# Patient Record
Sex: Male | Born: 1940 | Race: Black or African American | Hispanic: No | Marital: Married | Smoking: Former smoker
Health system: Southern US, Community
[De-identification: ages and names within clinical notes are randomized; demographics above are authoritative.]

## PROBLEM LIST (undated history)

## (undated) DIAGNOSIS — J8410 Pulmonary fibrosis, unspecified: Secondary | ICD-10-CM

## (undated) DIAGNOSIS — I5032 Chronic diastolic (congestive) heart failure: Secondary | ICD-10-CM

## (undated) DIAGNOSIS — Z9981 Dependence on supplemental oxygen: Secondary | ICD-10-CM

## (undated) DIAGNOSIS — E119 Type 2 diabetes mellitus without complications: Secondary | ICD-10-CM

## (undated) DIAGNOSIS — J189 Pneumonia, unspecified organism: Secondary | ICD-10-CM

## (undated) DIAGNOSIS — J449 Chronic obstructive pulmonary disease, unspecified: Secondary | ICD-10-CM

## (undated) DIAGNOSIS — I10 Essential (primary) hypertension: Secondary | ICD-10-CM

## (undated) DIAGNOSIS — E782 Mixed hyperlipidemia: Secondary | ICD-10-CM

## (undated) DIAGNOSIS — I509 Heart failure, unspecified: Secondary | ICD-10-CM

## (undated) DIAGNOSIS — I4891 Unspecified atrial fibrillation: Secondary | ICD-10-CM

## (undated) HISTORY — PX: HERNIA REPAIR: SHX51

## (undated) HISTORY — PX: EYE SURGERY: SHX253

---

## 2013-11-04 ENCOUNTER — Emergency Department (HOSPITAL_COMMUNITY): Payer: Medicare Other

## 2013-11-04 ENCOUNTER — Inpatient Hospital Stay (HOSPITAL_COMMUNITY)
Admission: EM | Admit: 2013-11-04 | Discharge: 2013-11-07 | DRG: 293 | Disposition: A | Discharge status: Home / Self Care | Payer: Medicare Other | Attending: Internal Medicine | Admitting: Internal Medicine

## 2013-11-04 ENCOUNTER — Encounter (HOSPITAL_COMMUNITY): Payer: Self-pay | Admitting: Emergency Medicine

## 2013-11-04 DIAGNOSIS — IMO0001 Reserved for inherently not codable concepts without codable children: Secondary | ICD-10-CM | POA: Diagnosis present

## 2013-11-04 DIAGNOSIS — R0902 Hypoxemia: Secondary | ICD-10-CM | POA: Diagnosis present

## 2013-11-04 DIAGNOSIS — I10 Essential (primary) hypertension: Secondary | ICD-10-CM | POA: Diagnosis present

## 2013-11-04 DIAGNOSIS — I4891 Unspecified atrial fibrillation: Secondary | ICD-10-CM | POA: Diagnosis present

## 2013-11-04 DIAGNOSIS — J811 Chronic pulmonary edema: Secondary | ICD-10-CM | POA: Diagnosis present

## 2013-11-04 DIAGNOSIS — E7800 Pure hypercholesterolemia, unspecified: Secondary | ICD-10-CM | POA: Diagnosis present

## 2013-11-04 DIAGNOSIS — E1165 Type 2 diabetes mellitus with hyperglycemia: Secondary | ICD-10-CM

## 2013-11-04 DIAGNOSIS — E782 Mixed hyperlipidemia: Secondary | ICD-10-CM | POA: Diagnosis present

## 2013-11-04 DIAGNOSIS — I509 Heart failure, unspecified: Secondary | ICD-10-CM | POA: Diagnosis present

## 2013-11-04 DIAGNOSIS — Z833 Family history of diabetes mellitus: Secondary | ICD-10-CM

## 2013-11-04 DIAGNOSIS — I5033 Acute on chronic diastolic (congestive) heart failure: Principal | ICD-10-CM | POA: Diagnosis present

## 2013-11-04 HISTORY — DX: Type 2 diabetes mellitus without complications: E11.9

## 2013-11-04 HISTORY — DX: Unspecified atrial fibrillation: I48.91

## 2013-11-04 HISTORY — DX: Essential (primary) hypertension: I10

## 2013-11-04 HISTORY — DX: Mixed hyperlipidemia: E78.2

## 2013-11-04 LAB — CBC WITH DIFFERENTIAL/PLATELET
BASOS PCT: 0 % (ref 0–1)
Basophils Absolute: 0 10*3/uL (ref 0.0–0.1)
EOS ABS: 0.1 10*3/uL (ref 0.0–0.7)
Eosinophils Relative: 1 % (ref 0–5)
HCT: 44 % (ref 39.0–52.0)
Hemoglobin: 14.9 g/dL (ref 13.0–17.0)
LYMPHS ABS: 1.5 10*3/uL (ref 0.7–4.0)
Lymphocytes Relative: 20 % (ref 12–46)
MCH: 32.5 pg (ref 26.0–34.0)
MCHC: 33.9 g/dL (ref 30.0–36.0)
MCV: 96.1 fL (ref 78.0–100.0)
Monocytes Absolute: 0.5 10*3/uL (ref 0.1–1.0)
Monocytes Relative: 7 % (ref 3–12)
NEUTROS ABS: 5.5 10*3/uL (ref 1.7–7.7)
NEUTROS PCT: 72 % (ref 43–77)
PLATELETS: 162 10*3/uL (ref 150–400)
RBC: 4.58 MIL/uL (ref 4.22–5.81)
RDW: 14.7 % (ref 11.5–15.5)
WBC: 7.6 10*3/uL (ref 4.0–10.5)

## 2013-11-04 LAB — PROTIME-INR
INR: 1.76 — ABNORMAL HIGH (ref 0.00–1.49)
Prothrombin Time: 20 seconds — ABNORMAL HIGH (ref 11.6–15.2)

## 2013-11-04 LAB — COMPREHENSIVE METABOLIC PANEL
ALBUMIN: 3.6 g/dL (ref 3.5–5.2)
ALK PHOS: 65 U/L (ref 39–117)
ALT: 64 U/L — AB (ref 0–53)
AST: 41 U/L — ABNORMAL HIGH (ref 0–37)
BUN: 18 mg/dL (ref 6–23)
CHLORIDE: 101 meq/L (ref 96–112)
CO2: 24 mEq/L (ref 19–32)
Calcium: 9.3 mg/dL (ref 8.4–10.5)
Creatinine, Ser: 1.1 mg/dL (ref 0.50–1.35)
GFR calc Af Amer: 74 mL/min — ABNORMAL LOW (ref >90)
GFR calc non Af Amer: 64 mL/min — ABNORMAL LOW (ref >90)
Glucose, Bld: 113 mg/dL — ABNORMAL HIGH (ref 70–99)
POTASSIUM: 4.2 meq/L (ref 3.7–5.3)
SODIUM: 138 meq/L (ref 137–147)
Total Bilirubin: 1.3 mg/dL — ABNORMAL HIGH (ref 0.3–1.2)
Total Protein: 7.5 g/dL (ref 6.0–8.3)

## 2013-11-04 LAB — URINALYSIS, ROUTINE W REFLEX MICROSCOPIC
BILIRUBIN URINE: NEGATIVE
Glucose, UA: NEGATIVE mg/dL
Ketones, ur: NEGATIVE mg/dL
NITRITE: NEGATIVE
PH: 5.5 (ref 5.0–8.0)
Protein, ur: 30 mg/dL — AB
Specific Gravity, Urine: 1.02 (ref 1.005–1.030)
UROBILINOGEN UA: 0.2 mg/dL (ref 0.0–1.0)

## 2013-11-04 LAB — GLUCOSE, CAPILLARY: GLUCOSE-CAPILLARY: 169 mg/dL — AB (ref 70–99)

## 2013-11-04 LAB — URINE MICROSCOPIC-ADD ON

## 2013-11-04 LAB — PRO B NATRIURETIC PEPTIDE: PRO B NATRI PEPTIDE: 4838 pg/mL — AB (ref 0–125)

## 2013-11-04 LAB — TROPONIN I: Troponin I: <0.3 ng/mL (ref <0.30)

## 2013-11-04 MED ORDER — SODIUM CHLORIDE 0.9 % IJ SOLN
3.0000 mL | Freq: Two times a day (BID) | INTRAMUSCULAR | Status: DC
  Administered 2013-11-05 – 2013-11-07 (×3): 3 mL via INTRAVENOUS

## 2013-11-04 MED ORDER — LATANOPROST 0.005 % OP SOLN
1.0000 [drp] | Freq: Every day | OPHTHALMIC | Status: DC
  Administered 2013-11-04 – 2013-11-06 (×3): 1 [drp] via OPHTHALMIC
  Filled 2013-11-04: qty 2.5

## 2013-11-04 MED ORDER — LATANOPROST 0.005 % OP SOLN
OPHTHALMIC | Status: AC
  Filled 2013-11-04: qty 2.5

## 2013-11-04 MED ORDER — APIXABAN 5 MG PO TABS
5.0000 mg | ORAL_TABLET | Freq: Two times a day (BID) | ORAL | Status: DC
  Administered 2013-11-05 – 2013-11-07 (×5): 5 mg via ORAL
  Filled 2013-11-04 (×5): qty 1

## 2013-11-04 MED ORDER — SODIUM CHLORIDE 0.9 % IJ SOLN: 3.0000 mL | INTRAMUSCULAR | Status: DC | PRN

## 2013-11-04 MED ORDER — FUROSEMIDE 10 MG/ML IJ SOLN
40.0000 mg | Freq: Every day | INTRAMUSCULAR | Status: DC
  Administered 2013-11-05 – 2013-11-06 (×2): 40 mg via INTRAVENOUS
  Filled 2013-11-04 (×2): qty 4

## 2013-11-04 MED ORDER — FUROSEMIDE 10 MG/ML IJ SOLN
80.0000 mg | Freq: Once | INTRAMUSCULAR | Status: AC
  Administered 2013-11-04: 80 mg via INTRAVENOUS
  Filled 2013-11-04: qty 8

## 2013-11-04 MED ORDER — ACETAMINOPHEN 325 MG PO TABS
650.0000 mg | ORAL_TABLET | ORAL | Status: DC | PRN
  Administered 2013-11-06: 650 mg via ORAL
  Filled 2013-11-04: qty 2

## 2013-11-04 MED ORDER — METOPROLOL TARTRATE 25 MG PO TABS
25.0000 mg | ORAL_TABLET | Freq: Two times a day (BID) | ORAL | Status: DC
  Administered 2013-11-05 – 2013-11-07 (×5): 25 mg via ORAL
  Filled 2013-11-04 (×5): qty 1

## 2013-11-04 MED ORDER — ATORVASTATIN CALCIUM 10 MG PO TABS
10.0000 mg | ORAL_TABLET | Freq: Every day | ORAL | Status: DC
  Administered 2013-11-05 – 2013-11-06 (×2): 10 mg via ORAL
  Filled 2013-11-04 (×2): qty 1

## 2013-11-04 MED ORDER — INSULIN ASPART 100 UNIT/ML ~~LOC~~ SOLN
0.0000 [IU] | Freq: Three times a day (TID) | SUBCUTANEOUS | Status: DC
  Administered 2013-11-05: 2 [IU] via SUBCUTANEOUS
  Administered 2013-11-06: 1 [IU] via SUBCUTANEOUS
  Administered 2013-11-06: 2 [IU] via SUBCUTANEOUS
  Administered 2013-11-07 (×2): 1 [IU] via SUBCUTANEOUS

## 2013-11-04 MED ORDER — ASPIRIN EC 81 MG PO TBEC
81.0000 mg | DELAYED_RELEASE_TABLET | Freq: Every day | ORAL | Status: DC
  Administered 2013-11-05 – 2013-11-07 (×3): 81 mg via ORAL
  Filled 2013-11-04 (×3): qty 1

## 2013-11-04 MED ORDER — ENALAPRIL MALEATE 5 MG PO TABS
20.0000 mg | ORAL_TABLET | Freq: Two times a day (BID) | ORAL | Status: DC
  Administered 2013-11-05 – 2013-11-07 (×5): 20 mg via ORAL
  Filled 2013-11-04 (×5): qty 4

## 2013-11-04 MED ORDER — GABAPENTIN 300 MG PO CAPS
900.0000 mg | ORAL_CAPSULE | Freq: Every day | ORAL | Status: DC
  Filled 2013-11-04 (×2): qty 3

## 2013-11-04 MED ORDER — ONDANSETRON HCL 4 MG/2ML IJ SOLN: 4.0000 mg | Freq: Four times a day (QID) | INTRAMUSCULAR | Status: DC | PRN

## 2013-11-04 MED ORDER — SODIUM CHLORIDE 0.9 % IV SOLN: 250.0000 mL | INTRAVENOUS | Status: DC | PRN

## 2013-11-04 MED ORDER — SODIUM CHLORIDE 0.9 % IV SOLN
INTRAVENOUS | Status: DC
  Administered 2013-11-04: 15:00:00 via INTRAVENOUS

## 2013-11-04 NOTE — ED Provider Notes (Addendum)
CSN: [id number]     Arrival date & time 11/04/13  1334 History  This chart was scribed for Shelda Jakes, MD by Danella Maiers, ED Scribe. This patient was seen in room APA19/APA19 and the patient's care was started at 1:49 PM.    Chief Complaint  Patient presents with  . Shortness of Breath   Patient is a 73 y.o. male presenting with shortness of breath. The history is provided by the patient. No language interpreter was used.  Shortness of Breath Severity:  Moderate Onset quality:  Gradual Chronicity:  New Context: activity   Associated symptoms: cough   Associated symptoms: no abdominal pain, no chest pain, no fever, no headaches, no neck pain, no rash, no sore throat and no vomiting    HPI Comments: Draylon Mercadel is a 73 y.o. male who presents to the Emergency Department sent by PCP complaining of increasing SOB and weakness. Pt was treated for pneumonia with Augmentin for 7 days then azithromycin for 5 days. He went to PCP today because he feels tired and has been getting SOB easily, they did a CXR, and sent him here because his ox sats were not above 86% and his CXR looked worse today. He reports productive cough with streaks of blood. He was given a incentive spirometer days ago which has increased production from the cough. He reports feeling like he is wheezing at night. He is on Elaquis. He is not on oxygen at home.     PCP - Dr Ferdie Ping in [place]   Past Medical History  Diagnosis Date  . Hypertension   . Diabetes mellitus without complication   . Atrial fibrillation   . High cholesterol    Past Surgical History  Procedure Laterality Date  . Hernia repair     No family history on file. History  Substance Use Topics  . Smoking status: Never Smoker   . Smokeless tobacco: Not on file  . Alcohol Use: Yes     Comment: occasional    Review of Systems  Constitutional: Negative for fever and chills.  HENT: Positive for rhinorrhea. Negative for sore  throat.   Eyes: Negative for visual disturbance.  Respiratory: Positive for cough and shortness of breath.   Cardiovascular: Negative for chest pain and leg swelling.  Gastrointestinal: Negative for nausea, vomiting, abdominal pain and diarrhea.  Genitourinary: Negative for dysuria and hematuria.  Musculoskeletal: Negative for back pain and neck pain.  Skin: Negative for rash.  Neurological: Negative for headaches.  Hematological: Does not bruise/bleed easily.  Psychiatric/Behavioral: Negative for confusion.  All other systems reviewed and are negative.    Allergies  Review of patient's allergies indicates no known allergies.  Home Medications   Current Outpatient Rx  Name  Route  Sig  Dispense  Refill  . apixaban (ELIQUIS) 5 MG TABS tablet   Oral   Take 5 mg by mouth 2 (two) times daily.         Marland Kitchen aspirin EC 81 MG tablet   Oral   Take 81 mg by mouth daily.         Marland Kitchen atorvastatin (LIPITOR) 20 MG tablet   Oral   Take 10 mg by mouth at bedtime.         . gabapentin (NEURONTIN) 300 MG capsule   Oral   Take 900 mg by mouth at bedtime.         Marland Kitchen latanoprost (XALATAN) 0.005 % ophthalmic solution   Left Eye   Place  1 drop into the left eye at bedtime.         Marland Kitchen lisinopril (PRINIVIL,ZESTRIL) 20 MG tablet   Oral   Take 20 mg by mouth 2 (two) times daily.         . metoprolol (LOPRESSOR) 50 MG tablet   Oral   Take 25 mg by mouth 2 (two) times daily. Takes with Lisinopril         . tadalafil (CIALIS) 20 MG tablet   Oral   Take 20 mg by mouth daily as needed for erectile dysfunction.          BP 149/86  Pulse 78  Temp(Src) 97.5 F (36.4 C)  Resp 22  Ht 5\' 11"  (1.803 m)  Wt 182 lb (82.555 kg)  BMI 25.40 kg/m2  SpO2 92% Physical Exam  Nursing note and vitals reviewed. Constitutional: He is oriented to person, place, and time. He appears well-developed and well-nourished. No distress.  HENT:  Head: Normocephalic and atraumatic.  Eyes: EOM are  normal. Pupils are equal, round, and reactive to light.  Neck: Neck supple. No tracheal deviation present.  Cardiovascular: Normal rate and regular rhythm.   Pulmonary/Chest: Effort normal and breath sounds normal. No respiratory distress.  Abdominal: Soft. Bowel sounds are normal. There is no tenderness.  Musculoskeletal: Normal range of motion.  Neurological: He is alert and oriented to person, place, and time. No cranial nerve deficit.  Skin: Skin is warm and dry.  Psychiatric: He has a normal mood and affect. His behavior is normal.    ED Course  Procedures (including critical care time) Medications  0.9 %  sodium chloride infusion ( Intravenous New Bag/Given 11/04/13 1526)  furosemide (LASIX) injection 80 mg (80 mg Intravenous Given 11/04/13 1555)    DIAGNOSTIC STUDIES: Oxygen Saturation is 92% on RA, adequate by my interpretation.    COORDINATION OF CARE: 2:06 PM- On 2L he is around 96%. Discussed treatment plan with pt. Pt agrees to plan.    Labs Review Labs Reviewed  PROTIME-INR - Abnormal; Notable for the following:    Prothrombin Time 20.0 (*)    INR 1.76 (*)    All other components within normal limits  PRO B NATRIURETIC PEPTIDE - Abnormal; Notable for the following:    Pro B Natriuretic peptide (BNP) 4838.0 (*)    All other components within normal limits  URINALYSIS, ROUTINE W REFLEX MICROSCOPIC - Abnormal; Notable for the following:    Hgb urine dipstick MODERATE (*)    Protein, ur 30 (*)    Leukocytes, UA TRACE (*)    All other components within normal limits  COMPREHENSIVE METABOLIC PANEL - Abnormal; Notable for the following:    Glucose, Bld 113 (*)    AST 41 (*)    ALT 64 (*)    Total Bilirubin 1.3 (*)    GFR calc non Af Amer 64 (*)    GFR calc Af Amer 74 (*)    All other components within normal limits  TROPONIN I  CBC WITH DIFFERENTIAL  URINE MICROSCOPIC-ADD ON   Results for orders placed during the hospital encounter of 11/04/13  PROTIME-INR       Result Value Range   Prothrombin Time 20.0 (*) 11.6 - 15.2 seconds   INR 1.76 (*) 0.00 - 1.49  PRO B NATRIURETIC PEPTIDE      Result Value Range   Pro B Natriuretic peptide (BNP) 4838.0 (*) 0 - 125 pg/mL  TROPONIN I      Result  Value Range   Troponin I <0.30  <0.30 ng/mL  URINALYSIS, ROUTINE W REFLEX MICROSCOPIC      Result Value Range   Color, Urine YELLOW  YELLOW   APPearance CLEAR  CLEAR   Specific Gravity, Urine 1.020  1.005 - 1.030   pH 5.5  5.0 - 8.0   Glucose, UA NEGATIVE  NEGATIVE mg/dL   Hgb urine dipstick MODERATE (*) NEGATIVE   Bilirubin Urine NEGATIVE  NEGATIVE   Ketones, ur NEGATIVE  NEGATIVE mg/dL   Protein, ur 30 (*) NEGATIVE mg/dL   Urobilinogen, UA 0.2  0.0 - 1.0 mg/dL   Nitrite NEGATIVE  NEGATIVE   Leukocytes, UA TRACE (*) NEGATIVE  CBC WITH DIFFERENTIAL      Result Value Range   WBC 7.6  4.0 - 10.5 K/uL   RBC 4.58  4.22 - 5.81 MIL/uL   Hemoglobin 14.9  13.0 - 17.0 g/dL   HCT 16.144.0  09.639.0 - 04.552.0 %   MCV 96.1  78.0 - 100.0 fL   MCH 32.5  26.0 - 34.0 pg   MCHC 33.9  30.0 - 36.0 g/dL   RDW 40.914.7  81.111.5 - 91.415.5 %   Platelets 162  150 - 400 K/uL   Neutrophils Relative % 72  43 - 77 %   Neutro Abs 5.5  1.7 - 7.7 K/uL   Lymphocytes Relative 20  12 - 46 %   Lymphs Abs 1.5  0.7 - 4.0 K/uL   Monocytes Relative 7  3 - 12 %   Monocytes Absolute 0.5  0.1 - 1.0 K/uL   Eosinophils Relative 1  0 - 5 %   Eosinophils Absolute 0.1  0.0 - 0.7 K/uL   Basophils Relative 0  0 - 1 %   Basophils Absolute 0.0  0.0 - 0.1 K/uL  COMPREHENSIVE METABOLIC PANEL      Result Value Range   Sodium 138  137 - 147 mEq/L   Potassium 4.2  3.7 - 5.3 mEq/L   Chloride 101  96 - 112 mEq/L   CO2 24  19 - 32 mEq/L   Glucose, Bld 113 (*) 70 - 99 mg/dL   BUN 18  6 - 23 mg/dL   Creatinine, Ser 7.821.10  0.50 - 1.35 mg/dL   Calcium 9.3  8.4 - 95.610.5 mg/dL   Total Protein 7.5  6.0 - 8.3 g/dL   Albumin 3.6  3.5 - 5.2 g/dL   AST 41 (*) 0 - 37 U/L   ALT 64 (*) 0 - 53 U/L   Alkaline Phosphatase 65  39 -  117 U/L   Total Bilirubin 1.3 (*) 0.3 - 1.2 mg/dL   GFR calc non Af Amer 64 (*) >90 mL/min   GFR calc Af Amer 74 (*) >90 mL/min  URINE MICROSCOPIC-ADD ON      Result Value Range   Squamous Epithelial / LPF RARE  RARE   WBC, UA 3-6  <3 WBC/hpf   RBC / HPF 3-6  <3 RBC/hpf   Bacteria, UA RARE  RARE    Imaging Review Dg Chest Port 1 View  11/04/2013   CLINICAL DATA:  Fatigue.  Chronic, productive cough.  EXAM: PORTABLE CHEST - 1 VIEW  COMPARISON:  PA and lateral chest 11/04/2013 and 10/21/2013.  FINDINGS: Bilateral interstitial opacities have worsened since the most recent study. There is cardiomegaly. There are small bilateral pleural effusions.  IMPRESSION: Worsened aeration most compatible with increased pulmonary edema possibly superimposed on chronic interstitial lung disease.  Electronically Signed   By: Drusilla Kanner M.D.   On: 11/04/2013 15:13    EKG Interpretation   None      Date: 11/04/2013  Rate: 77  Rhythm: atrial fibrillation  QRS Axis: normal  Intervals: normal  ST/T Wave abnormalities: nonspecific ST/T changes  Conduction Disutrbances:none  Narrative Interpretation:   Old EKG Reviewed: none available    MDM   1. Pulmonary edema    The patient sent from a doctor's office and sent in by EMS for persistent exertional shortness of breath and fatigue. Workup consistent with pulmonary edema. A cardiac source patient with long-standing history of hypertension history of atrial fib but controlled. Patient is on the [facility] was for a blood thinner. Patient blood pressure a little marginal but not terrible 149/86. On room air patient was satting in the low 80s. Arrived on 4 L. Able to titrate him down to 2 L satting in the 92% or greater level on that. Patient feels comfortable on the oxygen patient given 80 mg of Lasix with diuresis. Troponin negative EKG without acute cardiac findings. Patient also has a history of diabetes blood sugar well controlled here.   Patient  primary care Dr. had assumed it may be related to pneumonia patient had been on Augmentin and Zithromax. Patient also states he had CT of the chest done in [place]. Results for that not known. Patient with no prior history of CHF or pulmonary edema.   Discussed with hospitalist for admission.  I personally performed the services described in this documentation, which was scribed in my presence. The recorded information has been reviewed and is accurate.     Shelda Jakes, MD 11/04/13 1642  Shelda Jakes, MD 11/04/13 [phone]

## 2013-11-04 NOTE — ED Notes (Signed)
Pt c/o SOB and fatigue "for awhile". Pt states "when I get up to do something I just get really tired". Pt denies chest pain.

## 2013-11-04 NOTE — ED Notes (Signed)
Pt sent by pcp increasing sob and weakness. Pt treated for pne with po augmentin and azithromycin with no relief and worsening chest xray.

## 2013-11-04 NOTE — H&P (Signed)
[facility] History and Physical  [name] Sherman NWG:[id number] DOB: 1941-01-16 DOA: 11/04/2013  Referring physician: EDP PCP: [name], PA-C   Chief Complaint: easily fatigued on exertion.  HPI: [name] Sherman is a 73 y.o. male with prior h/o hypertension, DM, atrial fib on eliquis, came in for some sob, and easy fatigability on mild exertion. On arrival to ED, he was found to have pulm edema and hypoxic. Marland Kitchen He waas given 80 mg of IV lasix and referred to hospitalist service for admission.  He denies any other complaints.    Review of Systems:  Constitutional:  No weight loss, night sweats, Fevers, chills, fatigue.  HEENT:  No headaches, Difficulty swallowing,Tooth/dental problems,Sore throat,  No sneezing, itching, ear ache, nasal congestion, post nasal drip,  Cardio-vascular:  No chest pain, Orthopnea, PND, swelling in lower extremities, anasarca, dizziness, palpitations  GI:  No heartburn, indigestion, abdominal pain, nausea, vomiting, diarrhea, change in bowel habits, loss of appetite  Resp:  Positive for SOB on exertion.  No excess mucus, no productive cough, No non-productive cough, No coughing up of blood.No change in color of mucus.No wheezing.No chest wall deformity  Skin:  no rash or lesions.  GU:  no dysuria, change in color of urine, no urgency or frequency. No flank pain.  Musculoskeletal:  No joint pain or swelling. No decreased range of motion. No back pain.  Psych:  No change in mood or affect. No depression or anxiety. No memory loss.   Past Medical History  Diagnosis Date  . Hypertension   . Diabetes mellitus without complication   . Atrial fibrillation   . High cholesterol    Past Surgical History  Procedure Laterality Date  . Hernia repair     Social History:  reports that he has never smoked. He does not have any smokeless tobacco history on file. He reports that he drinks alcohol. He reports that he does not use illicit drugs.  No Known  Allergies  No family history on file.   Prior to Admission medications   Medication Sig Start Date End Date Taking? Authorizing Provider  apixaban (ELIQUIS) 5 MG TABS tablet Take 5 mg by mouth 2 (two) times daily.   Yes Historical Provider, MD  aspirin EC 81 MG tablet Take 81 mg by mouth daily.   Yes Historical Provider, MD  atorvastatin (LIPITOR) 20 MG tablet Take 10 mg by mouth at bedtime.   Yes Historical Provider, MD  gabapentin (NEURONTIN) 300 MG capsule Take 900 mg by mouth at bedtime.   Yes Historical Provider, MD  latanoprost (XALATAN) 0.005 % ophthalmic solution Place 1 drop into the left eye at bedtime.   Yes Historical Provider, MD  lisinopril (PRINIVIL,ZESTRIL) 20 MG tablet Take 20 mg by mouth 2 (two) times daily.   Yes Historical Provider, MD  metoprolol (LOPRESSOR) 50 MG tablet Take 25 mg by mouth 2 (two) times daily. Takes with Lisinopril   Yes Historical Provider, MD  tadalafil (CIALIS) 20 MG tablet Take 20 mg by mouth daily as needed for erectile dysfunction.   Yes Historical Provider, MD   Physical Exam: Filed Vitals:   11/04/13 1555  BP: 149/97  Pulse: 69  Temp:   Resp: 19    BP 149/97  Pulse 69  Temp(Src) 97.5 F (36.4 C)  Resp 19  Ht 5\' 11"  (1.803 m)  Wt 82.555 kg (182 lb)  BMI 25.40 kg/m2  SpO2 95%  General:  Appears calm and comfortable Eyes: PERRL, normal lids, irises & conjunctiva  ENT: grossly normal hearing, lips & tongue Neck: no LAD, masses or thyromegaly Cardiovascular: RRR, no m/r/g. No LE edema. Respiratory: CTA bilaterally, no w/r/r. Normal respiratory effort. Abdomen: soft, ntnd Skin: no rash or induration seen on limited exam Musculoskeletal: grossly normal tone BUE/BLE Psychiatric: grossly normal mood and affect, speech fluent and appropriate Neurologic: grossly non-focal.          Labs on Admission:  Basic Metabolic Panel:  Recent Labs Lab 11/04/13 1457  NA 138  K 4.2  CL 101  CO2 24  GLUCOSE 113*  BUN 18  CREATININE 1.10   CALCIUM 9.3   Liver Function Tests:  Recent Labs Lab 11/04/13 1457  AST 41*  ALT 64*  ALKPHOS 65  BILITOT 1.3*  PROT 7.5  ALBUMIN 3.6   No results found for this basename: LIPASE, AMYLASE,  in the last 168 hours No results found for this basename: AMMONIA,  in the last 168 hours CBC:  Recent Labs Lab 11/04/13 1457  WBC 7.6  NEUTROABS 5.5  HGB 14.9  HCT 44.0  MCV 96.1  PLT 162   Cardiac Enzymes:  Recent Labs Lab 11/04/13 1457  TROPONINI <0.30    BNP (last 3 results)  Recent Labs  11/04/13 1457  PROBNP 4838.0*   CBG: No results found for this basename: GLUCAP,  in the last 168 hours  Radiological Exams on Admission: Dg Chest Port 1 View  11/04/2013   CLINICAL DATA:  Fatigue.  Chronic, productive cough.  EXAM: PORTABLE CHEST - 1 VIEW  COMPARISON:  PA and lateral chest 11/04/2013 and 10/21/2013.  FINDINGS: Bilateral interstitial opacities have worsened since the most recent study. There is cardiomegaly. There are small bilateral pleural effusions.  IMPRESSION: Worsened aeration most compatible with increased pulmonary edema possibly superimposed on chronic interstitial lung disease.   Electronically Signed   By: Drusilla Kannerhomas  Dalessio M.D.   On: 11/04/2013 15:13    EKG: afib at 77 with t wave inversions.  Assessment/Plan Active Problems:   Pulmonary edema   CHF (congestive heart failure)  1. Pulmonary edema; of unclear etiology / easy fatigability: . Elevated pro bnp of 4838.  - ADMITTED to telemetry - evaluate for CHF, echo, serial troponins, EKG in am.  - EKG shows chronic atrial fib , rate controlled.  - if echo abn will call cardiology in am.  - oxygen to keep sats>90%  2. Chronic atrial fibrillation: - rate controlled - on eliquis.   3. Diabetes Mellitus: - diet controlled.  - hgba1c  SSI  4.  Hypertension: - controlled.  - resume metoprolol, lisinopril.    DVT prophylaxis On eliquis.   Code Status: full code Family Communication: none  atbedside, wife in SNF, discussed the plan of care with th epatient.  Disposition Plan: pending.   Time spent: 65 min  [facility] County[facility]UL[facility],Adel Neyer [facility] Pager [phone]

## 2013-11-05 ENCOUNTER — Inpatient Hospital Stay (HOSPITAL_COMMUNITY): Payer: Medicare Other

## 2013-11-05 DIAGNOSIS — I4891 Unspecified atrial fibrillation: Secondary | ICD-10-CM

## 2013-11-05 DIAGNOSIS — I10 Essential (primary) hypertension: Secondary | ICD-10-CM

## 2013-11-05 DIAGNOSIS — I359 Nonrheumatic aortic valve disorder, unspecified: Secondary | ICD-10-CM

## 2013-11-05 LAB — GLUCOSE, CAPILLARY
GLUCOSE-CAPILLARY: 84 mg/dL (ref 70–99)
GLUCOSE-CAPILLARY: 133 mg/dL — AB (ref 70–99)
Glucose-Capillary: 120 mg/dL — ABNORMAL HIGH (ref 70–99)
Glucose-Capillary: 174 mg/dL — ABNORMAL HIGH (ref 70–99)

## 2013-11-05 LAB — BASIC METABOLIC PANEL
BUN: 21 mg/dL (ref 6–23)
CO2: 26 meq/L (ref 19–32)
Calcium: 9.3 mg/dL (ref 8.4–10.5)
Chloride: 100 mEq/L (ref 96–112)
Creatinine, Ser: 1.16 mg/dL (ref 0.50–1.35)
GFR calc Af Amer: 70 mL/min — ABNORMAL LOW (ref >90)
GFR calc non Af Amer: 60 mL/min — ABNORMAL LOW (ref >90)
Glucose, Bld: 113 mg/dL — ABNORMAL HIGH (ref 70–99)
POTASSIUM: 4.2 meq/L (ref 3.7–5.3)
SODIUM: 139 meq/L (ref 137–147)

## 2013-11-05 LAB — CBC
HEMATOCRIT: 44.2 % (ref 39.0–52.0)
Hemoglobin: 15.2 g/dL (ref 13.0–17.0)
MCH: 33.2 pg (ref 26.0–34.0)
MCHC: 34.4 g/dL (ref 30.0–36.0)
MCV: 96.5 fL (ref 78.0–100.0)
Platelets: 166 10*3/uL (ref 150–400)
RBC: 4.58 MIL/uL (ref 4.22–5.81)
RDW: 14.7 % (ref 11.5–15.5)
WBC: 6.2 10*3/uL (ref 4.0–10.5)

## 2013-11-05 LAB — TROPONIN I
Troponin I: <0.3 ng/mL (ref <0.30)
Troponin I: <0.3 ng/mL (ref <0.30)

## 2013-11-05 LAB — HEMOGLOBIN A1C
HEMOGLOBIN A1C: 6.2 % — AB (ref <5.7)
MEAN PLASMA GLUCOSE: 131 mg/dL — AB (ref <117)

## 2013-11-05 LAB — PRO B NATRIURETIC PEPTIDE: Pro B Natriuretic peptide (BNP): 2570 pg/mL — ABNORMAL HIGH (ref 0–125)

## 2013-11-05 NOTE — Progress Notes (Signed)
Notified MD of changes in EKG.  Will continue to monitor patient.

## 2013-11-05 NOTE — Progress Notes (Signed)
UR chart review completed.  

## 2013-11-05 NOTE — Progress Notes (Signed)
*  PRELIMINARY RESULTS* Echocardiogram 2D Echocardiogram has been performed.  Gurkirat Basher 11/05/2013, 4:59 PM

## 2013-11-05 NOTE — Progress Notes (Signed)
[facility] PROGRESS NOTE  [name] Sherman ZOX:[id number] DOB: Aug 06, 1941 DOA: 11/04/2013 PCP: [name], PA-C  Assessment/Plan: 1. Pulmonary edema; of unclear etiology / easy fatigability: . Elevated pro bnp of 4838 has decreased to 2570 - ADMITTED to telemetry  - evaluate for CHF, echo, serial troponins negative, EKG in am shows t wave inversions in the anterior leads.  - EKG shows chronic atrial fib , rate controlled.  - if echo abn will call cardiology in am.  - oxygen to keep sats>90%  2. Chronic atrial fibrillation:  - rate controlled  - on eliquis.  3. Diabetes Mellitus:  - diet controlled.  - hgba1c pending.  SSI  4. Hypertension:  - controlled.  - resume metoprolol, lisinopril.  DVT prophylaxis  On eliquis.  Code Status: full code  Family Communication: none atbedside, wife in SNF, discussed the plan of care with th epatient.  Disposition Plan: pending.       Consultants:  None  Procedures:  Echocardiogram pending.   Antibiotics:  none  HPI/Subjective: Still feel tired, no chest pain or sob,.   Objective: Filed Vitals:   11/05/13 0545  BP: 140/71  Pulse: 58  Temp: 97.7 F (36.5 C)  Resp: 18    Intake/Output Summary (Last 24 hours) at 11/05/13 1351 Last data filed at 11/05/13 1200  Gross per 24 hour  Intake    480 ml  Output    825 ml  Net   -345 ml   Filed Weights   11/04/13 1345 11/04/13 1823 11/05/13 0545  Weight: 82.555 kg (182 lb) 75.751 kg (167 lb) 75.2 kg (165 lb 12.6 oz)    Exam:   General:  Alert afebrile comfortable  Cardiovascular: s1s2  Respiratory: ctab  Abdomen: soft NT ND BS+  Musculoskeletal: no pedal edema.   Data Reviewed: Basic Metabolic Panel:  Recent Labs Lab 11/04/13 1457 11/05/13 0458  NA 138 139  K 4.2 4.2  CL 101 100  CO2 24 26  GLUCOSE 113* 113*  BUN 18 21  CREATININE 1.10 1.16  CALCIUM 9.3 9.3   Liver Function Tests:  Recent Labs Lab 11/04/13 1457  AST 41*  ALT 64*   ALKPHOS 65  BILITOT 1.3*  PROT 7.5  ALBUMIN 3.6   No results found for this basename: LIPASE, AMYLASE,  in the last 168 hours No results found for this basename: AMMONIA,  in the last 168 hours CBC:  Recent Labs Lab 11/04/13 1457 11/05/13 0458  WBC 7.6 6.2  NEUTROABS 5.5  --   HGB 14.9 15.2  HCT 44.0 44.2  MCV 96.1 96.5  PLT 162 166   Cardiac Enzymes:  Recent Labs Lab 11/04/13 1457 11/04/13 1759 11/05/13 0027 11/05/13 0458  TROPONINI <0.30 <0.30 <0.30 <0.30   BNP (last 3 results)  Recent Labs  11/04/13 1457 11/05/13 0456  PROBNP 4838.0* 2570.0*   CBG:  Recent Labs Lab 11/04/13 2122 11/05/13 0714 11/05/13 1143  GLUCAP 169* 120* 174*    No results found for this or any previous visit (from the past 240 hour(s)).   Studies: Dg Chest Port 1 View  11/04/2013   CLINICAL DATA:  Fatigue.  Chronic, productive cough.  EXAM: PORTABLE CHEST - 1 VIEW  COMPARISON:  PA and lateral chest 11/04/2013 and 10/21/2013.  FINDINGS: Bilateral interstitial opacities have worsened since the most recent study. There is cardiomegaly. There are small bilateral pleural effusions.  IMPRESSION: Worsened aeration most compatible with increased pulmonary edema possibly superimposed on chronic interstitial lung disease.  Electronically Signed   By: Drusilla Kannerhomas  Dalessio M.D.   On: 11/04/2013 15:13    Scheduled Meds: . apixaban  5 mg Oral BID  . aspirin EC  81 mg Oral Daily  . atorvastatin  10 mg Oral QHS  . enalapril  20 mg Oral BID  . furosemide  40 mg Intravenous Daily  . gabapentin  900 mg Oral QHS  . insulin aspart  0-9 Units Subcutaneous TID WC  . latanoprost  1 drop Left Eye QHS  . metoprolol  25 mg Oral BID  . sodium chloride  3 mL Intravenous Q12H   Continuous Infusions:   Active Problems:   Pulmonary edema   CHF (congestive heart failure)   Hypertension   Atrial fibrillation   Type II or unspecified type diabetes mellitus without mention of complication,  uncontrolled    Time spent: 30 min    Janelle Culton  [facility] Pager [phone]. If 7PM-7AM, please contact night-coverage at [email or web address], password [email or web address] 11/05/2013, 1:51 PM  LOS: 1 day

## 2013-11-05 NOTE — Care Management Note (Addendum)
    Page 1 of 1   06/17/2014     1:31:15 PM CARE MANAGEMENT NOTE 06/17/2014  Patient:  Hartford PoliGWYNN,Yacine   Account Number:  [id number]  Date Initiated:  11/05/2013  Documentation initiated by:  Sharrie RothmanBLACKWELL,Neenah Canter C  Subjective/Objective Assessment:   Pt admitted from home with CHF. Pt lives alone (wife is in nursing home in [place]) and will return home at discharge. Pt is independent with ADL's and still drives to [place] to see his wife.     Action/Plan:   No CM needs noted.   Anticipated DC Date:  11/07/2013   Anticipated DC Plan:  HOME/SELF CARE      DC Planning Services  CM consult      Choice offered to / List presented to:             Status of service:  Completed, signed off Medicare Important Message given?   (If response is "NO", the following Medicare IM given date fields will be blank) Date Medicare IM given:   Medicare IM given by:   Date Additional Medicare IM given:   Additional Medicare IM given by:    Discharge Disposition:  HOME/SELF CARE  Per UR Regulation:    If discussed at Long Length of Stay Meetings, dates discussed:    Comments:  11/05/13 1405 Arlyss Queenammy Katya Rolston, RN BSN CM

## 2013-11-06 ENCOUNTER — Encounter (HOSPITAL_COMMUNITY): Payer: Self-pay | Admitting: Cardiology

## 2013-11-06 ENCOUNTER — Inpatient Hospital Stay (HOSPITAL_COMMUNITY): Payer: Medicare Other

## 2013-11-06 DIAGNOSIS — I5033 Acute on chronic diastolic (congestive) heart failure: Principal | ICD-10-CM

## 2013-11-06 DIAGNOSIS — R0602 Shortness of breath: Secondary | ICD-10-CM

## 2013-11-06 LAB — GLUCOSE, CAPILLARY
GLUCOSE-CAPILLARY: 176 mg/dL — AB (ref 70–99)
Glucose-Capillary: 152 mg/dL — ABNORMAL HIGH (ref 70–99)
Glucose-Capillary: 128 mg/dL — ABNORMAL HIGH (ref 70–99)
Glucose-Capillary: 106 mg/dL — ABNORMAL HIGH (ref 70–99)

## 2013-11-06 LAB — BASIC METABOLIC PANEL
BUN: 26 mg/dL — AB (ref 6–23)
CHLORIDE: 98 meq/L (ref 96–112)
CO2: 26 meq/L (ref 19–32)
Calcium: 9.4 mg/dL (ref 8.4–10.5)
Creatinine, Ser: 1.1 mg/dL (ref 0.50–1.35)
GFR calc non Af Amer: 64 mL/min — ABNORMAL LOW (ref >90)
GFR, EST AFRICAN AMERICAN: 74 mL/min — AB (ref >90)
Glucose, Bld: 124 mg/dL — ABNORMAL HIGH (ref 70–99)
POTASSIUM: 4.4 meq/L (ref 3.7–5.3)
Sodium: 137 mEq/L (ref 137–147)

## 2013-11-06 MED ORDER — FUROSEMIDE 40 MG PO TABS
40.0000 mg | ORAL_TABLET | Freq: Every day | ORAL | Status: DC
  Administered 2013-11-07: 40 mg via ORAL
  Filled 2013-11-06: qty 1

## 2013-11-06 NOTE — Evaluation (Signed)
Physical Therapy Evaluation Patient Details Name: [name] Sherman MRN: [id number] DOB: 01/12/1941 Today's Date: 11/06/2013 Time: [id number] PT Time Calculation (min): 28 min  PT Assessment / Plan / Recommendation History of Present Illness  Pt is admitted with CHF/pulmonary edema.  He has a hx of Afib, HTN, DM and c/o dyspnea at admission.  Pt is currently living alone and is totally independent at home.  Clinical Impression   Pt was seen for evaluation.  He is feeling well today with no dyspnea on RA.  His strength and balance are totally WNL. No functional deficits are seen.  Pt states that he feels much more energetic at this time.    PT Assessment  Patent does not need any further PT services    Follow Up Recommendations  No PT follow up    Does the patient have the potential to tolerate intense rehabilitation      Barriers to Discharge        Equipment Recommendations  None recommended by PT    Recommendations for Other Services     Frequency      Precautions / Restrictions Precautions Precautions: None Restrictions Weight Bearing Restrictions: No   Pertinent Vitals/Pain       Mobility  Bed Mobility Overal bed mobility: Independent Ambulation/Gait Ambulation/Gait assistance: Independent Ambulation Distance (Feet): 200 Feet Assistive device: None Gait Pattern/deviations: WFL(Within Functional Limits) Gait velocity: WNL    Exercises     PT Diagnosis:    PT Problem List:   PT Treatment Interventions:       PT Goals(Current goals can be found in the care plan section) Acute Rehab PT Goals PT Goal Formulation: No goals set, d/c therapy  Visit Information  Last PT Received On: 11/06/13 History of Present Illness: Pt is admitted with CHF/pulmonary edema.  He has a hx of Afib, HTN, DM and c/o dyspnea at admission.  Pt is currently living alone and is totally independent at home.       Prior Functioning  Home Living Family/patient expects to be discharged  to:: Private residence Living Arrangements: Alone Type of Home: House Home Access: Stairs to enter Secretary/administratorntrance Stairs-Number of Steps: 2 Entrance Stairs-Rails: Right Home Layout: Laundry or work area in [employer] Mutual Insurancebasement Home Equipment: None Prior Function Level of Independence: Independent Communication Communication: No difficulties    Cognition  Cognition Arousal/Alertness: Awake/alert Behavior During Therapy: WFL for tasks assessed/performed Overall Cognitive Status: Within Functional Limits for tasks assessed    Extremity/Trunk Assessment Lower Extremity Assessment Lower Extremity Assessment: Overall WFL for tasks assessed   Balance Balance Overall balance assessment: Independent  End of Session PT - End of Session Equipment Utilized During Treatment: Gait belt Activity Tolerance: Patient tolerated treatment well Patient left: in chair;with call bell/phone within reach;with family/visitor present Nurse Communication: Mobility status  GP     [name] Sherman, [name] Sherman 11/06/2013, 12:12 PM

## 2013-11-06 NOTE — Consult Note (Signed)
Primary cardiologist: Dr. Clent Sherman, [place] Consulting cardiologist: Dr. Jonelle Sherman  Clinical Summary [name] Sherman is a 73 y.o.male admitted to the hospital with shortness of breath noted over the last several weeks to months, reportedly recent episode of "pneumonia" managed with outpatient antibiotics about 3 weeks ago. He does not endorse any palpitations or chest pain. Has had intermittent productive cough, no fevers or chills. He was admitted with concerns about heart failure symptoms, chest x-ray demonstrating possible pulmonary edema versus chronic interstitial lung disease pattern.  He has a history of atrial fibrillation managed by his cardiologist Dr. Earna Sherman, on Eliquis and metoprolol. Heart rate has been well-controlled during hospital observation. Cardiac markers argue against ACS. Echocardiogram, noted below, shows LVEF 55-60% with grade 2 diastolic dysfunction, moderate biatrial enlargement, mild mitral and aortic regurgitation. Also RV dilatation with mildly reduced function and PASP 42 mm mercury.  He states he feels better today. He has been treated with IV Lasix, only mild diuresis noted. Pro BNP was increased to 2570.  He does not endorse any known history of chronic lung disease. Also states that he has undergone previous stress testing and has no known history of obstructive CAD or myocardial infarction.   No Known Allergies  Medications Scheduled Medications: . apixaban  5 mg Oral BID  . aspirin EC  81 mg Oral Daily  . atorvastatin  10 mg Oral QHS  . enalapril  20 mg Oral BID  . furosemide  40 mg Intravenous Daily  . gabapentin  900 mg Oral QHS  . insulin aspart  0-9 Units Subcutaneous TID WC  . latanoprost  1 drop Left Eye QHS  . metoprolol  25 mg Oral BID  . sodium chloride  3 mL Intravenous Q12H     PRN Medications: sodium chloride, acetaminophen, ondansetron (ZOFRAN) IV, sodium chloride   Past Medical History  Diagnosis Date  . Essential  hypertension, benign   . Type 2 diabetes mellitus   . Atrial fibrillation     Dr. Earna Sherman [facility]  . Mixed hyperlipidemia     Past Surgical History  Procedure Laterality Date  . Hernia repair      Family History  Problem Relation Age of Onset  . Diabetes Mellitus II Mother   . Diabetes Mellitus II Sister     Social History [name] Sherman reports that he has never smoked. He does not have any smokeless tobacco history on file. [name] Sherman reports that he drinks alcohol.  Review of Systems No regular sense of palpitations, no dizziness or syncope. No reported bleeding episodes on anticoagulant. No orthopnea or PND. Probably chronic dyspnea or exertion. No chest pain. Otherwise negative except as outlined.  Physical Examination Blood pressure 130/84, pulse 58, temperature 97.4 F (36.3 C), temperature source Oral, resp. rate 18, height 5\' 11"  (1.803 m), weight 166 lb 11.2 oz (75.615 kg), SpO2 98.00%.  Intake/Output Summary (Last 24 hours) at 11/06/13 0939 Last data filed at 11/06/13 0800  Gross per 24 hour  Intake    360 ml  Output   1000 ml  Net   -640 ml    The patient appears comfortable at rest. HEENT: Conjunctiva and lids normal, oropharynx clear. Neck: Supple, no elevated JVP or carotid bruits, no thyromegaly. Lungs: Coarse breath sounds with scattered rhonchi, no wheezing,, nonlabored breathing at rest. Cardiac: Irregularly irregular, no S3, soft systolic murmur, no pericardial rub. Abdomen: Soft, nontender, bowel sounds present, no guarding or rebound. Extremities: No pitting edema, distal  pulses 2+. Skin: Warm and dry. Musculoskeletal: No kyphosis. Neuropsychiatric: Alert and oriented x3, affect grossly appropriate.   Lab Results  Basic Metabolic Panel:  Recent Labs Lab 11/04/13 1457 11/05/13 0458 11/06/13 0505  NA 138 139 137  K 4.2 4.2 4.4  CL 101 100 98  CO2 24 26 26   GLUCOSE 113* 113* 124*  BUN 18 21 26*  CREATININE 1.10 1.16 1.10  CALCIUM 9.3  9.3 9.4    Liver Function Tests:  Recent Labs Lab 11/04/13 1457  AST 41*  ALT 64*  ALKPHOS 65  BILITOT 1.3*  PROT 7.5  ALBUMIN 3.6    CBC:  Recent Labs Lab 11/04/13 1457 11/05/13 0458  WBC 7.6 6.2  NEUTROABS 5.5  --   HGB 14.9 15.2  HCT 44.0 44.2  MCV 96.1 96.5  PLT 162 166    Cardiac Enzymes:  Recent Labs Lab 11/04/13 1457 11/04/13 1759 11/05/13 0027 11/05/13 0458  TROPONINI <0.30 <0.30 <0.30 <0.30    Pro-BNP: 2570  ECG Atrial fibrillation with nonspecific ST-T changes.  Echocardiogram (1/22): Study Conclusions  - Left ventricle: The cavity size was normal. Wall thickness was increased in a pattern of mild LVH. Systolic function was normal. The estimated ejection fraction was in the range of 55% to 60%. Wall motion was normal; there were no regional wall motion abnormalities. Features are consistent with a pseudonormal left ventricular filling pattern, with concomitant abnormal relaxation and increased filling pressure (grade 2 diastolic dysfunction). - Aortic valve: Mildly calcified annulus. Trileaflet. Mild regurgitation. Mean gradient: 2mm Hg (S). - Aortic arch: The aortic arch was moderately calcified; it had moderate diffuse disease. - Mitral valve: Calcified annulus. Mildly thickened leaflets . Mild regurgitation. - Left atrium: The atrium was moderately dilated. - Right ventricle: The cavity size was mildly dilated. Systolic function was mildly reduced. - Right atrium: The atrium was moderately dilated. Central venous pressure: 3mm Hg (est). - Tricuspid valve: Mild regurgitation. - Pulmonary arteries: PA peak pressure: 42mm Hg (S). - Pericardium, extracardiac: There was no pericardial Effusion.  Impressions:  - Mild LVH with LVEF 55-60%, grade 2 diastolic dysfunction. Moderate biatrial enlargement. MAC with mild mitral regurgitation. Mild aortic regurgitation. Moderately calcified aortic arch with atherosclerosis. Mild  RV dilatation with mildy reduced contraction. Mild tricuspid regurgitation with PASP 42 mmHg.   Imaging PORTABLE CHEST - 1 VIEW  COMPARISON: PA and lateral chest 11/04/2013 and 10/21/2013.  FINDINGS: Bilateral interstitial opacities have worsened since the most recent study. There is cardiomegaly. There are small bilateral pleural effusions.  IMPRESSION: Worsened aeration most compatible with increased pulmonary edema possibly superimposed on chronic interstitial lung disease.   Impression  1. Presentation with shortness of breath and intermittent cough as outlined above. Suspect mixed etiology. Could have an element of acute on chronic diastolic heart failure, but would also at least be suspicious for a component of chronic lung disease, recent reported "pneumonia." Echocardiogram demonstrates preserved LVEF with grade 2 diastolic dysfunction. Also has some RV dysfunction with moderately increased pulmonary pressures. No valvular disease to explain this increase in pulmonary pressure.  2. Presumably chronic atrial fibrillation, managed with strategy of heart rate control and anticoagulation by his cardiologist Dr. Earna Sherman in [place], [place]. Good heart rate control noted now.  3. History of hypertension.  4. History of type 2 diabetes mellitus.   Recommendations  No further cardiac testing anticipated at this time. Would recommend continuing Eliquis, Vasotec, and Lopressor. Consider stopping aspirin since he is on concurrent anticoagulant to reduce risk of  bleeding. Heart rate is adequately controlled in atrial fibrillation. I suspect that he may benefit from a low-dose diuretic as an outpatient given possibility of symptomatic diastolic dysfunction. Could consider HCTZ with potassium supplement as a first step. He needs to have follow up with his primary care provider within a few weeks of discharge. Consider followup pulmonary function tests. Cardiology followup can continue with Dr.  Earna CoderZachary.   [name] SidleSamuel G. Joelle Roswell, M.D., F.A.C.C.

## 2013-11-06 NOTE — Progress Notes (Signed)
[facility] PROGRESS NOTE  [name] Sherman WUJ:[id number] DOB: 19-Dec-1940 DOA: 11/04/2013 PCP: [name], PA-C  Assessment/Plan: 1. Pulmonary edema; of unclear etiology / easy fatigability: . Elevated pro bnp of 4838 has decreased to 2570 - ADMITTED to telemetry  - evaluate for CHF, echo, serial troponins negative, EKG in am shows t wave inversions in the anterior leads.  - EKG shows chronic atrial fib , rate controlled.  - echo shows diastolic dysfunction, cardiology consulted, recommended outpatient pulmonary function tests.  - oxygen to keep sats>90%  2. Chronic atrial fibrillation:  - rate controlled  - on eliquis.  3. Diabetes Mellitus:  - diet controlled.  - hgba1c pending.  SSI  4. Hypertension:  - controlled.  - resume metoprolol, lisinopril.  DVT prophylaxis  On eliquis.  Code Status: full code  Family Communication: none atbedside, wife in SNF, discussed the plan of care with th epatient.  Disposition Plan: pending.       Consultants:  None  Procedures:  Echocardiogram   Antibiotics:  none  HPI/Subjective: Still feel tired, no chest pain or sob,.   Objective: Filed Vitals:   11/06/13 0914  BP: 130/84  Pulse:   Temp:   Resp:     Intake/Output Summary (Last 24 hours) at 11/06/13 1439 Last data filed at 11/06/13 1235  Gross per 24 hour  Intake    840 ml  Output   1200 ml  Net   -360 ml   Filed Weights   11/04/13 1823 11/05/13 0545 11/06/13 0453  Weight: 75.751 kg (167 lb) 75.2 kg (165 lb 12.6 oz) 75.615 kg (166 lb 11.2 oz)    Exam:   General:  Alert afebrile comfortable  Cardiovascular: s1s2  Respiratory: ctab  Abdomen: soft NT ND BS+  Musculoskeletal: no pedal edema.   Data Reviewed: Basic Metabolic Panel:  Recent Labs Lab 11/04/13 1457 11/05/13 0458 11/06/13 0505  NA 138 139 137  K 4.2 4.2 4.4  CL 101 100 98  CO2 24 26 26   GLUCOSE 113* 113* 124*  BUN 18 21 26*  CREATININE 1.10 1.16 1.10  CALCIUM 9.3  9.3 9.4   Liver Function Tests:  Recent Labs Lab 11/04/13 1457  AST 41*  ALT 64*  ALKPHOS 65  BILITOT 1.3*  PROT 7.5  ALBUMIN 3.6   No results found for this basename: LIPASE, AMYLASE,  in the last 168 hours No results found for this basename: AMMONIA,  in the last 168 hours CBC:  Recent Labs Lab 11/04/13 1457 11/05/13 0458  WBC 7.6 6.2  NEUTROABS 5.5  --   HGB 14.9 15.2  HCT 44.0 44.2  MCV 96.1 96.5  PLT 162 166   Cardiac Enzymes:  Recent Labs Lab 11/04/13 1457 11/04/13 1759 11/05/13 0027 11/05/13 0458  TROPONINI <0.30 <0.30 <0.30 <0.30   BNP (last 3 results)  Recent Labs  11/04/13 1457 11/05/13 0456  PROBNP 4838.0* 2570.0*   CBG:  Recent Labs Lab 11/05/13 1143 11/05/13 1705 11/05/13 2203 11/06/13 0724 11/06/13 1141  GLUCAP 174* 84 133* 152* 128*    No results found for this or any previous visit (from the past 240 hour(s)).   Studies: Dg Chest 2 View  11/06/2013   CLINICAL DATA:  Followup pulmonary edema  EXAM: CHEST  2 VIEW  COMPARISON:  11/04/2013  FINDINGS: Cardiomediastinal silhouette is stable. There is improvement in aeration without convincing pulmonary edema. Persistent residual peripheral interstitial prominence highly suspicious for fibrotic changes or chronic interstitial lung disease. No segmental infiltrate.  IMPRESSION: Mild spurring of radial head. No posterior fat pad sign. There is subtle lucent line at the base of coronoid process of the ulna suspicious for nondisplaced fracture. Clinical correlation is necessary.   Electronically Signed   By: Natasha MeadLiviu  Pop M.D.   On: 11/06/2013 13:59   [place] Abdomen Complete  11/05/2013   CLINICAL DATA:  Elevated liver function tests  EXAM: ULTRASOUND ABDOMEN COMPLETE  COMPARISON:  None.  FINDINGS: Gallbladder:  No gallstones or wall thickening visualized. No sonographic Murphy sign noted.  Common bile duct:  Diameter: 5.5 mm in caliber.  Liver:  Diffusely increased in echogenicity.  No focal mass.   IVC:  Limited visualization.  Grossly patent.  Pancreas:  Obscured by overlying bowel gas.  Spleen:  Size and appearance within normal limits.  Right Kidney:  Length: 11.1 cm in length. Echogenicity within normal limits. No mass or hydronephrosis visualized.  Left Kidney:  Length: 10.5 cm in length. Echogenicity within normal limits. No mass or hydronephrosis visualized.  Abdominal aorta:  Portions were obscured by overlying bowel gas.  No obvious aneurysm.  Other findings:  None.  IMPRESSION: Diffusely increased echogenicity throughout the liver likely due to diffuse hepatic steatosis. Diffuse hepatic parenchymal disease can have a similar appearance.  Limited visualization of the pancreas, IVC, and aorta.   Electronically Signed   By: Maryclare BeanArt  Hoss M.D.   On: 11/05/2013 15:35   Dg Chest Port 1 View  11/04/2013   CLINICAL DATA:  Fatigue.  Chronic, productive cough.  EXAM: PORTABLE CHEST - 1 VIEW  COMPARISON:  PA and lateral chest 11/04/2013 and 10/21/2013.  FINDINGS: Bilateral interstitial opacities have worsened since the most recent study. There is cardiomegaly. There are small bilateral pleural effusions.  IMPRESSION: Worsened aeration most compatible with increased pulmonary edema possibly superimposed on chronic interstitial lung disease.   Electronically Signed   By: Drusilla Kannerhomas  Dalessio M.D.   On: 11/04/2013 15:13    Scheduled Meds: . apixaban  5 mg Oral BID  . aspirin EC  81 mg Oral Daily  . atorvastatin  10 mg Oral QHS  . enalapril  20 mg Oral BID  . [START ON 11/07/2013] furosemide  40 mg Oral Daily  . gabapentin  900 mg Oral QHS  . insulin aspart  0-9 Units Subcutaneous TID WC  . latanoprost  1 drop Left Eye QHS  . metoprolol  25 mg Oral BID  . sodium chloride  3 mL Intravenous Q12H   Continuous Infusions:   Active Problems:   Pulmonary edema   Hypertension   Atrial fibrillation   Type II or unspecified type diabetes mellitus without mention of complication, uncontrolled   Acute on  chronic diastolic heart failure    Time spent: 30 min    Elizah Lydon  [facility] Pager [phone]. If 7PM-7AM, please contact night-coverage at [email or web address], password [facility]RH1 11/06/2013, 2:39 PM  LOS: 2 days

## 2013-11-07 DIAGNOSIS — I4891 Unspecified atrial fibrillation: Secondary | ICD-10-CM

## 2013-11-07 DIAGNOSIS — I5033 Acute on chronic diastolic (congestive) heart failure: Secondary | ICD-10-CM

## 2013-11-07 LAB — BASIC METABOLIC PANEL
BUN: 25 mg/dL — ABNORMAL HIGH (ref 6–23)
CHLORIDE: 97 meq/L (ref 96–112)
CO2: 26 mEq/L (ref 19–32)
Calcium: 9.1 mg/dL (ref 8.4–10.5)
Creatinine, Ser: 1.12 mg/dL (ref 0.50–1.35)
GFR calc Af Amer: 73 mL/min — ABNORMAL LOW (ref >90)
GFR calc non Af Amer: 63 mL/min — ABNORMAL LOW (ref >90)
Glucose, Bld: 131 mg/dL — ABNORMAL HIGH (ref 70–99)
Potassium: 4.4 mEq/L (ref 3.7–5.3)
SODIUM: 136 meq/L — AB (ref 137–147)

## 2013-11-07 LAB — GLUCOSE, CAPILLARY
Glucose-Capillary: 139 mg/dL — ABNORMAL HIGH (ref 70–99)
Glucose-Capillary: 143 mg/dL — ABNORMAL HIGH (ref 70–99)

## 2013-11-07 MED ORDER — HYDROCHLOROTHIAZIDE 12.5 MG PO CAPS: 12.5000 mg | ORAL_CAPSULE | Freq: Two times a day (BID) | ORAL | Status: DC

## 2013-11-07 NOTE — Discharge Summary (Signed)
Physician Discharge Summary  [name] Sherman WUJ:[id number] DOB: [birth year] DOA: 11/04/2013  PCP: [name], PA-C  Admit date: 11/04/2013 Discharge date: 11/07/2013  Time spent: 30 minutes  Recommendations for Outpatient Follow-up:  1. Follow up with pcp IN ONE WEEK 2. Follow up with pulm in 1 to 2 weeks.  3. Follow up with cardiology in 2 weeks.   Discharge Diagnoses:  Active Problems:   Pulmonary edema   Hypertension   Atrial fibrillation   Type II or unspecified type diabetes mellitus without mention of complication, uncontrolled   Acute on chronic diastolic heart failure   Discharge Condition: improved.   Diet recommendation: CARB MODIFIED DIET.   Filed Weights   11/05/13 0545 11/06/13 0453 11/07/13 0438  Weight: 75.2 kg (165 lb 12.6 oz) 75.615 kg (166 lb 11.2 oz) 75.161 kg (165 lb 11.2 oz)    History of present illness:  [name] Sherman is a 73 y.o. male with prior h/o hypertension, DM, atrial fib on eliquis, came in for some sob, and easy fatigability on mild exertion. On arrival to ED, he was found to have pulm edema and hypoxic. Marland Kitchen He waas given 80 mg of IV lasix and referred to hospitalist service for admission. He denies any other complaints.   Hospital Course:  1. Pulmonary edema; of unclear etiology / easy fatigability: . Elevated pro bnp of 4838 has decreased to 2570  - ADMITTED to telemetry  - evaluate for CHF, echo, serial troponins negative, EKG in am shows t wave inversions in the anterior leads.  - EKG shows chronic atrial fib , rate controlled.  - echo shows diastolic dysfunction, cardiology consulted, recommended outpatient pulmonary function tests.  - PTS symptoms improved and pulm edema resolved on repeat CXR.  2. Chronic atrial fibrillation:  - rate controlled  - on eliquis.  3. Diabetes Mellitus:  - diet controlled.  SSI  4. Hypertension:  - controlled.  - resume metoprolol, lisinopril.      Procedures: Echocardiogram.  Consultations:  cardiology  Discharge Exam: Filed Vitals:   11/07/13 0906  BP: 132/82  Pulse:   Temp:   Resp:    General: Alert afebrile comfortable  Cardiovascular: s1s2  Respiratory: ctab  Abdomen: soft NT ND BS+  Musculoskeletal: no pedal edema.     Discharge Instructions  Discharge Orders   Future Orders Complete By Expires   Diet - low sodium heart healthy  As directed    Discharge instructions  As directed    Comments:     Follow up withPCP in one week Follow up with cardiology in one week.  Follow up with pulmonology as recommended.       Medication List         apixaban 5 MG Tabs tablet  Commonly known as:  ELIQUIS  Take 5 mg by mouth 2 (two) times daily.     aspirin EC 81 MG tablet  Take 81 mg by mouth daily.     atorvastatin 20 MG tablet  Commonly known as:  LIPITOR  Take 10 mg by mouth at bedtime.     gabapentin 300 MG capsule  Commonly known as:  NEURONTIN  Take 900 mg by mouth at bedtime.     hydrochlorothiazide 12.5 MG capsule  Commonly known as:  MICROZIDE  Take 1 capsule (12.5 mg total) by mouth 2 (two) times daily.     latanoprost 0.005 % ophthalmic solution  Commonly known as:  XALATAN  Place 1 drop into the left eye at bedtime.  lisinopril 20 MG tablet  Commonly known as:  PRINIVIL,ZESTRIL  Take 20 mg by mouth 2 (two) times daily.     metoprolol 50 MG tablet  Commonly known as:  LOPRESSOR  Take 25 mg by mouth 2 (two) times daily. Takes with Lisinopril     tadalafil 20 MG tablet  Commonly known as:  CIALIS  Take 20 mg by mouth daily as needed for erectile dysfunction.       No Known Allergies     Follow-up Information   Follow up with ROBERTSON, ANTHONY T, PA-C. Schedule an appointment as soon as possible for a visit in 1 week.   Specialty:  Physician Assistant   Contact information:   [address] [place] [address] [phone]       Follow up with HAWKINS,EDWARD L,  MD In 1 week. (interstitial lung disease, need pulmonary function tests. )    Specialty:  Pulmonary Disease   Contact information:   [address]  [place] [address] [phone]        The results of significant diagnostics from this hospitalization (including imaging, microbiology, ancillary and laboratory) are listed below for reference.    Significant Diagnostic Studies: Dg Chest 2 View  11/06/2013   CLINICAL DATA:  Followup pulmonary edema  EXAM: CHEST  2 VIEW  COMPARISON:  11/04/2013  FINDINGS: Cardiomediastinal silhouette is stable. There is improvement in aeration without convincing pulmonary edema. Persistent residual peripheral interstitial prominence highly suspicious for fibrotic changes or chronic interstitial lung disease. No segmental infiltrate.  IMPRESSION: Mild spurring of radial head. No posterior fat pad sign. There is subtle lucent line at the base of coronoid process of the ulna suspicious for nondisplaced fracture. Clinical correlation is necessary.   Electronically Signed   By: Natasha Mead M.D.   On: 11/06/2013 13:59   US Abdomen Complete  11/05/2013   CLINICAL DATA:  Elevated liver function tests  EXAM: ULTRASOUND ABDOMEN COMPLETE  COMPARISON:  None.  FINDINGS: Gallbladder:  No gallstones or wall thickening visualized. No sonographic Murphy sign noted.  Common bile duct:  Diameter: 5.5 mm in caliber.  Liver:  Diffusely increased in echogenicity.  No focal mass.  IVC:  Limited visualization.  Grossly patent.  Pancreas:  Obscured by overlying bowel gas.  Spleen:  Size and appearance within normal limits.  Right Kidney:  Length: 11.1 cm in length. Echogenicity within normal limits. No mass or hydronephrosis visualized.  Left Kidney:  Length: 10.5 cm in length. Echogenicity within normal limits. No mass or hydronephrosis visualized.  Abdominal aorta:  Portions were obscured by overlying bowel gas.  No obvious aneurysm.  Other findings:  None.  IMPRESSION:  Diffusely increased echogenicity throughout the liver likely due to diffuse hepatic steatosis. Diffuse hepatic parenchymal disease can have a similar appearance.  Limited visualization of the pancreas, IVC, and aorta.   Electronically Signed   By: Maryclare Bean M.D.   On: 11/05/2013 15:35   Dg Chest Port 1 View  11/04/2013   CLINICAL DATA:  Fatigue.  Chronic, productive cough.  EXAM: PORTABLE CHEST - 1 VIEW  COMPARISON:  PA and lateral chest 11/04/2013 and 10/21/2013.  FINDINGS: Bilateral interstitial opacities have worsened since the most recent study. There is cardiomegaly. There are small bilateral pleural effusions.  IMPRESSION: Worsened aeration most compatible with increased pulmonary edema possibly superimposed on chronic interstitial lung disease.   Electronically Signed   By: Drusilla Kanner M.D.   On: 11/04/2013 15:13  Microbiology: No results found for this or any previous visit (from the past 240 hour(s)).   Labs: Basic Metabolic Panel:  Recent Labs Lab 11/04/13 1457 11/05/13 0458 11/06/13 0505 11/07/13 0603  NA 138 139 137 136*  K 4.2 4.2 4.4 4.4  CL 101 100 98 97  CO2 24 26 26 26   GLUCOSE 113* 113* 124* 131*  BUN 18 21 26* 25*  CREATININE 1.10 1.16 1.10 1.12  CALCIUM 9.3 9.3 9.4 9.1   Liver Function Tests:  Recent Labs Lab 11/04/13 1457  AST 41*  ALT 64*  ALKPHOS 65  BILITOT 1.3*  PROT 7.5  ALBUMIN 3.6   No results found for this basename: LIPASE, AMYLASE,  in the last 168 hours No results found for this basename: AMMONIA,  in the last 168 hours CBC:  Recent Labs Lab 11/04/13 1457 11/05/13 0458  WBC 7.6 6.2  NEUTROABS 5.5  --   HGB 14.9 15.2  HCT 44.0 44.2  MCV 96.1 96.5  PLT 162 166   Cardiac Enzymes:  Recent Labs Lab 11/04/13 1457 11/04/13 1759 11/05/13 0027 11/05/13 0458  TROPONINI <0.30 <0.30 <0.30 <0.30   BNP: BNP (last 3 results)  Recent Labs  11/04/13 1457 11/05/13 0456  PROBNP 4838.0* 2570.0*   CBG:  Recent Labs Lab  11/06/13 1141 11/06/13 1716 11/06/13 2024 11/07/13 0728 11/07/13 1135  GLUCAP 128* 106* 176* 139* 143*       Signed:  Doretha Goding  [facility] 11/07/2013, 12:09 PM

## 2013-11-07 NOTE — Discharge Planning (Signed)
Pt educated on importance of daily weights and most effective way to do it.  Pt indicated that he did have a scale at home already and would start weighing daily and recording the weights in aq notebook.  Pt asked to call Cardiologist if he gains 3+ lbs in 2 days.

## 2013-11-07 NOTE — Discharge Planning (Signed)
Pt stated he was ready to go home and he had no pain.  Pt has had no narcotics, PRNs or surgery since admit.  He's been on tele for whole visit with NSR.  [facility] gave authorization for pt to drive himself home.  Pt's IV was removed, he was given script and DC papers, informed of needed FU appointments, plus educated on CHF s/sx. Pt will be walked to car by RN when ready.

## 2013-11-23 ENCOUNTER — Other Ambulatory Visit (HOSPITAL_COMMUNITY): Payer: Self-pay

## 2013-11-23 ENCOUNTER — Ambulatory Visit (HOSPITAL_COMMUNITY)
Admission: RE | Admit: 2013-11-23 | Discharge: 2013-11-23 | Disposition: A | Discharge status: Home / Self Care | Payer: Medicare Other | Source: Ambulatory Visit | Attending: Pulmonary Disease | Admitting: Pulmonary Disease

## 2013-11-23 ENCOUNTER — Other Ambulatory Visit (HOSPITAL_COMMUNITY): Payer: Self-pay | Admitting: Pulmonary Disease

## 2013-11-23 DIAGNOSIS — I4891 Unspecified atrial fibrillation: Secondary | ICD-10-CM | POA: Insufficient documentation

## 2013-11-23 DIAGNOSIS — R059 Cough, unspecified: Secondary | ICD-10-CM | POA: Insufficient documentation

## 2013-11-23 DIAGNOSIS — R05 Cough: Secondary | ICD-10-CM | POA: Insufficient documentation

## 2013-11-23 DIAGNOSIS — E119 Type 2 diabetes mellitus without complications: Secondary | ICD-10-CM | POA: Insufficient documentation

## 2013-11-23 DIAGNOSIS — I10 Essential (primary) hypertension: Secondary | ICD-10-CM | POA: Insufficient documentation

## 2013-11-23 DIAGNOSIS — J441 Chronic obstructive pulmonary disease with (acute) exacerbation: Secondary | ICD-10-CM

## 2013-11-23 DIAGNOSIS — J189 Pneumonia, unspecified organism: Secondary | ICD-10-CM

## 2013-11-23 DIAGNOSIS — J8410 Pulmonary fibrosis, unspecified: Secondary | ICD-10-CM | POA: Insufficient documentation

## 2013-11-23 DIAGNOSIS — Z87891 Personal history of nicotine dependence: Secondary | ICD-10-CM | POA: Insufficient documentation

## 2013-12-04 ENCOUNTER — Ambulatory Visit (HOSPITAL_COMMUNITY)
Admission: RE | Admit: 2013-12-04 | Discharge: 2013-12-04 | Disposition: A | Discharge status: Home / Self Care | Payer: Medicare Other | Source: Ambulatory Visit | Attending: Pulmonary Disease | Admitting: Pulmonary Disease

## 2013-12-04 DIAGNOSIS — R0609 Other forms of dyspnea: Secondary | ICD-10-CM | POA: Insufficient documentation

## 2013-12-04 DIAGNOSIS — J8410 Pulmonary fibrosis, unspecified: Secondary | ICD-10-CM | POA: Insufficient documentation

## 2013-12-04 DIAGNOSIS — R0989 Other specified symptoms and signs involving the circulatory and respiratory systems: Principal | ICD-10-CM | POA: Insufficient documentation

## 2013-12-04 LAB — PULMONARY FUNCTION TEST
DL/VA % pred: 48 %
DL/VA: 2.26 ml/min/mmHg/L
DLCO UNC % PRED: 23 %
DLCO cor % pred: 23 %
DLCO cor: 7.86 ml/min/mmHg
DLCO unc: 7.86 ml/min/mmHg
FEF 25-75 POST: 1.92 L/s
FEF 25-75 Pre: 1.68 L/sec
FEF2575-%Change-Post: 14 %
FEF2575-%PRED-POST: 80 %
FEF2575-%Pred-Pre: 70 %
FEV1-%Change-Post: 2 %
FEV1-%PRED-POST: 75 %
FEV1-%PRED-PRE: 73 %
FEV1-PRE: 2.15 L
FEV1-Post: 2.21 L
FEV1FVC-%Change-Post: 4 %
FEV1FVC-%PRED-PRE: 99 %
FEV6-%Change-Post: -2 %
FEV6-%Pred-Post: 74 %
FEV6-%Pred-Pre: 76 %
FEV6-POST: 2.77 L
FEV6-Pre: 2.83 L
FEV6FVC-%CHANGE-POST: 0 %
FEV6FVC-%Pred-Post: 105 %
FEV6FVC-%Pred-Pre: 105 %
FVC-%CHANGE-POST: -1 %
FVC-%PRED-PRE: 72 %
FVC-%Pred-Post: 71 %
FVC-Post: 2.78 L
FVC-Pre: 2.83 L
PRE FEV1/FVC RATIO: 76 %
Post FEV1/FVC ratio: 80 %
Post FEV6/FVC ratio: 100 %
Pre FEV6/FVC Ratio: 100 %
RV % pred: 55 %
RV: 1.43 L
TLC % pred: 56 %
TLC: 4.09 L

## 2013-12-04 MED ORDER — ALBUTEROL SULFATE (2.5 MG/3ML) 0.083% IN NEBU
2.5000 mg | INHALATION_SOLUTION | Freq: Once | RESPIRATORY_TRACT | Status: AC
  Administered 2013-12-04: 2.5 mg via RESPIRATORY_TRACT

## 2013-12-08 ENCOUNTER — Other Ambulatory Visit (HOSPITAL_COMMUNITY): Payer: Self-pay | Admitting: Pulmonary Disease

## 2013-12-08 DIAGNOSIS — J8410 Pulmonary fibrosis, unspecified: Secondary | ICD-10-CM

## 2013-12-10 ENCOUNTER — Other Ambulatory Visit (HOSPITAL_COMMUNITY): Payer: Medicare Other

## 2013-12-10 NOTE — Procedures (Signed)
NAME:  [name] Sherman, [name] Sherman                ACCOUNT NO.:  [id number]  MEDICAL RECORD NO.:  [id number]  LOCATION:                                 FACILITY:  PHYSICIAN:  Orren Pietsch L. Juanetta GoslingHawkins, M.D.DATE OF BIRTH:  17-May-1941  DATE OF PROCEDURE:  12/09/2013 DATE OF DISCHARGE:                           PULMONARY FUNCTION TEST   Reason for pulmonary function testing is pulmonary fibrosis/COPD exacerbation. 1. Spirometry shows a mild ventilatory defect without definite airflow     obstruction except in the smaller airways. 2. Lung volumes are reduced consistent with a clinical diagnosis of     pulmonary fibrosis. 3. Airway resistance is high consistent with airflow obstruction. 4. DLCO is severely reduced. 5. This study is consistent with the clinical diagnosis of pulmonary     fibrosis.     Dawaun Brancato L. Juanetta GoslingHawkins, M.D.     ELH/MEDQ  D:  12/09/2013  T:  12/10/2013  Job:  [id number]

## 2013-12-11 ENCOUNTER — Ambulatory Visit (HOSPITAL_COMMUNITY)
Admission: RE | Admit: 2013-12-11 | Discharge: 2013-12-11 | Disposition: A | Discharge status: Home / Self Care | Payer: Medicare Other | Source: Ambulatory Visit | Attending: Pulmonary Disease | Admitting: Pulmonary Disease

## 2013-12-11 DIAGNOSIS — I2510 Atherosclerotic heart disease of native coronary artery without angina pectoris: Secondary | ICD-10-CM | POA: Insufficient documentation

## 2013-12-11 DIAGNOSIS — J8410 Pulmonary fibrosis, unspecified: Secondary | ICD-10-CM

## 2014-04-01 ENCOUNTER — Emergency Department (HOSPITAL_COMMUNITY): Payer: Medicare Other

## 2014-04-01 ENCOUNTER — Inpatient Hospital Stay (HOSPITAL_COMMUNITY)
Admission: EM | Admit: 2014-04-01 | Discharge: 2014-04-03 | DRG: 196 | Disposition: A | Discharge status: Home / Self Care | Payer: Medicare Other | Attending: Internal Medicine | Admitting: Internal Medicine

## 2014-04-01 ENCOUNTER — Encounter (HOSPITAL_COMMUNITY): Payer: Self-pay | Admitting: Emergency Medicine

## 2014-04-01 DIAGNOSIS — I10 Essential (primary) hypertension: Secondary | ICD-10-CM | POA: Diagnosis present

## 2014-04-01 DIAGNOSIS — IMO0001 Reserved for inherently not codable concepts without codable children: Secondary | ICD-10-CM | POA: Diagnosis present

## 2014-04-01 DIAGNOSIS — E782 Mixed hyperlipidemia: Secondary | ICD-10-CM | POA: Diagnosis present

## 2014-04-01 DIAGNOSIS — Z7901 Long term (current) use of anticoagulants: Secondary | ICD-10-CM

## 2014-04-01 DIAGNOSIS — T380X5A Adverse effect of glucocorticoids and synthetic analogues, initial encounter: Secondary | ICD-10-CM | POA: Diagnosis present

## 2014-04-01 DIAGNOSIS — J9600 Acute respiratory failure, unspecified whether with hypoxia or hypercapnia: Secondary | ICD-10-CM | POA: Diagnosis present

## 2014-04-01 DIAGNOSIS — J40 Bronchitis, not specified as acute or chronic: Secondary | ICD-10-CM

## 2014-04-01 DIAGNOSIS — E1165 Type 2 diabetes mellitus with hyperglycemia: Secondary | ICD-10-CM

## 2014-04-01 DIAGNOSIS — I482 Chronic atrial fibrillation, unspecified: Secondary | ICD-10-CM

## 2014-04-01 DIAGNOSIS — I4891 Unspecified atrial fibrillation: Secondary | ICD-10-CM | POA: Diagnosis present

## 2014-04-01 DIAGNOSIS — Z8701 Personal history of pneumonia (recurrent): Secondary | ICD-10-CM

## 2014-04-01 DIAGNOSIS — Z833 Family history of diabetes mellitus: Secondary | ICD-10-CM

## 2014-04-01 DIAGNOSIS — J8410 Pulmonary fibrosis, unspecified: Principal | ICD-10-CM | POA: Diagnosis present

## 2014-04-01 DIAGNOSIS — J9601 Acute respiratory failure with hypoxia: Secondary | ICD-10-CM

## 2014-04-01 DIAGNOSIS — R0902 Hypoxemia: Secondary | ICD-10-CM

## 2014-04-01 LAB — CBC WITH DIFFERENTIAL/PLATELET
Basophils Absolute: 0 10*3/uL (ref 0.0–0.1)
Basophils Relative: 0 % (ref 0–1)
EOS PCT: 1 % (ref 0–5)
Eosinophils Absolute: 0.1 10*3/uL (ref 0.0–0.7)
HEMATOCRIT: 46.6 % (ref 39.0–52.0)
HEMOGLOBIN: 16.1 g/dL (ref 13.0–17.0)
LYMPHS ABS: 1.1 10*3/uL (ref 0.7–4.0)
LYMPHS PCT: 12 % (ref 12–46)
MCH: 33.6 pg (ref 26.0–34.0)
MCHC: 34.5 g/dL (ref 30.0–36.0)
MCV: 97.3 fL (ref 78.0–100.0)
MONO ABS: 0.7 10*3/uL (ref 0.1–1.0)
Monocytes Relative: 8 % (ref 3–12)
NEUTROS ABS: 7.2 10*3/uL (ref 1.7–7.7)
Neutrophils Relative %: 79 % — ABNORMAL HIGH (ref 43–77)
Platelets: 159 10*3/uL (ref 150–400)
RBC: 4.79 MIL/uL (ref 4.22–5.81)
RDW: 13.1 % (ref 11.5–15.5)
WBC: 9 10*3/uL (ref 4.0–10.5)

## 2014-04-01 LAB — URINALYSIS, ROUTINE W REFLEX MICROSCOPIC
BILIRUBIN URINE: NEGATIVE
GLUCOSE, UA: 500 mg/dL — AB
Hgb urine dipstick: NEGATIVE
Ketones, ur: NEGATIVE mg/dL
LEUKOCYTES UA: NEGATIVE
NITRITE: NEGATIVE
PH: 5.5 (ref 5.0–8.0)
Protein, ur: NEGATIVE mg/dL
SPECIFIC GRAVITY, URINE: 1.025 (ref 1.005–1.030)
Urobilinogen, UA: 0.2 mg/dL (ref 0.0–1.0)

## 2014-04-01 LAB — COMPREHENSIVE METABOLIC PANEL
ALBUMIN: 3.8 g/dL (ref 3.5–5.2)
ALT: 22 U/L (ref 0–53)
AST: 26 U/L (ref 0–37)
Alkaline Phosphatase: 56 U/L (ref 39–117)
BUN: 35 mg/dL — AB (ref 6–23)
CALCIUM: 9.7 mg/dL (ref 8.4–10.5)
CO2: 24 mEq/L (ref 19–32)
CREATININE: 1.53 mg/dL — AB (ref 0.50–1.35)
Chloride: 101 mEq/L (ref 96–112)
GFR calc non Af Amer: 43 mL/min — ABNORMAL LOW (ref >90)
GFR, EST AFRICAN AMERICAN: 50 mL/min — AB (ref >90)
GLUCOSE: 198 mg/dL — AB (ref 70–99)
Potassium: 4.7 mEq/L (ref 3.7–5.3)
Sodium: 139 mEq/L (ref 137–147)
Total Bilirubin: 0.7 mg/dL (ref 0.3–1.2)
Total Protein: 8 g/dL (ref 6.0–8.3)

## 2014-04-01 LAB — TROPONIN I

## 2014-04-01 LAB — D-DIMER, QUANTITATIVE: D-Dimer, Quant: 0.37 ug/mL-FEU (ref 0.00–0.48)

## 2014-04-01 LAB — PRO B NATRIURETIC PEPTIDE: Pro B Natriuretic peptide (BNP): 1414 pg/mL — ABNORMAL HIGH (ref 0–125)

## 2014-04-01 LAB — SEDIMENTATION RATE: Sed Rate: 2 mm/hr (ref 0–16)

## 2014-04-01 LAB — GLUCOSE, CAPILLARY: Glucose-Capillary: 219 mg/dL — ABNORMAL HIGH (ref 70–99)

## 2014-04-01 MED ORDER — METOPROLOL TARTRATE 25 MG PO TABS
25.0000 mg | ORAL_TABLET | Freq: Two times a day (BID) | ORAL | Status: DC
  Administered 2014-04-01 – 2014-04-03 (×4): 25 mg via ORAL
  Filled 2014-04-01 (×4): qty 1

## 2014-04-01 MED ORDER — ATORVASTATIN CALCIUM 10 MG PO TABS
10.0000 mg | ORAL_TABLET | Freq: Every day | ORAL | Status: DC
  Administered 2014-04-01 – 2014-04-02 (×2): 10 mg via ORAL
  Filled 2014-04-01 (×2): qty 1

## 2014-04-01 MED ORDER — ONDANSETRON HCL 4 MG/2ML IJ SOLN: 4.0000 mg | Freq: Four times a day (QID) | INTRAMUSCULAR | Status: DC | PRN

## 2014-04-01 MED ORDER — INSULIN ASPART 100 UNIT/ML ~~LOC~~ SOLN
0.0000 [IU] | Freq: Every day | SUBCUTANEOUS | Status: DC
  Administered 2014-04-01: 2 [IU] via SUBCUTANEOUS

## 2014-04-01 MED ORDER — ONDANSETRON HCL 4 MG PO TABS: 4.0000 mg | ORAL_TABLET | Freq: Four times a day (QID) | ORAL | Status: DC | PRN

## 2014-04-01 MED ORDER — ASPIRIN EC 81 MG PO TBEC
81.0000 mg | DELAYED_RELEASE_TABLET | Freq: Every day | ORAL | Status: DC
  Administered 2014-04-02 – 2014-04-03 (×2): 81 mg via ORAL
  Filled 2014-04-01 (×2): qty 1

## 2014-04-01 MED ORDER — ADULT MULTIVITAMIN W/MINERALS CH
1.0000 | ORAL_TABLET | Freq: Every day | ORAL | Status: DC
  Administered 2014-04-01 – 2014-04-03 (×3): 1 via ORAL
  Filled 2014-04-01 (×3): qty 1

## 2014-04-01 MED ORDER — METHYLPREDNISOLONE SODIUM SUCC 125 MG IJ SOLR
125.0000 mg | Freq: Once | INTRAMUSCULAR | Status: AC
  Administered 2014-04-01: 125 mg via INTRAVENOUS
  Filled 2014-04-01: qty 2

## 2014-04-01 MED ORDER — IPRATROPIUM-ALBUTEROL 0.5-2.5 (3) MG/3ML IN SOLN
3.0000 mL | Freq: Once | RESPIRATORY_TRACT | Status: AC
  Administered 2014-04-01: 3 mL via RESPIRATORY_TRACT
  Filled 2014-04-01: qty 3

## 2014-04-01 MED ORDER — HYDROCHLOROTHIAZIDE 12.5 MG PO CAPS
12.5000 mg | ORAL_CAPSULE | Freq: Every day | ORAL | Status: DC
  Administered 2014-04-02 – 2014-04-03 (×2): 12.5 mg via ORAL
  Filled 2014-04-01 (×2): qty 1

## 2014-04-01 MED ORDER — LISINOPRIL 10 MG PO TABS
20.0000 mg | ORAL_TABLET | Freq: Two times a day (BID) | ORAL | Status: DC
  Administered 2014-04-01 – 2014-04-03 (×4): 20 mg via ORAL
  Filled 2014-04-01 (×4): qty 2

## 2014-04-01 MED ORDER — LATANOPROST 0.005 % OP SOLN
1.0000 [drp] | Freq: Every day | OPHTHALMIC | Status: DC
  Administered 2014-04-01 – 2014-04-02 (×2): 1 [drp] via OPHTHALMIC
  Filled 2014-04-01: qty 2.5

## 2014-04-01 MED ORDER — SODIUM CHLORIDE 0.9 % IV SOLN
INTRAVENOUS | Status: DC
  Administered 2014-04-01: 14:00:00 via INTRAVENOUS

## 2014-04-01 MED ORDER — APIXABAN 5 MG PO TABS
5.0000 mg | ORAL_TABLET | Freq: Two times a day (BID) | ORAL | Status: DC
  Administered 2014-04-01 – 2014-04-03 (×4): 5 mg via ORAL
  Filled 2014-04-01 (×4): qty 1

## 2014-04-01 MED ORDER — METHYLPREDNISOLONE SODIUM SUCC 125 MG IJ SOLR
125.0000 mg | Freq: Four times a day (QID) | INTRAMUSCULAR | Status: DC
  Administered 2014-04-02 (×4): 125 mg via INTRAVENOUS
  Filled 2014-04-01 (×4): qty 2

## 2014-04-01 MED ORDER — INSULIN ASPART 100 UNIT/ML ~~LOC~~ SOLN
0.0000 [IU] | Freq: Three times a day (TID) | SUBCUTANEOUS | Status: DC
  Administered 2014-04-02: 5 [IU] via SUBCUTANEOUS
  Administered 2014-04-02: 8 [IU] via SUBCUTANEOUS
  Administered 2014-04-02 – 2014-04-03 (×2): 5 [IU] via SUBCUTANEOUS
  Administered 2014-04-03: 8 [IU] via SUBCUTANEOUS

## 2014-04-01 NOTE — ED Notes (Signed)
MD at bedside. 

## 2014-04-01 NOTE — ED Notes (Signed)
[facility]  Reports went to PCP today bcause of worsening fatigue for the past several weeks and cough.  Has history of pulmonary fibrosis and was hospitalized for pneumonia, pulmonary edema, and hypoxemia in Jan.  Pt on eliquis for Afib.  Reports pt's 02 sat on room air at rest was 88%, was put on 2 liters at the office and increased to 97%.  See chart for more info and lab work.

## 2014-04-01 NOTE — ED Provider Notes (Signed)
CSN: [id number]     Arrival date & time 04/01/14  1308 History  This chart was scribed for [name] Mulders, MD by [name], ED Scribe. This patient was seen in room APA06/APA06 and the patient's care was started 1:27 PM.    Chief Complaint  Patient presents with  . Cough    The history is provided by the patient. No language interpreter was used.    HPI Comments: [name] Sherman is a 73 y.o. male who presents to the Emergency Department complaining of an intermittent, unchanged cough productive of sputum for the past several weeks. He reports associated fatigue that is worse with exertion. He reports visiting his PCP this morning who advised him to come to the ED due to low O2 saturation. It is reported that patient was 88% on RA upon arrival and is 94% on 2L of oxygen. Patient reports a history of pneumonia in February 2015 for which he was hospitalized for a period of several days. Patient states he does not use oxygen at home. He denies fever, extremity pain, nausea, vomiting.    PCP [name] at [facility]  Past Medical History  Diagnosis Date  . Essential hypertension, benign   . Type 2 diabetes mellitus   . Atrial fibrillation     Dr. [name] [facility]  . Mixed hyperlipidemia    Past Surgical History  Procedure Laterality Date  . Hernia repair    . Eye surgery     Family History  Problem Relation Age of Onset  . Diabetes Mellitus II Mother   . Diabetes Mellitus II Sister    History  Substance Use Topics  . Smoking status: Never Smoker   . Smokeless tobacco: Not on file  . Alcohol Use: Yes     Comment: Occasional    Review of Systems  Constitutional: Positive for fatigue. Negative for fever and chills.  HENT: Positive for rhinorrhea. Negative for sore throat.   Eyes: Negative for visual disturbance.  Respiratory: Positive for cough. Negative for shortness of breath.   Cardiovascular: Negative for chest pain and leg swelling.  Gastrointestinal:  Negative for nausea, vomiting, abdominal pain and diarrhea.  Genitourinary: Negative for dysuria and hematuria.  Musculoskeletal: Negative for back pain and neck pain.  Skin: Negative for rash.  Neurological: Negative for headaches.  Hematological: Bruises/bleeds easily.  Psychiatric/Behavioral: Negative for confusion.      Allergies  Review of patient's allergies indicates no known allergies.  Home Medications   Prior to Admission medications   Medication Sig Start Date End Date Taking? Authorizing Provider  apixaban (ELIQUIS) 5 MG TABS tablet Take 5 mg by mouth 2 (two) times daily.   Yes Historical Provider, MD  aspirin EC 81 MG tablet Take 81 mg by mouth daily.   Yes Historical Provider, MD  atorvastatin (LIPITOR) 20 MG tablet Take 10 mg by mouth at bedtime.   Yes Historical Provider, MD  hydrochlorothiazide (MICROZIDE) 12.5 MG capsule Take 12.5 mg by mouth daily.   Yes Historical Provider, MD  latanoprost (XALATAN) 0.005 % ophthalmic solution Place 1 drop into the left eye at bedtime.   Yes Historical Provider, MD  lisinopril (PRINIVIL,ZESTRIL) 20 MG tablet Take 20 mg by mouth 2 (two) times daily.   Yes Historical Provider, MD  metoprolol (LOPRESSOR) 50 MG tablet Take 25 mg by mouth 2 (two) times daily. Takes with Lisinopril   Yes Historical Provider, MD  Multiple Vitamin (MULTIVITAMIN WITH MINERALS) TABS tablet Take 1 tablet by mouth daily.  Yes Historical Provider, MD  tadalafil (CIALIS) 20 MG tablet Take 20 mg by mouth daily as needed for erectile dysfunction.   Yes Historical Provider, MD   BP 112/65  Pulse 48  Temp(Src) 97.6 F (36.4 C) (Oral)  Resp 20  Ht 5\' 11"  (1.803 m)  Wt 170 lb (77.111 kg)  BMI 23.72 kg/m2  SpO2 96% Physical Exam  Nursing note and vitals reviewed. Constitutional: He is oriented to person, place, and time. He appears well-developed and well-nourished.  HENT:  Head: Normocephalic and atraumatic.  Eyes: Conjunctivae and EOM are normal.  Neck:  Normal range of motion. Neck supple. No tracheal deviation present.  Cardiovascular: Normal rate and normal heart sounds.  An irregular rhythm present.  Pulmonary/Chest: Effort normal and breath sounds normal. No respiratory distress. He has no wheezes. He has no rales. He exhibits no tenderness.  94% on 2L of O2.   Abdominal: Soft. Bowel sounds are normal. He exhibits no distension.  Musculoskeletal: Normal range of motion. He exhibits no edema and no tenderness.  Neurological: He is alert and oriented to person, place, and time.  Skin: Skin is warm and dry.  Psychiatric: He has a normal mood and affect.    ED Course  Procedures (including critical care time)  DIAGNOSTIC STUDIES: Oxygen Saturation is 94% on 2 L O2, adequate by my interpretation.    COORDINATION OF CARE: 1:37 PM Discussed treatment plan with pt at bedside and pt agreed to plan.   Labs Review Labs Reviewed  PRO B NATRIURETIC PEPTIDE - Abnormal; Notable for the following:    Pro B Natriuretic peptide (BNP) 1414.0 (*)    All other components within normal limits  COMPREHENSIVE METABOLIC PANEL - Abnormal; Notable for the following:    Glucose, Bld 198 (*)    BUN 35 (*)    Creatinine, Ser 1.53 (*)    GFR calc non Af Amer 43 (*)    GFR calc Af Amer 50 (*)    All other components within normal limits  CBC WITH DIFFERENTIAL - Abnormal; Notable for the following:    Neutrophils Relative % 79 (*)    All other components within normal limits  URINALYSIS, ROUTINE W REFLEX MICROSCOPIC - Abnormal; Notable for the following:    Glucose, UA 500 (*)    All other components within normal limits  TROPONIN I  D-DIMER, QUANTITATIVE   Results for orders placed during the hospital encounter of 04/01/14  TROPONIN I      Result Value Ref Range   Troponin I <0.30  <0.30 ng/mL  PRO B NATRIURETIC PEPTIDE      Result Value Ref Range   Pro B Natriuretic peptide (BNP) 1414.0 (*) 0 - 125 pg/mL  COMPREHENSIVE METABOLIC PANEL       Result Value Ref Range   Sodium 139  137 - 147 mEq/L   Potassium 4.7  3.7 - 5.3 mEq/L   Chloride 101  96 - 112 mEq/L   CO2 24  19 - 32 mEq/L   Glucose, Bld 198 (*) 70 - 99 mg/dL   BUN 35 (*) 6 - 23 mg/dL   Creatinine, Ser 1.611.53 (*) 0.50 - 1.35 mg/dL   Calcium 9.7  8.4 - 09.610.5 mg/dL   Total Protein 8.0  6.0 - 8.3 g/dL   Albumin 3.8  3.5 - 5.2 g/dL   AST 26  0 - 37 U/L   ALT 22  0 - 53 U/L   Alkaline Phosphatase 56  39 -  117 U/L   Total Bilirubin 0.7  0.3 - 1.2 mg/dL   GFR calc non Af Amer 43 (*) >90 mL/min   GFR calc Af Amer 50 (*) >90 mL/min  CBC WITH DIFFERENTIAL      Result Value Ref Range   WBC 9.0  4.0 - 10.5 K/uL   RBC 4.79  4.22 - 5.81 MIL/uL   Hemoglobin 16.1  13.0 - 17.0 g/dL   HCT 16.1  09.6 - 04.5 %   MCV 97.3  78.0 - 100.0 fL   MCH 33.6  26.0 - 34.0 pg   MCHC 34.5  30.0 - 36.0 g/dL   RDW 40.9  81.1 - 91.4 %   Platelets 159  150 - 400 K/uL   Neutrophils Relative % 79 (*) 43 - 77 %   Neutro Abs 7.2  1.7 - 7.7 K/uL   Lymphocytes Relative 12  12 - 46 %   Lymphs Abs 1.1  0.7 - 4.0 K/uL   Monocytes Relative 8  3 - 12 %   Monocytes Absolute 0.7  0.1 - 1.0 K/uL   Eosinophils Relative 1  0 - 5 %   Eosinophils Absolute 0.1  0.0 - 0.7 K/uL   Basophils Relative 0  0 - 1 %   Basophils Absolute 0.0  0.0 - 0.1 K/uL  URINALYSIS, ROUTINE W REFLEX MICROSCOPIC      Result Value Ref Range   Color, Urine YELLOW  YELLOW   APPearance CLEAR  CLEAR   Specific Gravity, Urine 1.025  1.005 - 1.030   pH 5.5  5.0 - 8.0   Glucose, UA 500 (*) NEGATIVE mg/dL   Hgb urine dipstick NEGATIVE  NEGATIVE   Bilirubin Urine NEGATIVE  NEGATIVE   Ketones, ur NEGATIVE  NEGATIVE mg/dL   Protein, ur NEGATIVE  NEGATIVE mg/dL   Urobilinogen, UA 0.2  0.0 - 1.0 mg/dL   Nitrite NEGATIVE  NEGATIVE   Leukocytes, UA NEGATIVE  NEGATIVE  D-DIMER, QUANTITATIVE      Result Value Ref Range   D-Dimer, Quant 0.37  0.00 - 0.48 ug/mL-FEU     Imaging Review Dg Chest 2 View  04/01/2014   CLINICAL DATA:  Cough and  congestion.  EXAM: CHEST  2 VIEW  COMPARISON:  04/01/2014  FINDINGS: There are changes of interstitial fibrosis, which predominate in the lung bases. No acute lung consolidation. No evidence of edema. No pneumothorax or pleural effusion.  Cardiac silhouette is borderline enlarged. No mediastinal or hilar masses or evidence of adenopathy.  Bony thorax is demineralized but grossly intact.  IMPRESSION: 1. No convincing acute cardiopulmonary disease. 2. Pulmonary fibrosis. No change from the study performed earlier this same date.   Electronically Signed   By: Amie Portland M.D.   On: 04/01/2014 14:32     EKG Interpretation   Date/Time:  Thursday April 01 2014 13:30:06 EDT Ventricular Rate:  70 PR Interval:    QRS Duration: 79 QT Interval:  456 QTC Calculation: 492 R Axis:   95 Text Interpretation:  Atrial fibrillation Right axis deviation Borderline  T wave abnormalities Borderline prolonged QT interval Baseline wander in  lead(s) V6 No significant change since last tracing Confirmed by ZACKOWSKI   MD, SCOTT [phone]) on 04/01/2014 1:37:40 PM      MDM   Final diagnoses:  Hypoxia  Bronchitis  Pulmonary fibrosis    Patient with persistent hypoxia on room air sats dropped down pretty consistently 88%, while at rest. Specific etiology is not clear. D-dimer is  negative chest x-ray negative for pneumonia pulmonary edema pneumothorax. Also as mentioned d-dimer is negative so not likely pulmonary embolus. No significant leukocytosis troponins negative EKG without acute changes other than his chronic atrial fibrillation. But is rate controlled. Patient will require admission he does not have oxygen at home as her past. May be related to the pulmonary fibrosis you know he hasn't had that diagnosis in the past. Did give a trial of a albuterol Atrovent nebulizer without any real change. Perhaps steroids would be of benefit. Patient has had a cough occasionally productive so there could be some component to  that. There may be some underlying pulmonary edema that is not visualized on the regular x-ray but his BNP is not as high as it has been in the past. We'll discuss with the hospitalist for admission. Patient on 2 L of oxygen sats mid 90s.  I personally performed the services described in this documentation, which was scribed in my presence. The recorded information has been reviewed and is accurate.    [name] MuldersScott Zackowski, MD 04/01/14 1623

## 2014-04-01 NOTE — H&P (Signed)
[facility] History and Physical  [name] Sherman RUE:[id number] DOB: 1941-02-24 DOA: 04/01/2014  Referring physician: ER. PCP: [name], PA-C   Chief Complaint: Weakness/fatigue, cough  HPI: [name] Sherman is a 73 y.o. male  This is a 73 year old man who presents to the emergency room after he was seen by his physician, who advised him to come to the ER. He was found to have low oxygen saturation in the office. He describes unchanged cough productive of sputum for the past few weeks but the sputum is white and not green/yellow. He also describes fatigue with exertion. In the emergency room he was desaturating to 88% on room air. He apparently had been admitted in February of this year with pneumonia. However pulmonary function tests done in February showed the presence of changes consistent with pulmonary fibrosis, restrictive defect. He is now being admitted for further management.   Review of Systems:  Constitutional:  No weight loss, night sweats, Fevers, chills,  HEENT:  No headaches, Difficulty swallowing,Tooth/dental problems,Sore throat,  No sneezing, itching, ear ache, nasal congestion, post nasal drip,  Cardio-vascular:  No chest pain, Orthopnea, PND, swelling in lower extremities, anasarca, dizziness, palpitations  GI:  No heartburn, indigestion, abdominal pain, nausea, vomiting, diarrhea, change in bowel habits, loss of appetite  Resp:  No shortness of breath with exertion or at rest.  No coughing up of blood.No change in color of mucus.No wheezing.No chest wall deformity  Skin:  no rash or lesions.  GU:  no dysuria, change in color of urine, no urgency or frequency. No flank pain.  Musculoskeletal:  No joint pain or swelling. No decreased range of motion. No back pain.  Psych:  No change in mood or affect. No depression or anxiety. No memory loss.   Past Medical History  Diagnosis Date  . Essential hypertension, benign   . Type 2 diabetes mellitus   .  Atrial fibrillation     Dr. [name] [facility]  . Mixed hyperlipidemia    Past Surgical History  Procedure Laterality Date  . Hernia repair    . Eye surgery     Social History:  reports that he has never smoked. He does not have any smokeless tobacco history on file. He reports that he drinks alcohol. He reports that he does not use illicit drugs.  No Known Allergies  Family History  Problem Relation Age of Onset  . Diabetes Mellitus II Mother   . Diabetes Mellitus II Sister      Prior to Admission medications   Medication Sig Start Date End Date Taking? Authorizing Provider  apixaban (ELIQUIS) 5 MG TABS tablet Take 5 mg by mouth 2 (two) times daily.   Yes Historical Provider, MD  aspirin EC 81 MG tablet Take 81 mg by mouth daily.   Yes Historical Provider, MD  atorvastatin (LIPITOR) 20 MG tablet Take 10 mg by mouth at bedtime.   Yes Historical Provider, MD  hydrochlorothiazide (MICROZIDE) 12.5 MG capsule Take 12.5 mg by mouth daily.   Yes Historical Provider, MD  latanoprost (XALATAN) 0.005 % ophthalmic solution Place 1 drop into the left eye at bedtime.   Yes Historical Provider, MD  lisinopril (PRINIVIL,ZESTRIL) 20 MG tablet Take 20 mg by mouth 2 (two) times daily.   Yes Historical Provider, MD  metoprolol (LOPRESSOR) 50 MG tablet Take 25 mg by mouth 2 (two) times daily. Takes with Lisinopril   Yes Historical Provider, MD  Multiple Vitamin (MULTIVITAMIN WITH MINERALS) TABS tablet Take 1 tablet by  mouth daily.   Yes Historical Provider, MD  tadalafil (CIALIS) 20 MG tablet Take 20 mg by mouth daily as needed for erectile dysfunction.   Yes Historical Provider, MD   Physical Exam: Filed Vitals:   04/01/14 1730  BP: 106/70  Pulse: 47  Temp:   Resp: 19    BP 106/70  Pulse 47  Temp(Src) 97.6 F (36.4 C) (Oral)  Resp 19  Ht 5\' 11"  (1.803 m)  Wt 77.111 kg (170 lb)  BMI 23.72 kg/m2  SpO2 94%  General:  Appears calm and comfortable. No peripheral or central cyanosis. No  increased work of breathing at rest. Eyes: PERRL, normal lids, irises & conjunctiva ENT: grossly normal hearing, lips & tongue Neck: no LAD, masses or thyromegaly Cardiovascular: Irregularly irregular, consistent with atrial fibrillation. No clinical signs of heart failure. Telemetry: Atrial fibrillation. Respiratory: Fine inspiratory crackles from the mid to lower zones bilaterally. No bronchial breathing or wheezing. Abdomen: soft, ntnd Skin: no rash or induration seen on limited exam Musculoskeletal: grossly normal tone BUE/BLE Psychiatric: grossly normal mood and affect, speech fluent and appropriate Neurologic: grossly non-focal.          Labs on Admission:  Basic Metabolic Panel:  Recent Labs Lab 04/01/14 1350  NA 139  K 4.7  CL 101  CO2 24  GLUCOSE 198*  BUN 35*  CREATININE 1.53*  CALCIUM 9.7   Liver Function Tests:  Recent Labs Lab 04/01/14 1350  AST 26  ALT 22  ALKPHOS 56  BILITOT 0.7  PROT 8.0  ALBUMIN 3.8   No results found for this basename: LIPASE, AMYLASE,  in the last 168 hours No results found for this basename: AMMONIA,  in the last 168 hours CBC:  Recent Labs Lab 04/01/14 1350  WBC 9.0  NEUTROABS 7.2  HGB 16.1  HCT 46.6  MCV 97.3  PLT 159   Cardiac Enzymes:  Recent Labs Lab 04/01/14 1350  TROPONINI <0.30    BNP (last 3 results)  Recent Labs  11/04/13 1457 11/05/13 0456 04/01/14 1350  PROBNP 4838.0* 2570.0* 1414.0*   CBG: No results found for this basename: GLUCAP,  in the last 168 hours  Radiological Exams on Admission: Dg Chest 2 View  04/01/2014   CLINICAL DATA:  Cough and congestion.  EXAM: CHEST  2 VIEW  COMPARISON:  04/01/2014  FINDINGS: There are changes of interstitial fibrosis, which predominate in the lung bases. No acute lung consolidation. No evidence of edema. No pneumothorax or pleural effusion.  Cardiac silhouette is borderline enlarged. No mediastinal or hilar masses or evidence of adenopathy.  Bony thorax  is demineralized but grossly intact.  IMPRESSION: 1. No convincing acute cardiopulmonary disease. 2. Pulmonary fibrosis. No change from the study performed earlier this same date.   Electronically Signed   By: Amie Portlandavid  Ormond M.D.   On: 04/01/2014 14:32    EKG: Independently reviewed. Atrial fibrillation, rate control. No acute ST-T wave changes.  Assessment/Plan Active Problems:   Pulmonary fibrosis   Hypertension   Atrial fibrillation   Type II or unspecified type diabetes mellitus without mention of complication, uncontrolled   1. Pulmonary fibrosis. Possibly he is responsive to steroids. 2. Chronic atrial fibrillation on chronic anticoagulation therapy. 3. Hypertension.     Plan: 1. Admit to telemetry floor. 2. Trial of intravenous steroids. 3. Pulmonary consultation.  Further recommendations will depend on patient's hospital progress.      Code Status: Full code.   Family Communication: I discussed the plan with the  patient at the bedside.   Disposition Plan: Home when medically stable.  Time spent: 60 minutes.  Wilson SingerGOSRANI,Manie Bealer C [facility] Pager [phone].  **Disclaimer: This note may have been dictated with voice recognition software. Similar sounding words can inadvertently be transcribed and this note may contain transcription errors which may not have been corrected upon publication of note.**

## 2014-04-02 DIAGNOSIS — J9600 Acute respiratory failure, unspecified whether with hypoxia or hypercapnia: Secondary | ICD-10-CM

## 2014-04-02 LAB — GLUCOSE, CAPILLARY
GLUCOSE-CAPILLARY: 251 mg/dL — AB (ref 70–99)
GLUCOSE-CAPILLARY: 187 mg/dL — AB (ref 70–99)
Glucose-Capillary: 232 mg/dL — ABNORMAL HIGH (ref 70–99)
Glucose-Capillary: 241 mg/dL — ABNORMAL HIGH (ref 70–99)

## 2014-04-02 LAB — COMPREHENSIVE METABOLIC PANEL
ALBUMIN: 3.5 g/dL (ref 3.5–5.2)
ALK PHOS: 54 U/L (ref 39–117)
ALT: 17 U/L (ref 0–53)
AST: 20 U/L (ref 0–37)
BUN: 34 mg/dL — AB (ref 6–23)
CO2: 24 mEq/L (ref 19–32)
CREATININE: 1.35 mg/dL (ref 0.50–1.35)
Calcium: 9.1 mg/dL (ref 8.4–10.5)
Chloride: 98 mEq/L (ref 96–112)
GFR calc non Af Amer: 50 mL/min — ABNORMAL LOW (ref >90)
GFR, EST AFRICAN AMERICAN: 58 mL/min — AB (ref >90)
GLUCOSE: 235 mg/dL — AB (ref 70–99)
POTASSIUM: 5 meq/L (ref 3.7–5.3)
Sodium: 137 mEq/L (ref 137–147)
TOTAL PROTEIN: 7.5 g/dL (ref 6.0–8.3)
Total Bilirubin: 1 mg/dL (ref 0.3–1.2)

## 2014-04-02 LAB — CBC
HEMATOCRIT: 46.7 % (ref 39.0–52.0)
Hemoglobin: 15.9 g/dL (ref 13.0–17.0)
MCH: 32.9 pg (ref 26.0–34.0)
MCHC: 34 g/dL (ref 30.0–36.0)
MCV: 96.5 fL (ref 78.0–100.0)
Platelets: 165 10*3/uL (ref 150–400)
RBC: 4.84 MIL/uL (ref 4.22–5.81)
RDW: 12.9 % (ref 11.5–15.5)
WBC: 8.7 10*3/uL (ref 4.0–10.5)

## 2014-04-02 LAB — PRO B NATRIURETIC PEPTIDE: Pro B Natriuretic peptide (BNP): 1543 pg/mL — ABNORMAL HIGH (ref 0–125)

## 2014-04-02 LAB — TSH: TSH: 1.17 u[IU]/mL (ref 0.350–4.500)

## 2014-04-02 LAB — TROPONIN I

## 2014-04-02 MED ORDER — BIOTENE DRY MOUTH MT LIQD
15.0000 mL | Freq: Two times a day (BID) | OROMUCOSAL | Status: DC
  Administered 2014-04-02 – 2014-04-03 (×3): 15 mL via OROMUCOSAL

## 2014-04-02 MED ORDER — METHYLPREDNISOLONE SODIUM SUCC 125 MG IJ SOLR
60.0000 mg | Freq: Four times a day (QID) | INTRAMUSCULAR | Status: DC
  Administered 2014-04-03 (×3): 60 mg via INTRAVENOUS
  Filled 2014-04-02 (×3): qty 2

## 2014-04-02 NOTE — Consult Note (Signed)
Consult requested by: [facility] Consult requested for pulmonary fibrosis:  HPI: This is a 73 year old who has a diagnosis of idiopathic pulmonary fibrosis. In addition to that he has history of hypertension diabetes atrial fibrillation and hyperlipidemia. His pulmonary fibrosis has been fairly recently discovered and is in the process of full workup. He went to his primary care physician on the day of admission and was found to be hypoxic. He was sent to the emergency department where his hypoxia was confirmed and he was admitted from there. He says he feels better. He has not had much shortness of breath but he describes a feeling of being tired when he exerts himself. He is coughing some.  Past Medical History  Diagnosis Date  . Essential hypertension, benign   . Type 2 diabetes mellitus   . Atrial fibrillation     [facility]  . Mixed hyperlipidemia      Family History  Problem Relation Age of Onset  . Diabetes Mellitus II Mother   . Diabetes Mellitus II Sister      History   Social History  . Marital Status: Married    Spouse Name: N/A    Number of Children: N/A  . Years of Education: N/A   Social History Main Topics  . Smoking status: Never Smoker   . Smokeless tobacco: None  . Alcohol Use: Yes     Comment: Occasional  . Drug Use: No  . Sexual Activity: No   Other Topics Concern  . None   Social History Narrative  . None     ROS: He has not had chest pain hemoptysis swelling of his legs abdominal pain headache nausea or vomiting    Objective: Vital signs in last 24 hours: Temp:  [97.4 F (36.3 C)-98.4 F (36.9 C)] 97.4 F (36.3 C) (06/19 0539) Pulse Rate:  [47-94] 68 (06/19 0836) Resp:  [15-25] 20 (06/19 0539) BP: (100-124)/(54-76) 115/64 mmHg (06/19 0539) SpO2:  [88 %-97 %] 94 % (06/19 0539) Weight:  [77.111 kg (170 lb)] 77.111 kg (170 lb) (06/18 2014) Weight change:  Last BM Date: 04/02/14  Intake/Output from previous  day: 06/18 0701 - 06/19 0700 In: -  Out: 500 [Urine:500]  PHYSICAL EXAM He is awake and alert and looks pretty comfortable. He is using oxygen. His pupils are reactive. Nose and throat are clear. Neck is supple without masses. His chest shows rales bilaterally. His heart seems regular at this time. He does not have a gallop. His abdomen is soft without masses. Extremities showed no edema. Central nervous system exam is grossly intact  Lab Results: Basic Metabolic Panel:  Recent Labs  16/07/9605/18/15 1350 04/02/14 0525  NA 139 137  K 4.7 5.0  CL 101 98  CO2 24 24  GLUCOSE 198* 235*  BUN 35* 34*  CREATININE 1.53* 1.35  CALCIUM 9.7 9.1   Liver Function Tests:  Recent Labs  04/01/14 1350 04/02/14 0525  AST 26 20  ALT 22 17  ALKPHOS 56 54  BILITOT 0.7 1.0  PROT 8.0 7.5  ALBUMIN 3.8 3.5   No results found for this basename: LIPASE, AMYLASE,  in the last 72 hours No results found for this basename: AMMONIA,  in the last 72 hours CBC:  Recent Labs  04/01/14 1350 04/02/14 0525  WBC 9.0 8.7  NEUTROABS 7.2  --   HGB 16.1 15.9  HCT 46.6 46.7  MCV 97.3 96.5  PLT 159 165   Cardiac Enzymes:  Recent Labs  04/01/14 1824 04/02/14 0021 04/02/14 0525  TROPONINI <0.30 <0.30 <0.30   BNP:  Recent Labs  04/01/14 1350 04/02/14 0525  PROBNP 1414.0* 1543.0*   D-Dimer:  Recent Labs  04/01/14 1350  DDIMER 0.37   CBG:  Recent Labs  04/01/14 2053 04/02/14 0730  GLUCAP 219* 232*   Hemoglobin A1C: No results found for this basename: HGBA1C,  in the last 72 hours Fasting Lipid Panel: No results found for this basename: CHOL, HDL, LDLCALC, TRIG, CHOLHDL, LDLDIRECT,  in the last 72 hours Thyroid Function Tests:  Recent Labs  04/01/14 1824  TSH 1.170   Anemia Panel: No results found for this basename: VITAMINB12, FOLATE, FERRITIN, TIBC, IRON, RETICCTPCT,  in the last 72 hours Coagulation: No results found for this basename: LABPROT, INR,  in the last 72  hours Urine Drug Screen: Drugs of Abuse  No results found for this basename: labopia, cocainscrnur, labbenz, amphetmu, thcu, labbarb    Alcohol Level: No results found for this basename: ETH,  in the last 72 hours Urinalysis:  Recent Labs  04/01/14 1429  COLORURINE YELLOW  LABSPEC 1.025  PHURINE 5.5  GLUCOSEU 500*  HGBUR NEGATIVE  BILIRUBINUR NEGATIVE  KETONESUR NEGATIVE  PROTEINUR NEGATIVE  UROBILINOGEN 0.2  NITRITE NEGATIVE  LEUKOCYTESUR NEGATIVE   Misc. Labs:   ABGS: No results found for this basename: PHART, PCO2, PO2ART, TCO2, HCO3,  in the last 72 hours   MICROBIOLOGY: No results found for this or any previous visit (from the past 240 hour(s)).  Studies/Results: Dg Chest 2 View  04/01/2014   CLINICAL DATA:  Cough and congestion.  EXAM: CHEST  2 VIEW  COMPARISON:  04/01/2014  FINDINGS: There are changes of interstitial fibrosis, which predominate in the lung bases. No acute lung consolidation. No evidence of edema. No pneumothorax or pleural effusion.  Cardiac silhouette is borderline enlarged. No mediastinal or hilar masses or evidence of adenopathy.  Bony thorax is demineralized but grossly intact.  IMPRESSION: 1. No convincing acute cardiopulmonary disease. 2. Pulmonary fibrosis. No change from the study performed earlier this same date.   Electronically Signed   By: Amie Portland M.D.   On: 04/01/2014 14:32    Medications:  Prior to Admission:  Prescriptions prior to admission  Medication Sig Dispense Refill  . apixaban (ELIQUIS) 5 MG TABS tablet Take 5 mg by mouth 2 (two) times daily.      Marland Kitchen aspirin EC 81 MG tablet Take 81 mg by mouth daily.      Marland Kitchen atorvastatin (LIPITOR) 20 MG tablet Take 10 mg by mouth at bedtime.      . hydrochlorothiazide (MICROZIDE) 12.5 MG capsule Take 12.5 mg by mouth daily.      Marland Kitchen latanoprost (XALATAN) 0.005 % ophthalmic solution Place 1 drop into the left eye at bedtime.      Marland Kitchen lisinopril (PRINIVIL,ZESTRIL) 20 MG tablet Take 20 mg by  mouth 2 (two) times daily.      . metoprolol (LOPRESSOR) 50 MG tablet Take 25 mg by mouth 2 (two) times daily. Takes with Lisinopril      . Multiple Vitamin (MULTIVITAMIN WITH MINERALS) TABS tablet Take 1 tablet by mouth daily.      . tadalafil (CIALIS) 20 MG tablet Take 20 mg by mouth daily as needed for erectile dysfunction.       Scheduled: . antiseptic oral rinse  15 mL Mouth Rinse BID  . apixaban  5 mg Oral BID  . aspirin EC  81 mg Oral Daily  .  atorvastatin  10 mg Oral QHS  . hydrochlorothiazide  12.5 mg Oral Daily  . insulin aspart  0-15 Units Subcutaneous TID WC  . insulin aspart  0-5 Units Subcutaneous QHS  . latanoprost  1 drop Left Eye QHS  . lisinopril  20 mg Oral BID  . methylPREDNISolone (SOLU-MEDROL) injection  125 mg Intravenous Q6H  . metoprolol  25 mg Oral BID  . multivitamin with minerals  1 tablet Oral Daily   Continuous: . sodium chloride 10 mL/hr at 04/01/14 1350   ZOX:[id number]:ondansetron (ZOFRAN) IV, ondansetron  Assesment: He has pulmonary fibrosis which is relatively recently discovered. He does not complain of shortness of breath but of being tired. He has atrial fibrillation which is well controlled. He has been hypoxic. Active Problems:   Hypertension   Atrial fibrillation   Type II or unspecified type diabetes mellitus without mention of complication, uncontrolled   Pulmonary fibrosis    Plan: I agree that a trial of steroids is indicated. He is considering the use of the new medication approved for pulmonary fibrosis,OFEV . He needs to ambulate with oxygen on to see if his hypoxia is resolved while he is exerting himself. Thanks for allow me to see him with you    LOS: 1 day   HAWKINS,EDWARD L 04/02/2014, 9:03 AM

## 2014-04-02 NOTE — Progress Notes (Signed)
  Inpatient Diabetes Program Recommendations:  AACE/ADA: New Consensus Statement on Inpatient Glycemic Control (2013)  Target Ranges:  Prepandial:   less than 140 mg/dL      Peak postprandial:   less than 180 mg/dL (1-2 hours)      Critically ill patients:  140 - 180 mg/dL      Results for [place] PoliGWYNN, Dajohn (MRN [id number]) as of 04/02/2014 12:00  Ref. Range 04/01/2014 20:53 04/02/2014 07:30 04/02/2014 11:48  Glucose-Capillary Latest Range: 70-99 mg/dL 409219 (H) 811232 (H) 914241 (H)    04/02/14- Note elevated glucose with IV steroids. History of diabetes, diet controlled.  Novolog moderate tid with meals and HS started last night.  Please consider adding mealtime insulin- Novolog 3 units with meals. If FBS >200mg /dl tomorrow, may want to consider adding basal insulin 10-15units Levemir qhs.    Susette RacerJulie Montpellier, RN, BA, MHA, CDE Diabetes Coordinator Inpatient Diabetes Program  [phone] (Team Pager) [phone] Patrcia Dolly([place] Office) 04/02/2014 12:25 PM

## 2014-04-02 NOTE — Progress Notes (Signed)
04/02/14 1642 Notified Dr. Kerry HoughMemon of patient ambulation tolerance and O2 sats at rest and during ambulation. Pt O2 sats 94% on r/a at rest at this time. States comfortable, no c/o discomfort or dyspnea. Earnstine RegalAshley Rogers, RN

## 2014-04-02 NOTE — Progress Notes (Signed)
[facility] PROGRESS NOTE  Hartford Polirthur Meinders UJW:[id number] DOB: 08/03/1941 DOA: 04/01/2014 PCP: Quinn AxeOBERTSON, ANTHONY T, PA-C  Assessment/Plan: 1. Acute respiratory failure, likely related to pulmonary fibrosis. May need to discharge home on supplemental oxygen. Currently, he is desaturating on ambulation. 2. Pulmonary fibrosis. Started on intravenous steroids. Appreciate pulmonology assistance. We'll likely transition to prednisone tomorrow. He appears to be approaching his baseline. 3. Chronic atrial fibrillation, rate controlled with oral Lopressor. He is on anticoagulation 4.  hypertension. Stable 5. Hyperglycemia. Likely related to steroids.  Code Status: Full code Family Communication: Discussed with patient Disposition Plan: Discharge home once improved   Consultants:  Pulmonology  Procedures:    Antibiotics:    HPI/Subjective: Shortness of breath is improving. Feels better. Wants to go home  Objective: Filed Vitals:   04/02/14 1506  BP: 159/82  Pulse: 70  Temp:   Resp: 24    Intake/Output Summary (Last 24 hours) at 04/02/14 1857 Last data filed at 04/02/14 1734  Gross per 24 hour  Intake    840 ml  Output   1200 ml  Net   -360 ml   Filed Weights   04/01/14 1311 04/01/14 2014  Weight: 77.111 kg (170 lb) 77.111 kg (170 lb)    Exam:   General:  No acute distress  Cardiovascular: S1, S2, irregular  Respiratory: Fine crackles at bases bilaterally  Abdomen: Soft, nontender, positive bowel sounds  Musculoskeletal: No pedal edema bilaterally   Data Reviewed: Basic Metabolic Panel:  Recent Labs Lab 04/01/14 1350 04/02/14 0525  NA 139 137  K 4.7 5.0  CL 101 98  CO2 24 24  GLUCOSE 198* 235*  BUN 35* 34*  CREATININE 1.53* 1.35  CALCIUM 9.7 9.1   Liver Function Tests:  Recent Labs Lab 04/01/14 1350 04/02/14 0525  AST 26 20  ALT 22 17  ALKPHOS 56 54  BILITOT 0.7 1.0  PROT 8.0 7.5  ALBUMIN 3.8 3.5   No results found for this  basename: LIPASE, AMYLASE,  in the last 168 hours No results found for this basename: AMMONIA,  in the last 168 hours CBC:  Recent Labs Lab 04/01/14 1350 04/02/14 0525  WBC 9.0 8.7  NEUTROABS 7.2  --   HGB 16.1 15.9  HCT 46.6 46.7  MCV 97.3 96.5  PLT 159 165   Cardiac Enzymes:  Recent Labs Lab 04/01/14 1350 04/01/14 1824 04/02/14 0021 04/02/14 0525  TROPONINI <0.30 <0.30 <0.30 <0.30   BNP (last 3 results)  Recent Labs  11/05/13 0456 04/01/14 1350 04/02/14 0525  PROBNP 2570.0* 1414.0* 1543.0*   CBG:  Recent Labs Lab 04/01/14 2053 04/02/14 0730 04/02/14 1148 04/02/14 1630  GLUCAP 219* 232* 241* 251*    No results found for this or any previous visit (from the past 240 hour(s)).   Studies: Dg Chest 2 View  04/01/2014   CLINICAL DATA:  Cough and congestion.  EXAM: CHEST  2 VIEW  COMPARISON:  04/01/2014  FINDINGS: There are changes of interstitial fibrosis, which predominate in the lung bases. No acute lung consolidation. No evidence of edema. No pneumothorax or pleural effusion.  Cardiac silhouette is borderline enlarged. No mediastinal or hilar masses or evidence of adenopathy.  Bony thorax is demineralized but grossly intact.  IMPRESSION: 1. No convincing acute cardiopulmonary disease. 2. Pulmonary fibrosis. No change from the study performed earlier this same date.   Electronically Signed   By: Amie Portlandavid  Ormond M.D.   On: 04/01/2014 14:32    Scheduled Meds: . antiseptic oral rinse  15 mL Mouth Rinse BID  . apixaban  5 mg Oral BID  . aspirin EC  81 mg Oral Daily  . atorvastatin  10 mg Oral QHS  . hydrochlorothiazide  12.5 mg Oral Daily  . insulin aspart  0-15 Units Subcutaneous TID WC  . insulin aspart  0-5 Units Subcutaneous QHS  . latanoprost  1 drop Left Eye QHS  . lisinopril  20 mg Oral BID  . methylPREDNISolone (SOLU-MEDROL) injection  125 mg Intravenous Q6H  . metoprolol  25 mg Oral BID  . multivitamin with minerals  1 tablet Oral Daily   Continuous  Infusions: . sodium chloride 10 mL/hr at 04/01/14 1350    Active Problems:   Hypertension   Atrial fibrillation   Type II or unspecified type diabetes mellitus without mention of complication, uncontrolled   Pulmonary fibrosis    Time spent: 25mins    [facility] Surgicenter LLCMEMON,Tan Clopper  [facility] Pager [phone]. If 7PM-7AM, please contact night-coverage at [email or web address], password [email or web address] 04/02/2014, 6:57 PM  LOS: 1 day

## 2014-04-02 NOTE — Care Management Utilization Note (Signed)
UR completed 

## 2014-04-02 NOTE — Progress Notes (Addendum)
04/02/14 1543 Patient O2 sat 94% on r/a at rest this afternoon. O2 sat 83% on r/a while ambulating with nurse tech assist. Patient assisted back to bed, O2 sat 94% on r/a at rest. Earnstine RegalAshley Rogers, RN

## 2014-04-03 LAB — GLUCOSE, CAPILLARY
Glucose-Capillary: 258 mg/dL — ABNORMAL HIGH (ref 70–99)
Glucose-Capillary: 203 mg/dL — ABNORMAL HIGH (ref 70–99)

## 2014-04-03 MED ORDER — PREDNISONE 20 MG PO TABS: ORAL_TABLET | ORAL | Status: DC

## 2014-04-03 NOTE — Discharge Instructions (Signed)
Idiopathic Pulmonary Fibrosis Idiopathic pulmonary fibrosis is an inflammation (soreness and irritation) in the lungs which eventually causes scarring. This usually shows in middle age over a several year time period. The cause is unknown (idiopathic). Usually death occurs after several years but there are no time tables which will predict perfectly what the course of the illness will be. It affects males and females equally. In this condition there is a formation of fibrous (tough leathery) tissue in the small ducts which carry air to and from your lungs. Because of this, the lungs do not work as well as they should for taking in oxygen from the air you breathe and getting rid of wastes (carbon dioxide). Because the lungs are damaged, there may be more problems with infections or the heart.  Other problems may include pulmonary hypertension (high blood pressure in the lungs), and formation of blood clots. Some symptoms of this illness are:  Coughing and breathing difficulties; cough is usually dry and hacking.  Bluish (cyanotic) skin and lips due to lack of circulating oxygen.  Loss of appetite.  Loss of strength which comes from just the increased work of breathing.  Rapid, shallow breathing occur with moderate exercise and later even while resting.  Increasing shortness of breath (dyspnea) which progresses as the disease gets worse.  Weight loss and fatigue due partly to the increased work of breathing.  Clubbing of the fingers (the ends of the fingers become rounded and enlarged). DIAGNOSIS  A diagnosis of idiopathic pulmonary fibrosis may be suspected based on exam and a patient's history.  Specialized X-rays, pulmonary function tests, pulse oximetry, and laboratory tests including blood gasses help confirm the problem.  Sometimes a biopsy is done and a small piece of lung tissue is removed. This may be done through a bronchoscope or during an operation in which the chest is opened. This  is looked at under a microscope by a specialist who can tell what the lung problem is. CAUSES If the cause of pulmonary fibrosis is known, it is no longer known as idiopathic. Several causes of pulmonary fibrosis include:  Occupational and environmental exposures to asbestos, silica, and or metal dusts.  Illegal or street drug use.  Agricultural workers may inhale substances, such as moldy hay, which can cause an allergic reaction in the lung. This reaction is called Farmer's Lung and can cause pulmonary fibrosis. Some other fumes found on farms are directly toxic to the lungs.  Exposure of the lungs to radiation.  Collagen diseases.  Sarcoidosis is a disease which forms granulomas (areas of inflammatory cells), which can attack any area of the body but most frequently affects the lungs.  Drugs. Certain medicines may have the undesirable side effect of causing pulmonary fibrosis. Check with your doctor about the medicines you are taking and ask about any possible side effects.  Some cases of pulmonary fibrosis seem to be genetic. TREATMENT   There are no drugs currently approved for the treatment of pulmonary fibrosis. Steroids (a potent medication which cuts down on inflammation) are sometimes given to prevent lung changes before they become permanent. High doses may be recommended at first, followed by lower maintenance dosages. Other medications may be tried if steroids do not work.  Lung disease may be monitored with X-rays and laboratory work.  Oxygen may be helpful if oxygen in the blood is diminished. This improves the quality of life. Your caregiver will give you a prescription for this if it is helpful.  Antibiotics are used   for treatment of infections.  Exercise may be beneficial.  Lung transplants are being investigated and a single lung transplant may be considered for some patients.  Influenza vaccine and pneumococcal pneumonia vaccine are both recommended for people  with IPF or any lung disease. These two shots may help keep you healthy. Document Released: 12/22/2003 Document Revised: 12/24/2011 Document Reviewed: 10/01/2005 Exit[employer] Patient Information 2015 [employer]. This information is not intended to replace advice given to you by your health care provider. Make sure you discuss any questions you have with your health care provider.  

## 2014-04-03 NOTE — Progress Notes (Signed)
He says he feels well and is hopeful of being discharged today. He has been able to ambulate. He will be checked again today to see if he needs oxygen at home with exertion.  He is awake and alert and looks comfortable. His chest shows some rales.  I discussed his situation with Dr. Kerry HoughMemon . He will be discharged home on prednisone and I think probably 40 mg x7 days then 30 mg x7 days then 20 mg x7 days and then reassess. I'm going to see if I can get him started on the newly approved medication for pulmonary fibrosis OFEV

## 2014-04-03 NOTE — Progress Notes (Signed)
Shift Summary (7p-7a): Pt remained alert and oriented. Pt rested well throughout the shift. Denies complaints of pain, n/v, or SOB. O2 sats maintained on room air. IV solu-medrol given q6 hours as ordered. Blood sugars monitored and managed, remained <200. Tolerating PO intake well. Safety maintained, no falls/injuries this shift. Pt expresses hope to return home today, otherwise no concerns at this time. Vital signs stable, will continue plan of care.  Rolm GalaDarlene Braggs, RN-BC 04/03/2014 6:34 AM

## 2014-04-03 NOTE — Discharge Summary (Signed)
Physician Discharge Summary  Hartford Polirthur Yaney ZOX:[id number] DOB: 05/19/1941 DOA: 04/01/2014  PCP: Quinn AxeOBERTSON, ANTHONY T, PA-C  Admit date: 04/01/2014 Discharge date: 04/03/2014  Time spent: 40 minutes  Recommendations for Outpatient Follow-up:  1. Patient be discharged home on supplemental oxygen at 2 L. 2. Followup with Dr. Juanetta GoslingHawkins in one to 2 weeks  Discharge Diagnoses:  Active Problems:   Hypertension   Atrial fibrillation   Type II or unspecified type diabetes mellitus without mention of complication, uncontrolled   Pulmonary fibrosis  acute respiratory failure with hypoxia  Discharge Condition: stable  Diet recommendation: low salt  Filed Weights   04/01/14 1311 04/01/14 2014  Weight: 77.111 kg (170 lb) 77.111 kg (170 lb)    History of present illness:  This is a 73 year old man who presents to the emergency room after he was seen by his physician, who advised him to come to the ER. He was found to have low oxygen saturation in the office. He describes unchanged cough productive of sputum for the past few weeks but the sputum is white and not green/yellow. He also describes fatigue with exertion. In the emergency room he was desaturating to 88% on room air. He apparently had been admitted in February of this year with pneumonia. However pulmonary function tests done in February showed the presence of changes consistent with pulmonary fibrosis, restrictive defect. He is now being admitted for further management.   Hospital Course:  This patient was admitted to hospital with shortness of breath and hypoxia. He was found to be desaturating to 88% on room air. He recently had pulmonary function tests done which indicated a restricted defect consistent with pulmonary fibrosis. The patient was admitted and started on intravenous steroids. His respiratory status has since improved. He still required supplemental oxygen but is no longer short of breath. He was followed by pulmonology during  his hospital stay. We placed on a slow taper of steroids. Dr. Juanetta GoslingHawkins will help arrange for the patient to be started on a tyrosine kinase inhibitor, OFEV, which is being used in pulmonary fibrosis. Patient is ambulating without difficulty on oxygen and is ready for discharge home. He'll follow up with Dr. Juanetta GoslingHawkins in one to 2 weeks.  Procedures:    Consultations:  Pulmonology, Dr. Juanetta GoslingHawkins  Discharge Exam: Filed Vitals:   04/03/14 0649  BP: 115/71  Pulse: 60  Temp: 97.5 F (36.4 C)  Resp: 20    General: NAD Cardiovascular: S1, S2 RRR, no edema Respiratory: scattered crackles at bases  Discharge Instructions You were cared for by a hospitalist during your hospital stay. If you have any questions about your discharge medications or the care you received while you were in the hospital after you are discharged, you can call the unit and asked to speak with the hospitalist on call if the hospitalist that took care of you is not available. Once you are discharged, your primary care physician will handle any further medical issues. Please note that NO REFILLS for any discharge medications will be authorized once you are discharged, as it is imperative that you return to your primary care physician (or establish a relationship with a primary care physician if you do not have one) for your aftercare needs so that they can reassess your need for medications and monitor your lab values.  Discharge Instructions   Call MD for:  difficulty breathing, headache or visual disturbances    Complete by:  As directed      Call MD for:  temperature >  100.4    Complete by:  As directed      Diet - low sodium heart healthy    Complete by:  As directed      For home use only DME oxygen    Complete by:  As directed   Mode or (Route):  Nasal cannula  Liters per Minute:  2  Frequency:  Continuous (stationary and portable oxygen unit needed)  Oxygen conserving device:  Yes  Oxygen delivery system:  Gas      Increase activity slowly    Complete by:  As directed             Medication List         apixaban 5 MG Tabs tablet  Commonly known as:  ELIQUIS  Take 5 mg by mouth 2 (two) times daily.     aspirin EC 81 MG tablet  Take 81 mg by mouth daily.     atorvastatin 20 MG tablet  Commonly known as:  LIPITOR  Take 10 mg by mouth at bedtime.     hydrochlorothiazide 12.5 MG capsule  Commonly known as:  MICROZIDE  Take 12.5 mg by mouth daily.     latanoprost 0.005 % ophthalmic solution  Commonly known as:  XALATAN  Place 1 drop into the left eye at bedtime.     lisinopril 20 MG tablet  Commonly known as:  PRINIVIL,ZESTRIL  Take 20 mg by mouth 2 (two) times daily.     metoprolol 50 MG tablet  Commonly known as:  LOPRESSOR  Take 25 mg by mouth 2 (two) times daily. Takes with Lisinopril     multivitamin with minerals Tabs tablet  Take 1 tablet by mouth daily.     predniSONE 20 MG tablet  Commonly known as:  DELTASONE  Take 40mg  daily for 7 days then 30mg  po daily for 7 days then 20mg  po daily for 7 days then 10mg  po daily for 7 days     tadalafil 20 MG tablet  Commonly known as:  CIALIS  Take 20 mg by mouth daily as needed for erectile dysfunction.       No Known Allergies     Follow-up Information   Follow up with HAWKINS,EDWARD L, MD. Schedule an appointment as soon as possible for a visit in 2 weeks.   Specialty:  Pulmonary Disease   Contact information:   [address] [place] [id number] [phone]       Follow up with ROBERTSON, ANTHONY T, PA-C. Schedule an appointment as soon as possible for a visit in 2 weeks.   Specialty:  Physician Assistant   Contact information:   [address] [place] [id number] [phone]        The results of significant diagnostics from this hospitalization (including imaging, microbiology, ancillary and laboratory) are listed below for reference.    Significant Diagnostic Studies: Dg Chest 2  View  04/01/2014   CLINICAL DATA:  Cough and congestion.  EXAM: CHEST  2 VIEW  COMPARISON:  04/01/2014  FINDINGS: There are changes of interstitial fibrosis, which predominate in the lung bases. No acute lung consolidation. No evidence of edema. No pneumothorax or pleural effusion.  Cardiac silhouette is borderline enlarged. No mediastinal or hilar masses or evidence of adenopathy.  Bony thorax is demineralized but grossly intact.  IMPRESSION: 1. No convincing acute cardiopulmonary disease. 2. Pulmonary fibrosis. No change from the study performed earlier this same date.   Electronically Signed   By:  Amie Portland M.D.   On: 04/01/2014 14:32    Microbiology: No results found for this or any previous visit (from the past 240 hour(s)).   Labs: Basic Metabolic Panel:  Recent Labs Lab 04/01/14 1350 04/02/14 0525  NA 139 137  K 4.7 5.0  CL 101 98  CO2 24 24  GLUCOSE 198* 235*  BUN 35* 34*  CREATININE 1.53* 1.35  CALCIUM 9.7 9.1   Liver Function Tests:  Recent Labs Lab 04/01/14 1350 04/02/14 0525  AST 26 20  ALT 22 17  ALKPHOS 56 54  BILITOT 0.7 1.0  PROT 8.0 7.5  ALBUMIN 3.8 3.5   No results found for this basename: LIPASE, AMYLASE,  in the last 168 hours No results found for this basename: AMMONIA,  in the last 168 hours CBC:  Recent Labs Lab 04/01/14 1350 04/02/14 0525  WBC 9.0 8.7  NEUTROABS 7.2  --   HGB 16.1 15.9  HCT 46.6 46.7  MCV 97.3 96.5  PLT 159 165   Cardiac Enzymes:  Recent Labs Lab 04/01/14 1350 04/01/14 1824 04/02/14 0021 04/02/14 0525  TROPONINI <0.30 <0.30 <0.30 <0.30   BNP: BNP (last 3 results)  Recent Labs  11/05/13 0456 04/01/14 1350 04/02/14 0525  PROBNP 2570.0* 1414.0* 1543.0*   CBG:  Recent Labs Lab 04/02/14 1148 04/02/14 1630 04/02/14 2051 04/03/14 0747 04/03/14 1239  GLUCAP 241* 251* 187* 258* 203*       Signed:  Jacobs Golab  [facility] 04/03/2014, 1:45 PM

## 2014-04-03 NOTE — Plan of Care (Signed)
Problem: Discharge Progression Outcomes Goal: Home O2 if indicated Outcome: Completed/Met Date Met:  04/03/14 04/03/14 1532 Home O2 arranged through [employer] Portable O2 tank delivered for transport home. Instructed patient to use O2 at 2 lpm as instructed per MD due to O2 sat dropping while ambulating. Pt stated "I feel okay, I don't have to use it all the time". Reminded patient O2 is ordered continuously, needs to have available at all times in case needed.  Pt stated "okay, I'll keep it close but can't wear it all the time". O2 tank with patient at discharge, pt stated "I'm not wearing it right now I feel okay". Donavan Foil, RN

## 2014-04-03 NOTE — Progress Notes (Signed)
04/03/14 1537 Reviewed discharge instructions with patient, given copy of AVS with f/u appointment information and when home medications next due. Prednisone prescription given, instructed to have filled today. Stated "ill take it to [facility] because my pharmacy closed already today". Reviewed pulmonary fibrosis education sheet, s/s, when to seek medical attention. Pt verbalized understanding. Discussed use of home O2, pt demonstrated correct use and turning O2 tank on/off. Instructed may not have anyone smoking near O2 tank. Verbalized understanding. IV site d/c'd per nurse tech, within normal limits. No c/o pain or dyspnea at time of discharge. Pt left floor in stable condition via w/c accompanied by nurse tech. Earnstine RegalAshley Rogers, RN

## 2014-04-03 NOTE — Progress Notes (Signed)
04/03/14 1315 Patient ambulated approximately 200 feet in hallway, with nurse standby assist. O2 sat 97% on r/a at rest. O2 sat dropped to 83-85% range on r/a during ambulation. Pt denied shortness of breath, pain, dizziness, or discomfort during ambulation. O2 sat 92% on r/a at rest. Notified Dr. Kerry HoughMemon. Earnstine RegalAshley Rogers, RN

## 2014-04-30 ENCOUNTER — Encounter (HOSPITAL_COMMUNITY): Payer: Self-pay | Admitting: Emergency Medicine

## 2014-04-30 ENCOUNTER — Inpatient Hospital Stay (HOSPITAL_COMMUNITY): Payer: Medicare Other

## 2014-04-30 ENCOUNTER — Inpatient Hospital Stay (HOSPITAL_COMMUNITY)
Admission: EM | Admit: 2014-04-30 | Discharge: 2014-05-03 | DRG: 638 | Disposition: A | Discharge status: Home Health | Payer: Medicare Other | Attending: Internal Medicine | Admitting: Internal Medicine

## 2014-04-30 ENCOUNTER — Emergency Department (HOSPITAL_COMMUNITY): Payer: Medicare Other

## 2014-04-30 DIAGNOSIS — Z8701 Personal history of pneumonia (recurrent): Secondary | ICD-10-CM | POA: Diagnosis not present

## 2014-04-30 DIAGNOSIS — I10 Essential (primary) hypertension: Secondary | ICD-10-CM | POA: Diagnosis present

## 2014-04-30 DIAGNOSIS — R5381 Other malaise: Secondary | ICD-10-CM | POA: Diagnosis present

## 2014-04-30 DIAGNOSIS — I959 Hypotension, unspecified: Secondary | ICD-10-CM | POA: Diagnosis present

## 2014-04-30 DIAGNOSIS — E1100 Type 2 diabetes mellitus with hyperosmolarity without nonketotic hyperglycemic-hyperosmolar coma (NKHHC): Principal | ICD-10-CM | POA: Diagnosis present

## 2014-04-30 DIAGNOSIS — I4891 Unspecified atrial fibrillation: Secondary | ICD-10-CM | POA: Diagnosis present

## 2014-04-30 DIAGNOSIS — Z7982 Long term (current) use of aspirin: Secondary | ICD-10-CM | POA: Diagnosis not present

## 2014-04-30 DIAGNOSIS — I482 Chronic atrial fibrillation, unspecified: Secondary | ICD-10-CM

## 2014-04-30 DIAGNOSIS — D696 Thrombocytopenia, unspecified: Secondary | ICD-10-CM | POA: Diagnosis present

## 2014-04-30 DIAGNOSIS — R739 Hyperglycemia, unspecified: Secondary | ICD-10-CM | POA: Insufficient documentation

## 2014-04-30 DIAGNOSIS — J8410 Pulmonary fibrosis, unspecified: Secondary | ICD-10-CM | POA: Diagnosis present

## 2014-04-30 DIAGNOSIS — J9610 Chronic respiratory failure, unspecified whether with hypoxia or hypercapnia: Secondary | ICD-10-CM | POA: Diagnosis present

## 2014-04-30 DIAGNOSIS — E860 Dehydration: Secondary | ICD-10-CM | POA: Diagnosis present

## 2014-04-30 DIAGNOSIS — E871 Hypo-osmolality and hyponatremia: Secondary | ICD-10-CM | POA: Diagnosis present

## 2014-04-30 DIAGNOSIS — T380X5A Adverse effect of glucocorticoids and synthetic analogues, initial encounter: Secondary | ICD-10-CM | POA: Diagnosis present

## 2014-04-30 DIAGNOSIS — E1101 Type 2 diabetes mellitus with hyperosmolarity with coma: Secondary | ICD-10-CM | POA: Insufficient documentation

## 2014-04-30 DIAGNOSIS — N390 Urinary tract infection, site not specified: Secondary | ICD-10-CM | POA: Diagnosis present

## 2014-04-30 DIAGNOSIS — I5032 Chronic diastolic (congestive) heart failure: Secondary | ICD-10-CM | POA: Diagnosis present

## 2014-04-30 DIAGNOSIS — I509 Heart failure, unspecified: Secondary | ICD-10-CM | POA: Diagnosis present

## 2014-04-30 DIAGNOSIS — R5383 Other fatigue: Secondary | ICD-10-CM | POA: Diagnosis not present

## 2014-04-30 DIAGNOSIS — E861 Hypovolemia: Secondary | ICD-10-CM | POA: Diagnosis present

## 2014-04-30 DIAGNOSIS — Z79899 Other long term (current) drug therapy: Secondary | ICD-10-CM | POA: Diagnosis not present

## 2014-04-30 DIAGNOSIS — E782 Mixed hyperlipidemia: Secondary | ICD-10-CM | POA: Diagnosis present

## 2014-04-30 DIAGNOSIS — E875 Hyperkalemia: Secondary | ICD-10-CM

## 2014-04-30 DIAGNOSIS — Z9981 Dependence on supplemental oxygen: Secondary | ICD-10-CM

## 2014-04-30 DIAGNOSIS — N179 Acute kidney failure, unspecified: Secondary | ICD-10-CM | POA: Diagnosis present

## 2014-04-30 DIAGNOSIS — Z833 Family history of diabetes mellitus: Secondary | ICD-10-CM | POA: Diagnosis not present

## 2014-04-30 HISTORY — DX: Pulmonary fibrosis, unspecified: J84.10

## 2014-04-30 HISTORY — DX: Pneumonia, unspecified organism: J18.9

## 2014-04-30 HISTORY — DX: Chronic diastolic (congestive) heart failure: I50.32

## 2014-04-30 LAB — BASIC METABOLIC PANEL
ANION GAP: 15 (ref 5–15)
BUN: 40 mg/dL — ABNORMAL HIGH (ref 6–23)
CALCIUM: 9.5 mg/dL (ref 8.4–10.5)
CO2: 23 mEq/L (ref 19–32)
Chloride: 101 mEq/L (ref 96–112)
Creatinine, Ser: 1.71 mg/dL — ABNORMAL HIGH (ref 0.50–1.35)
GFR calc non Af Amer: 37 mL/min — ABNORMAL LOW (ref >90)
GFR, EST AFRICAN AMERICAN: 43 mL/min — AB (ref >90)
Glucose, Bld: 153 mg/dL — ABNORMAL HIGH (ref 70–99)
Potassium: 4.7 mEq/L (ref 3.7–5.3)
Sodium: 139 mEq/L (ref 137–147)

## 2014-04-30 LAB — LACTIC ACID, PLASMA: Lactic Acid, Venous: 5.1 mmol/L — ABNORMAL HIGH (ref 0.5–2.2)

## 2014-04-30 LAB — COMPREHENSIVE METABOLIC PANEL
ALBUMIN: 3.4 g/dL — AB (ref 3.5–5.2)
ALT: 45 U/L (ref 0–53)
ANION GAP: 14 (ref 5–15)
AST: 27 U/L (ref 0–37)
Alkaline Phosphatase: 81 U/L (ref 39–117)
BUN: 47 mg/dL — AB (ref 6–23)
CALCIUM: 9.5 mg/dL (ref 8.4–10.5)
CO2: 25 mEq/L (ref 19–32)
Chloride: 84 mEq/L — ABNORMAL LOW (ref 96–112)
Creatinine, Ser: 2.06 mg/dL — ABNORMAL HIGH (ref 0.50–1.35)
GFR calc non Af Amer: 30 mL/min — ABNORMAL LOW (ref >90)
GFR, EST AFRICAN AMERICAN: 35 mL/min — AB (ref >90)
GLUCOSE: 954 mg/dL — AB (ref 70–99)
Potassium: 6.2 mEq/L — ABNORMAL HIGH (ref 3.7–5.3)
Sodium: 123 mEq/L — ABNORMAL LOW (ref 137–147)
TOTAL PROTEIN: 7.1 g/dL (ref 6.0–8.3)
Total Bilirubin: 0.5 mg/dL (ref 0.3–1.2)

## 2014-04-30 LAB — CBG MONITORING, ED
GLUCOSE-CAPILLARY: 510 mg/dL — AB (ref 70–99)
GLUCOSE-CAPILLARY: 391 mg/dL — AB (ref 70–99)
Glucose-Capillary: >600 mg/dL (ref 70–99)
Glucose-Capillary: 533 mg/dL — ABNORMAL HIGH (ref 70–99)

## 2014-04-30 LAB — BLOOD GAS, ARTERIAL
Acid-base deficit: 0 mmol/L (ref 0.0–2.0)
BICARBONATE: 23.7 meq/L (ref 20.0–24.0)
Drawn by: 33234
FIO2: 0.21 %
O2 Saturation: 93 %
PATIENT TEMPERATURE: 37
PCO2 ART: 35.8 mmHg (ref 35.0–45.0)
TCO2: 20.1 mmol/L (ref 0–100)
pH, Arterial: 7.436 (ref 7.350–7.450)
pO2, Arterial: 66.8 mmHg — ABNORMAL LOW (ref 80.0–100.0)

## 2014-04-30 LAB — URINALYSIS, ROUTINE W REFLEX MICROSCOPIC
BILIRUBIN URINE: NEGATIVE
Glucose, UA: >1000 mg/dL — AB
KETONES UR: NEGATIVE mg/dL
NITRITE: NEGATIVE
Specific Gravity, Urine: <1.005 — ABNORMAL LOW (ref 1.005–1.030)
UROBILINOGEN UA: 0.2 mg/dL (ref 0.0–1.0)
pH: 6 (ref 5.0–8.0)

## 2014-04-30 LAB — PROTIME-INR
INR: 1.64 — AB (ref 0.00–1.49)
Prothrombin Time: 19.4 seconds — ABNORMAL HIGH (ref 11.6–15.2)

## 2014-04-30 LAB — CBC WITH DIFFERENTIAL/PLATELET
BASOS PCT: 0 % (ref 0–1)
Basophils Absolute: 0 10*3/uL (ref 0.0–0.1)
EOS PCT: 0 % (ref 0–5)
Eosinophils Absolute: 0 10*3/uL (ref 0.0–0.7)
HCT: 51.5 % (ref 39.0–52.0)
Hemoglobin: 18 g/dL — ABNORMAL HIGH (ref 13.0–17.0)
LYMPHS ABS: 0.7 10*3/uL (ref 0.7–4.0)
Lymphocytes Relative: 11 % — ABNORMAL LOW (ref 12–46)
MCH: 33.2 pg (ref 26.0–34.0)
MCHC: 35 g/dL (ref 30.0–36.0)
MCV: 95 fL (ref 78.0–100.0)
Monocytes Absolute: 0.5 10*3/uL (ref 0.1–1.0)
Monocytes Relative: 7 % (ref 3–12)
NEUTROS PCT: 81 % — AB (ref 43–77)
Neutro Abs: 5.4 10*3/uL (ref 1.7–7.7)
PLATELETS: 111 10*3/uL — AB (ref 150–400)
RBC: 5.42 MIL/uL (ref 4.22–5.81)
RDW: 13 % (ref 11.5–15.5)
WBC: 6.7 10*3/uL (ref 4.0–10.5)

## 2014-04-30 LAB — PRO B NATRIURETIC PEPTIDE: Pro B Natriuretic peptide (BNP): 1929 pg/mL — ABNORMAL HIGH (ref 0–450)

## 2014-04-30 LAB — TROPONIN I

## 2014-04-30 LAB — GLUCOSE, CAPILLARY
Glucose-Capillary: 152 mg/dL — ABNORMAL HIGH (ref 70–99)
Glucose-Capillary: 321 mg/dL — ABNORMAL HIGH (ref 70–99)

## 2014-04-30 LAB — URINE MICROSCOPIC-ADD ON

## 2014-04-30 LAB — MRSA PCR SCREENING: MRSA by PCR: NEGATIVE

## 2014-04-30 LAB — POTASSIUM: Potassium: 6.3 mEq/L — ABNORMAL HIGH (ref 3.7–5.3)

## 2014-04-30 MED ORDER — APIXABAN 5 MG PO TABS
5.0000 mg | ORAL_TABLET | Freq: Two times a day (BID) | ORAL | Status: DC
  Administered 2014-04-30 – 2014-05-03 (×6): 5 mg via ORAL
  Filled 2014-04-30 (×6): qty 1

## 2014-04-30 MED ORDER — SODIUM CHLORIDE 0.9 % IV SOLN: INTRAVENOUS | Status: DC

## 2014-04-30 MED ORDER — LEVALBUTEROL HCL 0.63 MG/3ML IN NEBU: 0.6300 mg | INHALATION_SOLUTION | RESPIRATORY_TRACT | Status: DC | PRN

## 2014-04-30 MED ORDER — LATANOPROST 0.005 % OP SOLN
1.0000 [drp] | Freq: Every day | OPHTHALMIC | Status: DC
  Administered 2014-04-30 – 2014-05-02 (×3): 1 [drp] via OPHTHALMIC
  Filled 2014-04-30: qty 2.5

## 2014-04-30 MED ORDER — CEFTRIAXONE SODIUM 1 G IJ SOLR
1.0000 g | Freq: Once | INTRAMUSCULAR | Status: AC
  Administered 2014-04-30: 1 g via INTRAVENOUS
  Filled 2014-04-30: qty 10

## 2014-04-30 MED ORDER — INSULIN ASPART 100 UNIT/ML ~~LOC~~ SOLN
0.0000 [IU] | Freq: Every day | SUBCUTANEOUS | Status: DC
  Administered 2014-04-30: 4 [IU] via SUBCUTANEOUS

## 2014-04-30 MED ORDER — INSULIN ASPART 100 UNIT/ML ~~LOC~~ SOLN
0.0000 [IU] | Freq: Three times a day (TID) | SUBCUTANEOUS | Status: DC
  Administered 2014-05-01: 9 [IU] via SUBCUTANEOUS
  Administered 2014-05-01: 1 [IU] via SUBCUTANEOUS
  Administered 2014-05-02: 9 [IU] via SUBCUTANEOUS
  Administered 2014-05-02: 5 [IU] via SUBCUTANEOUS
  Administered 2014-05-02: 2 [IU] via SUBCUTANEOUS
  Administered 2014-05-03 (×2): 5 [IU] via SUBCUTANEOUS

## 2014-04-30 MED ORDER — SODIUM CHLORIDE 0.9 % IV SOLN
1.0000 g | Freq: Once | INTRAVENOUS | Status: AC
  Administered 2014-04-30: 1 g via INTRAVENOUS
  Filled 2014-04-30: qty 10

## 2014-04-30 MED ORDER — ALUM & MAG HYDROXIDE-SIMETH 200-200-20 MG/5ML PO SUSP: 30.0000 mL | Freq: Four times a day (QID) | ORAL | Status: DC | PRN

## 2014-04-30 MED ORDER — BISACODYL 10 MG RE SUPP: 10.0000 mg | Freq: Every day | RECTAL | Status: DC | PRN

## 2014-04-30 MED ORDER — DEXTROSE 5 % IV SOLN
1.0000 g | INTRAVENOUS | Status: DC
  Administered 2014-05-01 – 2014-05-02 (×2): 1 g via INTRAVENOUS
  Filled 2014-04-30 (×3): qty 10

## 2014-04-30 MED ORDER — APIXABAN 5 MG PO TABS: 5.0000 mg | ORAL_TABLET | Freq: Two times a day (BID) | ORAL | Status: DC

## 2014-04-30 MED ORDER — ACETAMINOPHEN 650 MG RE SUPP: 650.0000 mg | Freq: Four times a day (QID) | RECTAL | Status: DC | PRN

## 2014-04-30 MED ORDER — SODIUM CHLORIDE 0.9 % IV SOLN
INTRAVENOUS | Status: DC
  Administered 2014-04-30: 13:00:00 via INTRAVENOUS
  Filled 2014-04-30: qty 1

## 2014-04-30 MED ORDER — SODIUM CHLORIDE 0.9 % IV BOLUS (SEPSIS)
1000.0000 mL | Freq: Once | INTRAVENOUS | Status: AC
  Administered 2014-04-30: 1000 mL via INTRAVENOUS

## 2014-04-30 MED ORDER — ACETAMINOPHEN 325 MG PO TABS: 650.0000 mg | ORAL_TABLET | Freq: Four times a day (QID) | ORAL | Status: DC | PRN

## 2014-04-30 MED ORDER — SODIUM BICARBONATE 8.4 % IV SOLN
50.0000 meq | Freq: Once | INTRAVENOUS | Status: AC
  Administered 2014-04-30: 50 meq via INTRAVENOUS
  Filled 2014-04-30: qty 50

## 2014-04-30 MED ORDER — ONDANSETRON HCL 4 MG PO TABS: 4.0000 mg | ORAL_TABLET | Freq: Four times a day (QID) | ORAL | Status: DC | PRN

## 2014-04-30 MED ORDER — GUAIFENESIN-DM 100-10 MG/5ML PO SYRP: 5.0000 mL | ORAL_SOLUTION | ORAL | Status: DC | PRN

## 2014-04-30 MED ORDER — METOPROLOL TARTRATE 25 MG PO TABS
25.0000 mg | ORAL_TABLET | Freq: Two times a day (BID) | ORAL | Status: DC
  Administered 2014-04-30 – 2014-05-03 (×6): 25 mg via ORAL
  Filled 2014-04-30 (×6): qty 1

## 2014-04-30 MED ORDER — HYDROCODONE-ACETAMINOPHEN 5-325 MG PO TABS: 1.0000 | ORAL_TABLET | ORAL | Status: DC | PRN

## 2014-04-30 MED ORDER — INSULIN ASPART 100 UNIT/ML ~~LOC~~ SOLN: 0.0000 [IU] | SUBCUTANEOUS | Status: DC

## 2014-04-30 MED ORDER — SODIUM CHLORIDE 0.9 % IV SOLN
INTRAVENOUS | Status: DC
  Administered 2014-04-30 – 2014-05-02 (×4): via INTRAVENOUS

## 2014-04-30 MED ORDER — ONDANSETRON HCL 4 MG/2ML IJ SOLN: 4.0000 mg | Freq: Four times a day (QID) | INTRAMUSCULAR | Status: DC | PRN

## 2014-04-30 MED ORDER — ATORVASTATIN CALCIUM 10 MG PO TABS
10.0000 mg | ORAL_TABLET | Freq: Every day | ORAL | Status: DC
  Administered 2014-04-30 – 2014-05-02 (×3): 10 mg via ORAL
  Filled 2014-04-30 (×3): qty 1

## 2014-04-30 NOTE — ED Notes (Signed)
Patient to be transferred to CCU. [facility]

## 2014-04-30 NOTE — Progress Notes (Signed)
ANTIBIOTIC CONSULT NOTE - INITIAL  Pharmacy Consult for Rocephin Indication: UTI  No Known Allergies  Patient Measurements: Height: 5\' 11"  (180.3 cm) Weight: 155 lb (70.308 kg) IBW/kg (Calculated) : 75.3  Vital Signs: Temp: 98.6 F (37 C) (07/17 1025) Temp src: Rectal (07/17 1025) BP: 86/59 mmHg (07/17 1500) Pulse Rate: 88 (07/17 1009) Intake/Output from previous day:   Intake/Output from this shift:    Labs:  Recent Labs  04/30/14 1115  WBC 6.7  HGB 18.0*  PLT 111*  CREATININE 2.06*   Estimated Creatinine Clearance: 30.8 ml/min (by C-G formula based on Cr of 2.06). No results found for this basename: VANCOTROUGH, VANCOPEAK, VANCORANDOM, GENTTROUGH, GENTPEAK, GENTRANDOM, TOBRATROUGH, TOBRAPEAK, TOBRARND, AMIKACINPEAK, AMIKACINTROU, AMIKACIN,  in the last 72 hours   Microbiology: No results found for this or any previous visit (from the past 720 hour(s)).  Medical History: Past Medical History  Diagnosis Date  . Essential hypertension, benign   . Type 2 diabetes mellitus   . Atrial fibrillation     Dr. Earna CoderZachary [facility] [place]  . Mixed hyperlipidemia   . Pulmonary fibrosis     dx 11/2013  . Pneumonia     11/2013  . Chronic diastolic heart failure     Medications:  Scheduled:    Assessment: 73 yo M admitted with HHNK to start Rocephin for suspected UTI.  He is currently afebrile with normal WBC.   Urine cx pending.  Rocephin does not require renal dose adjustment.    Rocephin 7/17>>  Goal of Therapy:  Eradicate infection.  Plan:  Rocephin 1gm IV q24h Pharmacy to sign off.  Please re-consult as needed.   Elson ClanLilliston, Geneen Dieter Michelle 04/30/2014,3:40 PM

## 2014-04-30 NOTE — ED Provider Notes (Signed)
CSN: [id number]     Arrival date & time 04/30/14  1191 History  This chart was scribed for [name] Octave, MD by Elon Spanner, ED Scribe. This patient was seen in room APA07/APA07 and the patient's care was started at 10:22 AM.    Chief Complaint  Patient presents with  . Fatigue    The history is provided by the patient. No language interpreter was used.    HPI Comments: [name] Sherman is a 73 y.o. male with a history of DM, Afib, HLD, pulmonary fibrosis who presents to the Emergency Department complaining of constant, unchanged generalized fatigue that he has been experiencing for the last week.  Patient states that he was recently taking steroids prescribed by Dr. Juanetta Gosling for pneumonia but he has concluded the course and is no longer taking the medication.  Patient has had an associated cough for the past month that is non-productive of sputum as well as a dry mouth.  Patient states that he was treated for pneumonia/bronchitis within the last 1 month and uses at-home oxygen prn.  Patient has a history of atrial fibrillation that is controlled by Eliquis; last dose was this morning.   Patient is compliant with his lisinopril and metoprolol this morning.  Patient denies CP, abdominal pain, dizziness, lightheadedness, fever, and vomiting.  Patient denies history of MI or cardiac stents.   Past Medical History  Diagnosis Date  . Essential hypertension, benign   . Type 2 diabetes mellitus   . Atrial fibrillation     Dr. [name] [facility]  . Mixed hyperlipidemia   . Pulmonary fibrosis     dx 11/2013  . Pneumonia     11/2013  . Chronic diastolic heart failure    Past Surgical History  Procedure Laterality Date  . Hernia repair    . Eye surgery     Family History  Problem Relation Age of Onset  . Diabetes Mellitus II Mother   . Diabetes Mellitus II Sister    History  Substance Use Topics  . Smoking status: Never Smoker   . Smokeless tobacco: Not on file  . Alcohol Use: Yes      Comment: Occasional    Review of Systems A complete 10 system review of systems was obtained and all systems are negative except as noted in the HPI and PMH.     Allergies  Review of patient's allergies indicates no known allergies.  Home Medications   Prior to Admission medications   Medication Sig Start Date End Date Taking? Authorizing Provider  apixaban (ELIQUIS) 5 MG TABS tablet Take 5 mg by mouth 2 (two) times daily.   Yes Historical Provider, MD  aspirin EC 81 MG tablet Take 81 mg by mouth daily.   Yes Historical Provider, MD  atorvastatin (LIPITOR) 20 MG tablet Take 10 mg by mouth at bedtime.   Yes Historical Provider, MD  Bee Pollen 550 MG CAPS Take 1 capsule by mouth at bedtime.   Yes Historical Provider, MD  latanoprost (XALATAN) 0.005 % ophthalmic solution Place 1 drop into the left eye at bedtime.   Yes Historical Provider, MD  lisinopril (PRINIVIL,ZESTRIL) 20 MG tablet Take 20 mg by mouth 2 (two) times daily.   Yes Historical Provider, MD  metoprolol (LOPRESSOR) 50 MG tablet Take 25 mg by mouth 2 (two) times daily. Takes with Lisinopril   Yes Historical Provider, MD  Multiple Vitamin (MULTIVITAMIN WITH MINERALS) TABS tablet Take 1 tablet by mouth daily.   Yes Historical Provider, MD  tadalafil (CIALIS) 20 MG tablet Take 20 mg by mouth daily as needed for erectile dysfunction.   Yes Historical Provider, MD   Triage Vitals: BP 95/64  Pulse 88  Temp(Src) 97.6 F (36.4 C) (Oral)  Resp 20  Ht 5\' 11"  (1.803 m)  Wt 155 lb (70.308 kg)  BMI 21.63 kg/m2 Physical Exam  Nursing note and vitals reviewed. Constitutional: He is oriented to person, place, and time. He appears well-developed and well-nourished. No distress.  HENT:  Head: Normocephalic and atraumatic.  Mouth/Throat: Oropharynx is clear and moist. Mucous membranes are dry. No oropharyngeal exudate.  Eyes: Conjunctivae and EOM are normal. Pupils are equal, round, and reactive to light.  Neck: Normal range of motion.  Neck supple.  No meningismus.  Cardiovascular: Normal rate, normal heart sounds and intact distal pulses.  An irregular rhythm present.  No murmur heard. Pulmonary/Chest: Effort normal and breath sounds normal. No respiratory distress.  Mild dyspnea with conversation.  Scattered rhonchi diffusely.  Abdominal: Soft. There is no tenderness. There is no rebound and no guarding.  Musculoskeletal: Normal range of motion. He exhibits no edema and no tenderness.  Neurological: He is alert and oriented to person, place, and time. No cranial nerve deficit. He exhibits normal muscle tone. Coordination normal.  No ataxia on finger to nose bilaterally. No pronator drift. 5/5 strength throughout. CN 2-12 intact. Negative Romberg. Equal grip strength. Sensation intact. Gait is normal.   Skin: Skin is warm.  Psychiatric: He has a normal mood and affect. His behavior is normal.    ED Course  Procedures (including critical care time)  DIAGNOSTIC STUDIES: Oxygen Saturation is 95% on RA, normal by my interpretation.    COORDINATION OF CARE: 10:32 AM  Discussed plan to order CXR and Labs.  Patient acknowledges and agrees with plan.   12:16 PM Discussed suspicion of DKA.  Patient reports no new pain or weakness.   Labs Review Labs Reviewed  CBC WITH DIFFERENTIAL - Abnormal; Notable for the following:    Hemoglobin 18.0 (*)    Platelets 111 (*)    Neutrophils Relative % 81 (*)    Lymphocytes Relative 11 (*)    All other components within normal limits  COMPREHENSIVE METABOLIC PANEL - Abnormal; Notable for the following:    Sodium 123 (*)    Potassium 6.2 (*)    Chloride 84 (*)    Glucose, Bld 954 (*)    BUN 47 (*)    Creatinine, Ser 2.06 (*)    Albumin 3.4 (*)    GFR calc non Af Amer 30 (*)    GFR calc Af Amer 35 (*)    All other components within normal limits  PROTIME-INR - Abnormal; Notable for the following:    Prothrombin Time 19.4 (*)    INR 1.64 (*)    All other components within  normal limits  URINALYSIS, ROUTINE W REFLEX MICROSCOPIC - Abnormal; Notable for the following:    APPearance HAZY (*)    Specific Gravity, Urine <1.005 (*)    Glucose, UA >1000 (*)    Hgb urine dipstick MODERATE (*)    Protein, ur TRACE (*)    Leukocytes, UA TRACE (*)    All other components within normal limits  PRO B NATRIURETIC PEPTIDE - Abnormal; Notable for the following:    Pro B Natriuretic peptide (BNP) 1929.0 (*)    All other components within normal limits  URINE MICROSCOPIC-ADD ON - Abnormal; Notable for the following:    Bacteria,  UA FEW (*)    All other components within normal limits  POTASSIUM - Abnormal; Notable for the following:    Potassium 6.3 (*)    All other components within normal limits  LACTIC ACID, PLASMA - Abnormal; Notable for the following:    Lactic Acid, Venous 5.1 (*)    All other components within normal limits  BLOOD GAS, ARTERIAL - Abnormal; Notable for the following:    pO2, Arterial 66.8 (*)    All other components within normal limits  BASIC METABOLIC PANEL - Abnormal; Notable for the following:    Glucose, Bld 153 (*)    BUN 40 (*)    Creatinine, Ser 1.71 (*)    GFR calc non Af Amer 37 (*)    GFR calc Af Amer 43 (*)    All other components within normal limits  GLUCOSE, CAPILLARY - Abnormal; Notable for the following:    Glucose-Capillary 152 (*)    All other components within normal limits  CBG MONITORING, ED - Abnormal; Notable for the following:    Glucose-Capillary >600 (*)    All other components within normal limits  CBG MONITORING, ED - Abnormal; Notable for the following:    Glucose-Capillary 533 (*)    All other components within normal limits  CBG MONITORING, ED - Abnormal; Notable for the following:    Glucose-Capillary 510 (*)    All other components within normal limits  CBG MONITORING, ED - Abnormal; Notable for the following:    Glucose-Capillary 391 (*)    All other components within normal limits  MRSA PCR  SCREENING  URINE CULTURE  TROPONIN I  VITAMIN B12  FOLATE RBC  BASIC METABOLIC PANEL  CBC  TSH  HEMOGLOBIN A1C    Imaging Review Dg Chest 2 View  04/30/2014   CLINICAL DATA:  Weakness and dry mouth for 1 week  EXAM: CHEST  2 VIEW  COMPARISON:  Prior chest x-ray 04/01/2014 ; chest CT 12/11/2013  FINDINGS: Stable cardiac and mediastinal contours. Atherosclerotic calcifications present in the transverse aorta. Peripheral and basilar predominant fine reticular interstitial opacities are similar compared to prior and consistent with underlying interstitial fibrosis. Relative hyperlucency in the upper lungs is also similar compared to prior and consistent with underlying centrilobular emphysema. No focal airspace consolidation, pneumothorax or other acute abnormality. No acute osseous abnormality.  IMPRESSION: Stable chest x-ray without evidence of acute cardiopulmonary process.  Background changes of emphysema and pulmonary fibrosis.   Electronically Signed   By: Malachy Moan M.D.   On: 04/30/2014 11:12   Dg Chest Portable 1 View  04/30/2014   CLINICAL DATA:  Fatigue.  EXAM: PORTABLE CHEST - 1 VIEW  COMPARISON:  PA and lateral chest 04/30/2014 and 04/01/2014. CT shows 12/11/2013.  FINDINGS: Changes of pulmonary fibrosis are again seen. Lung volumes are lower than on comparison studies with crowding of the bronchovascular structures. No consolidative process is identified. Heart size is normal. No pneumothorax or pleural effusion.  IMPRESSION: Interstitial lung disease.  No acute finding in a low volume chest.   Electronically Signed   By: Drusilla Kanner M.D.   On: 04/30/2014 13:54     EKG Interpretation   Date/Time:  Friday April 30 2014 10:12:24 EDT Ventricular Rate:  85 PR Interval:    QRS Duration: 80 QT Interval:  419 QTC Calculation: 498 R Axis:   88 Text Interpretation:  Atrial fibrillation Borderline right axis deviation  Repol abnrm, prob ischemia, anterolateral lds diffuse T  wave inversions  Confirmed by Manus GunningANCOUR  MD, Jeannett SeniorSTEPHEN [phone]([id number]) on 04/30/2014 12:04:27 PM      MDM   Final diagnoses:  HHNC (hyperglycemic hyperosmolar nonketotic coma)  Hyperkalemia  Dehydration  1 week of weakness, dry mouth, fatigue.  No fever.  Hx pulmonary fibrosis, recently on steroids for lungs. EKG with atrial fibrillation, diffuse T wave inversions.  Patient on eliquis.  Denies chest pain. Dry mucus membranes, soft BP in 90s.  Labs, IV, CXR, UA.  Blood sugar >900, with K 6.4, sodium 123, cr 2. Anion gap 14. Lactate 5. No fever, does not appear septic. Hx DM but not on meds.  Likely hyperglycemia from recent steroids. Calcium given, IVF, IV insulin, bicarb.   Hypotension in ED to 80s.  Questionable reliability as BP cuff was too large.  BP improved to 90s and 100s systolic after 2 liters IV fluids.  US shows no pericardial effusion or R heart strain.  Mentation and HR normal. Considered adrenal insufficiency due to recent steroid use but steroids were tapered appropriately.  D/w Dr. Sherrie MustacheFisher and [facility] who will admit to stepdown. Dr. Sherrie MustacheFisher agrees with holding cardiology consult at this time until electrolytes are corrected.  Patient without CP or SOB.  Troponin negative.   BP 102/56  Pulse 72  Temp(Src) 97.4 F (36.3 C) (Oral)  Resp 24  Ht 5\' 11"  (1.803 m)  Wt 158 lb 8.2 oz (71.9 kg)  BMI 22.12 kg/m2  SpO2 94%        EMERGENCY DEPARTMENT US CARDIAC EXAM "Study: Limited Ultrasound of the heart and pericardium"  INDICATIONS:Hypotension Multiple views of the heart and pericardium are obtained with a multi-frequency probe.  PERFORMED WU:[id number]:Myself  IMAGES ARCHIVED?: No  FINDINGS: No pericardial effusion, Normal contractility and Tamponade physiology absent  LIMITATIONS:  Emergent procedure  VIEWS USED: Subcostal 4 chamber and Parasternal long axis  INTERPRETATION: Cardiac activity present, Pericardial effusioin absent, Cardiac tamponade absent and Probable low  CVP  COMMENT:    CRITICAL CARE Performed by: [name] OctaveANCOUR, Bobette Leyh Total critical care time: 45 Critical care time was exclusive of separately billable procedures and treating other patients. Critical care was necessary to treat or prevent imminent or life-threatening deterioration. Critical care was time spent personally by me on the following activities: development of treatment plan with patient and/or surrogate as well as nursing, discussions with consultants, evaluation of patient's response to treatment, examination of patient, obtaining history from patient or surrogate, ordering and performing treatments and interventions, ordering and review of laboratory studies, ordering and review of radiographic studies, pulse oximetry and re-evaluation of patient's condition.   I personally performed the services described in this documentation, which was scribed in my presence. The recorded information has been reviewed and is accurate.     [name] OctaveStephen Soua Lenk, MD 04/30/14 2144

## 2014-04-30 NOTE — ED Notes (Signed)
Patient transferring to CCU

## 2014-04-30 NOTE — ED Notes (Signed)
Dr Manus Gunningancour notified of patients blood sugar of 954

## 2014-04-30 NOTE — ED Notes (Signed)
Pt co weakness and dry mouth x 1 week.

## 2014-04-30 NOTE — H&P (Signed)
[facility] History and Physical  [name] Sherman JXB:[id number] DOB: February 11, 1941 DOA: 04/30/2014  Referring physician:  PCP: [name], PA-C   Chief Complaint: generalized weakness and persistent cough  HPI: [name] Sherman is a very pleasant 73 y.o. male with past medical history of A. fib, pulmonary fibrosis, hypertension hyperlipidemia presents to the emergency department with the chief complaint of generalized weakness and persistent cough. Evaluation in the emergency department reveals hyperglycemic hyperosmolar non-ketotic state, hyperkalemia, hyponatremia, acute renal failure and urinary tract infection.  Information obtained from the patient he reports recent hospitalization for hypoxia and shortness of breath found to have pulmonary fibrosis and was discharged on home oxygen as well as a steroid taper. He states initially his respiratory status remained stable. But 5 days ago he began to feel weaker more short of breath. He went to see Dr. Juanetta Gosling his pulmonologist who recommended he stop the steroids. He had been on a very slow taper since his discharge. He denies chest pain palpitations lower extremity edema orthopnea.  He states he's had a persistent cough since he had pneumonia in January of this year that is generally worse at night. It is nonproductive. Over the last several days he has had decreased by mouth intake as he "has had a bad taste in his mouth". He denies abdominal pain nausea vomiting diarrhea dysuria hematuria frequency or urgency. He denies any fever chills or recent sick contacts.  Workup in the emergency room significant for serum glucose of 954, sodium 123 potassium 6.2 creatinine 2.06. Urinalysis concerning for urinary tract infection, chest x-ray with interstitial lung disease. No acute finding in a low volume chest. EKG with A. fib T-wave inversions. He is provided with 3 L of normal saline, 1 g of calcium gluconate intravenously 1 amp of sodium bicarbonate  intravenously and insulin drip in the emergency department. At the time of my exam he is afebrile with a somewhat soft blood pressure, nontoxic appearing and not hypoxic.  Review of Systems:  10 point review of systems completed and all systems are negative except as indicated in the history of present illness.   Past Medical History  Diagnosis Date  . Essential hypertension, benign   . Type 2 diabetes mellitus   . Atrial fibrillation     Dr. [name] [facility]  . Mixed hyperlipidemia   . Pulmonary fibrosis     dx 11/2013  . Pneumonia     11/2013  . Chronic diastolic heart failure    Past Surgical History  Procedure Laterality Date  . Hernia repair    . Eye surgery     Social History:  reports that he has never smoked. He does not have any smokeless tobacco history on file. He reports that he drinks alcohol. He reports that he does not use illicit drugs. 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 His wife is in a nursing home for the last 6 months. He does not smoke he hasn't had a drink in 2 months. He is independent with ADLs No Known Allergies  Family History  Problem Relation Age of Onset  . Diabetes Mellitus II Mother   . Diabetes Mellitus II Sister      Prior to Admission medications   Medication Sig Start Date End Date Taking? Authorizing Provider  apixaban (ELIQUIS) 5 MG TABS tablet Take 5 mg by mouth 2 (two) times daily.   Yes Historical Provider, MD  aspirin EC 81 MG tablet Take 81 mg by mouth  daily.   Yes Historical Provider, MD  atorvastatin (LIPITOR) 20 MG tablet Take 10 mg by mouth at bedtime.   Yes Historical Provider, MD  Bee Pollen 550 MG CAPS Take 1 capsule by mouth at bedtime.   Yes Historical Provider, MD  latanoprost (XALATAN) 0.005 % ophthalmic solution Place 1 drop into the left eye at bedtime.   Yes Historical Provider, MD  lisinopril (PRINIVIL,ZESTRIL) 20 MG tablet Take 20 mg by mouth 2 (two) times daily.   Yes Historical Provider, MD   metoprolol (LOPRESSOR) 50 MG tablet Take 25 mg by mouth 2 (two) times daily. Takes with Lisinopril   Yes Historical Provider, MD  Multiple Vitamin (MULTIVITAMIN WITH MINERALS) TABS tablet Take 1 tablet by mouth daily.   Yes Historical Provider, MD  tadalafil (CIALIS) 20 MG tablet Take 20 mg by mouth daily as needed for erectile dysfunction.   Yes Historical Provider, MD   Physical Exam: Filed Vitals:   04/30/14 1330  BP: 71/53  Pulse:   Temp:   Resp:     BP 71/53  Pulse 88  Temp(Src) 98.6 F (37 C) (Rectal)  Resp 20  Ht 5\' 11"  (1.803 m)  Wt 70.308 kg (155 lb)  BMI 21.63 kg/m2  SpO2 95%  General:  Thin somewhat frail appearing no acute distress Eyes: PERRL, normal lids, irises & conjunctiva ENT: Ears are clear nose without drainage oropharynx without erythema or exudate. Mucous membranes of his mouth are pink and moist Neck: no LAD, masses or thyromegaly Cardiovascular: Irregularly irregular I hear no murmur no gallop no rub there is no lower extremity edema Telemetry: A. fib Respiratory: Normal effort breath sounds with fair airflow diffuse faint wheezes. Faint crackles bilateral bases Abdomen: Flat soft positive bowel sounds throughout nontender to palpation no mass organomegaly noted Skin: no rash or induration seen on limited exam Musculoskeletal: grossly normal tone BUE/BLE Psychiatric: grossly normal mood and affect, speech fluent and appropriate Neurologic: He is clear facial symmetry anion nerves II through XII grossly intact           Labs on Admission:  Basic Metabolic Panel:  Recent Labs Lab 04/30/14 1115 04/30/14 1206  NA 123*  --   K 6.2* 6.3*  CL 84*  --   CO2 25  --   GLUCOSE 954*  --   BUN 47*  --   CREATININE 2.06*  --   CALCIUM 9.5  --    Liver Function Tests:  Recent Labs Lab 04/30/14 1115  AST 27  ALT 45  ALKPHOS 81  BILITOT 0.5  PROT 7.1  ALBUMIN 3.4*   No results found for this basename: LIPASE, AMYLASE,  in the last 168  hours No results found for this basename: AMMONIA,  in the last 168 hours CBC:  Recent Labs Lab 04/30/14 1115  WBC 6.7  NEUTROABS 5.4  HGB 18.0*  HCT 51.5  MCV 95.0  PLT 111*   Cardiac Enzymes:  Recent Labs Lab 04/30/14 1115  TROPONINI <0.30    BNP (last 3 results)  Recent Labs  04/01/14 1350 04/02/14 0525 04/30/14 1115  PROBNP 1414.0* 1543.0* 1929.0*   CBG:  Recent Labs Lab 04/30/14 1244 04/30/14 1404  GLUCAP >600* 533*    Radiological Exams on Admission: Dg Chest 2 View  04/30/2014   CLINICAL DATA:  Weakness and dry mouth for 1 week  EXAM: CHEST  2 VIEW  COMPARISON:  Prior chest x-ray 04/01/2014 ; chest CT 12/11/2013  FINDINGS: Stable cardiac and mediastinal contours. Atherosclerotic  calcifications present in the transverse aorta. Peripheral and basilar predominant fine reticular interstitial opacities are similar compared to prior and consistent with underlying interstitial fibrosis. Relative hyperlucency in the upper lungs is also similar compared to prior and consistent with underlying centrilobular emphysema. No focal airspace consolidation, pneumothorax or other acute abnormality. No acute osseous abnormality.  IMPRESSION: Stable chest x-ray without evidence of acute cardiopulmonary process.  Background changes of emphysema and pulmonary fibrosis.   Electronically Signed   By: Malachy Moan M.D.   On: 04/30/2014 11:12   Dg Chest Portable 1 View  04/30/2014   CLINICAL DATA:  Fatigue.  EXAM: PORTABLE CHEST - 1 VIEW  COMPARISON:  PA and lateral chest 04/30/2014 and 04/01/2014. CT shows 12/11/2013.  FINDINGS: Changes of pulmonary fibrosis are again seen. Lung volumes are lower than on comparison studies with crowding of the bronchovascular structures. No consolidative process is identified. Heart size is normal. No pneumothorax or pleural effusion.  IMPRESSION: Interstitial lung disease.  No acute finding in a low volume chest.   Electronically Signed   By: Drusilla Kanner M.D.   On: 04/30/2014 13:54    EKG: Independently reviewed. A. fib with T-wave inversions  Assessment/Plan Principal Problem:   HHNC (hyperglycemic hyperosmolar nonketotic coma): Likely related to steroids. Patient reports never diagnosed with diabetes in past. Will admit to step down. He was provided with 3 L of normal saline in the emergency department as well as insulin drip. Will continue insulin drip. Await ABG. His lactic acid is 5.1. Will monitor the meds every 2 hours. Transition to sliding scale once serum glucose less than 250. Active Problems:   Acute renal failure: Likely secondary to dehydration related to #1. Will provide aggressive fluid resuscitation. Will hold any nephrotoxins specifically his lisinopril. Monitor his urine output. Will recheck in the a.m.    Hypertension: Patient with somewhat soft blood pressure in the emergency department. Likely related to dehydration associated with #1. Will hold his antihypertensives. Continue IV fluids. Monitor closely   Hyponatremia: Again likely related to #1. Will provide IV fluids. Will monitor electrolytes closely. Expect sodium level to correct with resolution of #1. If not will get urine and serum osmolality.   Thrombocytopenia: Acute. May be related to dehydration. Will hold his eloquence. Will use SCDs for DVT prophylaxis. No signs symptoms of bleeding. Will recheck in the a.m. If no improvement    Chronic diastolic heart failure: Last echo January of 2015 with the EF of 65% and grade 2 diastolic dysfunction. He does not appear volume overloaded on admission. I do hear fine crackles in the bases of his lungs but I feel that her related to his pulmonary fibrosis versus edema. X-ray without evidence of  cardiopulmonary process. Will obtain daily weights monitor intake and output. home medications include Lopressor which I will hold do to soft blood pressure.    Atrial fibrillation: Currently rate controlled. He is on a beta  blocker at home which is on hold as noted above. He also is on eliquis which I will hold for now due to thrombocytopenia.    Pulmonary fibrosis: Diagnosed last month. Follows Dr. Juanetta Gosling pulmonology. Is on home oxygen. Continues with a persistent nonproductive cough.    Dehydration: See #1 and #2.   Urinary tract infection. Will provide Rocephin. Await urine culture.     Code Status: full Family Communication: full Disposition Plan: home when ready  Time spent: 75 minutes  [facility] Pager [phone]  **Disclaimer: This note  may have been dictated with voice recognition software. Similar sounding words can inadvertently be transcribed and this note may contain transcription errors which may not have been corrected upon publication of note.**

## 2014-04-30 NOTE — Progress Notes (Signed)
Discussed Diabetes as it related to his understanding of the disease and in sick times why his sugar would be elevated.  Patient able to return demonstrate the basic understanding at this time.

## 2014-04-30 NOTE — H&P (Signed)
The patient was seen and examined. His chart, vital signs, and laboratory studies were reviewed. He was discussed with nurse practitioner, Ms. Vedia CofferBlack. Agree with her assessment and findings with the exception that the patient has uncontrolled Type 2 diabetes mellitus hyperosmolar nonketotic hyperglycemia. The patient DOES NOT HAVE NONKETOTIC COMA.  Per history, the patient was diagnosed with type 2 diabetes mellitus in the past, but he had not been treated with any pharmacological medication. With his recent steroid treatment, it apparently worsened his blood glucose. His hyperosmolar state is likely the cause of his hyponatremia, hyperkalemia, and acute renal failure, with possible contribution from his urinary tract infection. His capillary blood glucose is currently 152, status post insulin drip.  1. Uncontrolled type 2 diabetes mellitus with hyperosmolar nonketotic hyperglycemia. Status post 3 L of normal saline in the ED and status post insulin drip. We'll now transition insulin therapy to q. a.c. and each bedtime sliding scale NovoLog. We'll order hemoglobin A1c. Will order diabetes education.  Hyperkalemia. This is likely the consequence of hyperglycemia in the setting of acute renal failure and ACE inhibitor therapy. Status post IV fluids, calcium gluconate, insulin, and sodium bicarbonate. Will recheck another serum potassium this evening. If it is still elevated, will add Kayexalate and small dose of Lasix.  Acute renal failure, likely secondary to prerenal azotemia in the setting of hyperosmolar hyperglycemia. Status post 3 L of normal saline in the ED. We'll continue gentle IV fluids with close monitoring of his pulmonary status given his history of diastolic heart failure.  Hyponatremia secondary to hyperglycemia and volume depletion/hypovolemia. Continue gentle normal saline.  Hypotension, secondary to hypovolemia. His blood pressure has improved, status post vigorous IV fluids. We'll  continue to hold Zestril, but will restart metoprolol at a lower dose for treatment of atrial fibrillation now that his blood pressures better.  Chronic diastolic heart failure. As noted by Ms. Black, his 2-D echocardiogram in January 2015 revealed grade 2 diastolic dysfunction and an ejection fraction of 65%. We'll continue to hydrate gently.  Chronic atrial fibrillation. He is treated chronically with metoprolol for rate control and is anticoagulated with Apixaban. He is also treated with aspirin. We'll hold aspirin and restart Apixaban.  Thrombocytopenia. This may be secondary to aspirin and Apixaban. However, will assess his thyroid function and vitamin B12 level.  Pulmonary fibrosis/chronic hypoxic respiratory failure. He was recently treated with an exacerbation with a prednisone taper per his pulmonologist, Dr. Juanetta GoslingHawkins. His pulmonary status appears to be at baseline. We'll add when necessary Xopenex.  Urinary tract infection. Rocephin was ordered and started. Urine culture pending.

## 2014-05-01 DIAGNOSIS — N179 Acute kidney failure, unspecified: Secondary | ICD-10-CM

## 2014-05-01 LAB — BASIC METABOLIC PANEL
ANION GAP: 11 (ref 5–15)
BUN: 30 mg/dL — ABNORMAL HIGH (ref 6–23)
CALCIUM: 8.2 mg/dL — AB (ref 8.4–10.5)
CO2: 24 mEq/L (ref 19–32)
Chloride: 100 mEq/L (ref 96–112)
Creatinine, Ser: 1.31 mg/dL (ref 0.50–1.35)
GFR calc non Af Amer: 52 mL/min — ABNORMAL LOW (ref >90)
GFR, EST AFRICAN AMERICAN: 60 mL/min — AB (ref >90)
Glucose, Bld: 155 mg/dL — ABNORMAL HIGH (ref 70–99)
Potassium: 5.3 mEq/L (ref 3.7–5.3)
Sodium: 135 mEq/L — ABNORMAL LOW (ref 137–147)

## 2014-05-01 LAB — CBC
HCT: 43.6 % (ref 39.0–52.0)
Hemoglobin: 15.4 g/dL (ref 13.0–17.0)
MCH: 32.6 pg (ref 26.0–34.0)
MCHC: 35.3 g/dL (ref 30.0–36.0)
MCV: 92.2 fL (ref 78.0–100.0)
PLATELETS: 125 10*3/uL — AB (ref 150–400)
RBC: 4.73 MIL/uL (ref 4.22–5.81)
RDW: 12.7 % (ref 11.5–15.5)
WBC: 6.8 10*3/uL (ref 4.0–10.5)

## 2014-05-01 LAB — GLUCOSE, CAPILLARY
GLUCOSE-CAPILLARY: 157 mg/dL — AB (ref 70–99)
GLUCOSE-CAPILLARY: 201 mg/dL — AB (ref 70–99)
Glucose-Capillary: 385 mg/dL — ABNORMAL HIGH (ref 70–99)
Glucose-Capillary: 149 mg/dL — ABNORMAL HIGH (ref 70–99)

## 2014-05-01 LAB — HEMOGLOBIN A1C
Hgb A1c MFr Bld: 14.9 % — ABNORMAL HIGH (ref <5.7)
Mean Plasma Glucose: 381 mg/dL — ABNORMAL HIGH (ref <117)

## 2014-05-01 LAB — FOLATE RBC: RBC FOLATE: 229 ng/mL — AB (ref >280)

## 2014-05-01 LAB — VITAMIN B12: Vitamin B-12: >2000 pg/mL — ABNORMAL HIGH (ref 211–911)

## 2014-05-01 LAB — TSH: TSH: 0.757 u[IU]/mL (ref 0.350–4.500)

## 2014-05-01 MED ORDER — METFORMIN HCL 500 MG PO TABS
500.0000 mg | ORAL_TABLET | Freq: Every day | ORAL | Status: DC
  Administered 2014-05-01 – 2014-05-03 (×3): 500 mg via ORAL
  Filled 2014-05-01 (×3): qty 1

## 2014-05-01 NOTE — Progress Notes (Signed)
[facility] PROGRESS NOTE  Hartford Polirthur Lipsett ZOX:[id number] DOB: 05/31/1941 DOA: 04/30/2014 PCP: Quinn AxeOBERTSON, ANTHONY T, PA-C    Code Status: Full code Family Communication: Discussed with patient, family not available Disposition Plan: Discharge to home in the next 48 hours   Consultants:  None  Procedures:  None  Antibiotics:  Ceftriaxone 7/17>>  HPI/Subjective: The patient is sitting up in bed and says he is feeling much better. He denies chest pain, chest congestion, abdominal pain, nausea, or vomiting.  Objective: Filed Vitals:   05/01/14 0750  BP:   Pulse:   Temp: 98.4 F (36.9 C)  Resp:    temperature 98.4. Pulse 70. Respiratory rate 15. Blood pressure 108/58. Oxygen saturation 98% on room air.  Intake/Output Summary (Last 24 hours) at 05/01/14 0903 Last data filed at 05/01/14 0600  Gross per 24 hour  Intake 4555.97 ml  Output    750 ml  Net 3805.97 ml   Filed Weights   04/30/14 1009 04/30/14 1718 05/01/14 0537  Weight: 70.308 kg (155 lb) 71.9 kg (158 lb 8.2 oz) 73.6 kg (162 lb 4.1 oz)    Exam:   General:  Pleasant alert 73 year old African-American man in no acute distress.  Cardiovascular: Irregular, irregular  Respiratory: Breathing is nonlabored; clear to auscultation bilaterally.  Abdomen: Positive bowel sounds, soft, nontender, nondistended.  Musculoskeletal/extremities: No pedal edema. No acute hot red joints.  Neurologic: He is alert and oriented x3. Cranial nerves II through XII are intact.   Data Reviewed: Basic Metabolic Panel:  Recent Labs Lab 04/30/14 1115 04/30/14 1206 04/30/14 1747 05/01/14 0518  NA 123*  --  139 135*  K 6.2* 6.3* 4.7 5.3  CL 84*  --  101 100  CO2 25  --  23 24  GLUCOSE 954*  --  153* 155*  BUN 47*  --  40* 30*  CREATININE 2.06*  --  1.71* 1.31  CALCIUM 9.5  --  9.5 8.2*   Liver Function Tests:  Recent Labs Lab 04/30/14 1115  AST 27  ALT 45  ALKPHOS 81  BILITOT 0.5  PROT 7.1  ALBUMIN 3.4*    No results found for this basename: LIPASE, AMYLASE,  in the last 168 hours No results found for this basename: AMMONIA,  in the last 168 hours CBC:  Recent Labs Lab 04/30/14 1115 05/01/14 0518  WBC 6.7 6.8  NEUTROABS 5.4  --   HGB 18.0* 15.4  HCT 51.5 43.6  MCV 95.0 92.2  PLT 111* 125*   Cardiac Enzymes:  Recent Labs Lab 04/30/14 1115 04/30/14 1939  TROPONINI <0.30 <0.30   BNP (last 3 results)  Recent Labs  04/01/14 1350 04/02/14 0525 04/30/14 1115  PROBNP 1414.0* 1543.0* 1929.0*   CBG:  Recent Labs Lab 04/30/14 1513 04/30/14 1614 04/30/14 1742 04/30/14 2038 05/01/14 0723  GLUCAP 510* 391* 152* 321* 157*    Recent Results (from the past 240 hour(s))  MRSA PCR SCREENING     Status: None   Collection Time    04/30/14  4:49 PM      Result Value Ref Range Status   MRSA by PCR NEGATIVE  NEGATIVE Final   Comment:            The GeneXpert MRSA Assay (FDA     approved for NASAL specimens     only), is one component of a     comprehensive MRSA colonization     surveillance program. It is not     intended to diagnose MRSA  infection nor to guide or     monitor treatment for     MRSA infections.     Studies: Dg Chest 2 View  04/30/2014   CLINICAL DATA:  Weakness and dry mouth for 1 week  EXAM: CHEST  2 VIEW  COMPARISON:  Prior chest x-ray 04/01/2014 ; chest CT 12/11/2013  FINDINGS: Stable cardiac and mediastinal contours. Atherosclerotic calcifications present in the transverse aorta. Peripheral and basilar predominant fine reticular interstitial opacities are similar compared to prior and consistent with underlying interstitial fibrosis. Relative hyperlucency in the upper lungs is also similar compared to prior and consistent with underlying centrilobular emphysema. No focal airspace consolidation, pneumothorax or other acute abnormality. No acute osseous abnormality.  IMPRESSION: Stable chest x-ray without evidence of acute cardiopulmonary process.   Background changes of emphysema and pulmonary fibrosis.   Electronically Signed   By: Malachy Moan M.D.   On: 04/30/2014 11:12   Dg Chest Portable 1 View  04/30/2014   CLINICAL DATA:  Fatigue.  EXAM: PORTABLE CHEST - 1 VIEW  COMPARISON:  PA and lateral chest 04/30/2014 and 04/01/2014. CT shows 12/11/2013.  FINDINGS: Changes of pulmonary fibrosis are again seen. Lung volumes are lower than on comparison studies with crowding of the bronchovascular structures. No consolidative process is identified. Heart size is normal. No pneumothorax or pleural effusion.  IMPRESSION: Interstitial lung disease.  No acute finding in a low volume chest.   Electronically Signed   By: Drusilla Kanner M.D.   On: 04/30/2014 13:54    Scheduled Meds: . apixaban  5 mg Oral BID  . atorvastatin  10 mg Oral QHS  . cefTRIAXone (ROCEPHIN)  IV  1 g Intravenous Q24H  . insulin aspart  0-5 Units Subcutaneous QHS  . insulin aspart  0-9 Units Subcutaneous TID WC  . latanoprost  1 drop Left Eye QHS  . metoprolol tartrate  25 mg Oral BID   Continuous Infusions: . sodium chloride 60 mL/hr at 05/01/14 0607    Assessment/plan:  Principal Problem:   Uncontrolled type 2 DM with hyperosmolar nonketotic hyperglycemia Active Problems:   Hypertension   Atrial fibrillation   Pulmonary fibrosis   Dehydration   Thrombocytopenia   Hyponatremia   Acute renal failure   Chronic diastolic heart failure   UTI (lower urinary tract infection)   1. Uncontrolled type 2 diabetes mellitus with hyperosmolar nonketotic hyperglycemia.  Status post 3 L of normal saline in the ED and status post insulin drip. His blood glucose is much better. He is now been transitioned to sliding scale NovoLog. We'll add metformin. He may only need bedtime Lantus and oral therapy with metformin. We'll continue diabetes education. We'll advance his diet to a carbohydrate modified. Hemoglobin A1c ordered and pending. Of note, the patient has a history of  diabetes, but was treated with diet alone. He was recently treated with a prednisone taper which had likely exacerbated his diabetes. Venous glucose on admission was 954.  Hyperkalemia.  This was likely the consequence of hyperglycemia in the setting of acute renal failure and ACE inhibitor therapy. Status post IV fluids, calcium gluconate, insulin, and sodium bicarbonate. His serum potassium has normalized, but still is on the high normal side. We'll continue to hold ACE inhibitor.   Acute renal failure, likely secondary to prerenal azotemia in the setting of hyperosmolar hyperglycemia.  Status post 3 L of normal saline in the ED. His renal function has nearly normalized. We'll continue gentle IV fluids with close monitoring of his  pulmonary status given his history of diastolic heart failure.   Hyponatremia secondary to hyperglycemia and volume depletion/hypovolemia. Mostly resolved following normal saline infusion. We'll continue gentle normal saline.    Hypotension, secondary to hypovolemia (history of hypertension)   His blood pressure has improved, status post vigorous IV fluids. We'll continue to hold Zestril, but metoprolol  was restarted at a lower dose for treatment of atrial fibrillation.    Chronic diastolic heart failure.  His 2-D echocardiogram in January 2015 revealed grade 2 diastolic dysfunction and an ejection fraction of 65%. We'll continue to hydrate gently. no signs of acute diastolic heart failure.   Chronic atrial fibrillation.  He is treated chronically with metoprolol for rate control and is anticoagulated with Apixaban. He is also treated with aspirin. We'll hold aspirin and continue  Apixaban.  Thrombocytopenia. This may be secondary to aspirin and Apixaban. We'll continue anti-coagulation and monitor her platelet count closely. Aspirin has been discontinued. However, will assess his thyroid function and vitamin B12 level. His vitamin B12 level is greater than 2000. TSH  is pending.   Pulmonary fibrosis/chronic hypoxic respiratory failure.  He was recently treated for an exacerbation with a prednisone taper per his pulmonologist, Dr. Juanetta Gosling. His pulmonary status appears to be at baseline. We'll add when necessary Xopenex.   Urinary tract infection. Will continue Rocephin.  Urine culture is  pending.    Time spent: 35 minutes.     [facility]  [facility] Pager [phone]. If 7PM-7AM, please contact night-coverage at [email or web address], password [facility] 05/01/2014, 9:03 AM  LOS: 1 day

## 2014-05-02 DIAGNOSIS — J8410 Pulmonary fibrosis, unspecified: Secondary | ICD-10-CM

## 2014-05-02 DIAGNOSIS — E1101 Type 2 diabetes mellitus with hyperosmolarity with coma: Secondary | ICD-10-CM

## 2014-05-02 LAB — BASIC METABOLIC PANEL
ANION GAP: 13 (ref 5–15)
BUN: 17 mg/dL (ref 6–23)
CHLORIDE: 100 meq/L (ref 96–112)
CO2: 19 meq/L (ref 19–32)
CREATININE: 1.07 mg/dL (ref 0.50–1.35)
Calcium: 8.1 mg/dL — ABNORMAL LOW (ref 8.4–10.5)
GFR calc Af Amer: 76 mL/min — ABNORMAL LOW (ref >90)
GFR calc non Af Amer: 66 mL/min — ABNORMAL LOW (ref >90)
GLUCOSE: 157 mg/dL — AB (ref 70–99)
Potassium: 5.3 mEq/L (ref 3.7–5.3)
Sodium: 132 mEq/L — ABNORMAL LOW (ref 137–147)

## 2014-05-02 LAB — GLUCOSE, CAPILLARY
GLUCOSE-CAPILLARY: 152 mg/dL — AB (ref 70–99)
Glucose-Capillary: 172 mg/dL — ABNORMAL HIGH (ref 70–99)
Glucose-Capillary: 294 mg/dL — ABNORMAL HIGH (ref 70–99)
Glucose-Capillary: 402 mg/dL — ABNORMAL HIGH (ref 70–99)

## 2014-05-02 MED ORDER — INSULIN DETEMIR 100 UNIT/ML ~~LOC~~ SOLN
20.0000 [IU] | Freq: Every day | SUBCUTANEOUS | Status: DC
  Administered 2014-05-02 – 2014-05-03 (×2): 20 [IU] via SUBCUTANEOUS
  Filled 2014-05-02 (×3): qty 0.2

## 2014-05-02 NOTE — Progress Notes (Signed)
[facility] PROGRESS NOTE  [name] Sherman ZOX:[id number] DOB: 1941-09-30 DOA: 04/30/2014 PCP: [name], PA-C    Code Status: Full code Family Communication: Discussed with patient, family not available Disposition Plan: Discharge to home in the next 24 hours if blood sugars are stable   Consultants:  None  Procedures:  None  Antibiotics:  Ceftriaxone 7/17>>  HPI/Subjective: No new complaints.  No chest pain or shortness of breath  Objective: Filed Vitals:   05/02/14 0400  BP: 107/74  Pulse: 69  Temp: 98.4 F (36.9 C)  Resp: 17    Intake/Output Summary (Last 24 hours) at 05/02/14 0910 Last data filed at 05/02/14 0600  Gross per 24 hour  Intake 2610.66 ml  Output   1652 ml  Net 958.66 ml   Filed Weights   04/30/14 1718 05/01/14 0537 05/02/14 0500  Weight: 71.9 kg (158 lb 8.2 oz) 73.6 kg (162 lb 4.1 oz) 76.9 kg (169 lb 8.5 oz)    Exam:   General:  Pleasant alert 73 year old African-American man in no acute distress.  Cardiovascular: Irregular, irregular  Respiratory: Breathing is nonlabored; coarse crackles bilaterally  Abdomen: Positive bowel sounds, soft, nontender, nondistended.  Musculoskeletal/extremities: No pedal edema. No acute hot red joints.  Neurologic: He is alert and oriented x3. Cranial nerves II through XII are intact.   Data Reviewed: Basic Metabolic Panel:  Recent Labs Lab 04/30/14 1115 04/30/14 1206 04/30/14 1747 05/01/14 0518 05/02/14 0528  NA 123*  --  139 135* 132*  K 6.2* 6.3* 4.7 5.3 5.3  CL 84*  --  101 100 100  CO2 25  --  23 24 19   GLUCOSE 954*  --  153* 155* 157*  BUN 47*  --  40* 30* 17  CREATININE 2.06*  --  1.71* 1.31 1.07  CALCIUM 9.5  --  9.5 8.2* 8.1*   Liver Function Tests:  Recent Labs Lab 04/30/14 1115  AST 27  ALT 45  ALKPHOS 81  BILITOT 0.5  PROT 7.1  ALBUMIN 3.4*   No results found for this basename: LIPASE, AMYLASE,  in the last 168 hours No results found for this  basename: AMMONIA,  in the last 168 hours CBC:  Recent Labs Lab 04/30/14 1115 05/01/14 0518  WBC 6.7 6.8  NEUTROABS 5.4  --   HGB 18.0* 15.4  HCT 51.5 43.6  MCV 95.0 92.2  PLT 111* 125*   Cardiac Enzymes:  Recent Labs Lab 04/30/14 1115 04/30/14 1939  TROPONINI <0.30 <0.30   BNP (last 3 results)  Recent Labs  04/01/14 1350 04/02/14 0525 04/30/14 1115  PROBNP 1414.0* 1543.0* 1929.0*   CBG:  Recent Labs Lab 05/01/14 0723 05/01/14 1137 05/01/14 1646 05/01/14 2131 05/02/14 0750  GLUCAP 157* 385* 149* 201* 172*    Recent Results (from the past 240 hour(s))  URINE CULTURE     Status: None   Collection Time    04/30/14  4:08 PM      Result Value Ref Range Status   Specimen Description Urine   Final   Special Requests NONE   Final   Culture  Setup Time     Final   Value: 04/30/2014 23:07     Performed at [facility] Count     Final   Value: 45,000 COLONIES/ML     Performed at [facility]   Culture     Final   Value: GRAM NEGATIVE RODS     Performed at [facility]  Report Status PENDING   Incomplete  MRSA PCR SCREENING     Status: None   Collection Time    04/30/14  4:49 PM      Result Value Ref Range Status   MRSA by PCR NEGATIVE  NEGATIVE Final   Comment:            The GeneXpert MRSA Assay (FDA     approved for NASAL specimens     only), is one component of a     comprehensive MRSA colonization     surveillance program. It is not     intended to diagnose MRSA     infection nor to guide or     monitor treatment for     MRSA infections.     Studies: Dg Chest 2 View  04/30/2014   CLINICAL DATA:  Weakness and dry mouth for 1 week  EXAM: CHEST  2 VIEW  COMPARISON:  Prior chest x-ray 04/01/2014 ; chest CT 12/11/2013  FINDINGS: Stable cardiac and mediastinal contours. Atherosclerotic calcifications present in the transverse aorta. Peripheral and basilar predominant fine reticular interstitial opacities are similar  compared to prior and consistent with underlying interstitial fibrosis. Relative hyperlucency in the upper lungs is also similar compared to prior and consistent with underlying centrilobular emphysema. No focal airspace consolidation, pneumothorax or other acute abnormality. No acute osseous abnormality.  IMPRESSION: Stable chest x-ray without evidence of acute cardiopulmonary process.  Background changes of emphysema and pulmonary fibrosis.   Electronically Signed   By: Malachy Moan M.D.   On: 04/30/2014 11:12   Dg Chest Portable 1 View  04/30/2014   CLINICAL DATA:  Fatigue.  EXAM: PORTABLE CHEST - 1 VIEW  COMPARISON:  PA and lateral chest 04/30/2014 and 04/01/2014. CT shows 12/11/2013.  FINDINGS: Changes of pulmonary fibrosis are again seen. Lung volumes are lower than on comparison studies with crowding of the bronchovascular structures. No consolidative process is identified. Heart size is normal. No pneumothorax or pleural effusion.  IMPRESSION: Interstitial lung disease.  No acute finding in a low volume chest.   Electronically Signed   By: Drusilla Kanner M.D.   On: 04/30/2014 13:54    Scheduled Meds: . apixaban  5 mg Oral BID  . atorvastatin  10 mg Oral QHS  . cefTRIAXone (ROCEPHIN)  IV  1 g Intravenous Q24H  . insulin aspart  0-5 Units Subcutaneous QHS  . insulin aspart  0-9 Units Subcutaneous TID WC  . insulin detemir  20 Units Subcutaneous Daily  . latanoprost  1 drop Left Eye QHS  . metFORMIN  500 mg Oral Q1200  . metoprolol tartrate  25 mg Oral BID   Continuous Infusions:    Assessment/plan:  Principal Problem:   Uncontrolled type 2 DM with hyperosmolar nonketotic hyperglycemia Active Problems:   Hypertension   Atrial fibrillation   Pulmonary fibrosis   Dehydration   Thrombocytopenia   Hyponatremia   Acute renal failure   Chronic diastolic heart failure   UTI (lower urinary tract infection)   1. Uncontrolled type 2 diabetes mellitus with hyperosmolar nonketotic  hyperglycemia.  Status post 3 L of normal saline in the ED and status post insulin drip. His blood glucose is much better. He is now been transitioned to sliding scale NovoLog and metformin. Blood sugars have been fluctuating. Hemoglobin A1C is 14.9. Of note, the patient has a history of diabetes, but was treated with diet alone. He was recently treated with a prednisone taper which had likely exacerbated his  diabetes. Venous glucose on admission was 954. Will add levemir insulin to assist with blood sugar control  Hyperkalemia.  This was likely the consequence of hyperglycemia in the setting of acute renal failure and ACE inhibitor therapy. Status post IV fluids, calcium gluconate, insulin, and sodium bicarbonate. His serum potassium has normalized, but still is on the high normal side. We'll continue to hold ACE inhibitor.   Acute renal failure, likely secondary to prerenal azotemia in the setting of hyperosmolar hyperglycemia.  Status post 3 L of normal saline in the ED. His renal function has normalized. Will discontinue IV fluids and encourage PO fluids.   Hyponatremia secondary to hyperglycemia and volume depletion/hypovolemia. Mostly resolved following normal saline infusion. We'll continue gentle normal saline.    Hypotension, secondary to hypovolemia (history of hypertension)  His blood pressure has improved, status post vigorous IV fluids. We'll continue to hold Zestril, but metoprolol  was restarted at a lower dose for treatment of atrial fibrillation.    Chronic diastolic heart failure.  His 2-D echocardiogram in January 2015 revealed grade 2 diastolic dysfunction and an ejection fraction of 65%. We'll continue to hydrate gently. no signs of acute diastolic heart failure.   Chronic atrial fibrillation.  He is treated chronically with metoprolol for rate control and is anticoagulated with Apixaban. He is also treated with aspirin. We'll hold aspirin for now and continue  Apixaban.    Thrombocytopenia. This may be secondary to aspirin and Apixaban. We'll continue anti-coagulation and monitor his platelet count closely. Aspirin has been discontinued. His vitamin B12 level is greater than 2000. TSH is normal range.  Pulmonary fibrosis/chronic hypoxic respiratory failure.  He was recently treated for an exacerbation with a prednisone taper per his pulmonologist, Dr. Juanetta GoslingHawkins. His pulmonary status appears to be at baseline. We'll add when necessary Xopenex.   Urinary tract infection. Will continue Rocephin.  Urine culture shows gram negative rods with final report pending.    Time spent: 35 minutes.     [facility]allenMEMON,JEHANZEB  [facility] Pager [phone]. If 7PM-7AM, please contact night-coverage at [email or web address], password [email or web address] 05/02/2014, 9:10 AM  LOS: 2 days

## 2014-05-03 DIAGNOSIS — E860 Dehydration: Secondary | ICD-10-CM

## 2014-05-03 LAB — BASIC METABOLIC PANEL
Anion gap: 12 (ref 5–15)
BUN: 16 mg/dL (ref 6–23)
CHLORIDE: 97 meq/L (ref 96–112)
CO2: 24 meq/L (ref 19–32)
CREATININE: 1.11 mg/dL (ref 0.50–1.35)
Calcium: 8.9 mg/dL (ref 8.4–10.5)
GFR calc Af Amer: 73 mL/min — ABNORMAL LOW (ref >90)
GFR calc non Af Amer: 63 mL/min — ABNORMAL LOW (ref >90)
GLUCOSE: 93 mg/dL (ref 70–99)
POTASSIUM: 4.9 meq/L (ref 3.7–5.3)
Sodium: 133 mEq/L — ABNORMAL LOW (ref 137–147)

## 2014-05-03 LAB — GLUCOSE, CAPILLARY
GLUCOSE-CAPILLARY: 280 mg/dL — AB (ref 70–99)
GLUCOSE-CAPILLARY: 281 mg/dL — AB (ref 70–99)

## 2014-05-03 LAB — URINE CULTURE: Colony Count: 45000

## 2014-05-03 MED ORDER — METFORMIN HCL 500 MG PO TABS: 500.0000 mg | ORAL_TABLET | Freq: Two times a day (BID) | ORAL | Status: AC

## 2014-05-03 MED ORDER — LIVING WELL WITH DIABETES BOOK
Freq: Once | Status: AC
  Administered 2014-05-03: 16:00:00
  Filled 2014-05-03: qty 1

## 2014-05-03 MED ORDER — INSULIN DETEMIR 100 UNIT/ML ~~LOC~~ SOLN: 20.0000 [IU] | Freq: Every day | SUBCUTANEOUS | Status: AC

## 2014-05-03 MED ORDER — CIPROFLOXACIN HCL 500 MG PO TABS: 500.0000 mg | ORAL_TABLET | Freq: Two times a day (BID) | ORAL | Status: DC

## 2014-05-03 NOTE — Discharge Instructions (Signed)
Hyperglycemic Hyperosmolar State Hyperglycemic hyperosmolar state is a serious condition in which you experience an extreme increase in your blood sugar (glucose) level. This makes your body become extremely dehydrated, which can be life threatening.  This condition usually happens to people with type 2 diabetes mellitus. It may occur over a period of days or even weeks. It may occur in those who have not been diagnosed with diabetes mellitus or in those people who have not been able to control their diabetes mellitus for various reasons. Normally your kidneys try to get rid of extra glucose through urine. However, if you do not drink enough fluids or if you drink fluids that contain sugar, your kidneys will no longer be able to get rid of the extra glucose and your blood glucose levels will increase. CAUSES  This condition may be brought on by:  Infection.  Use of medicines that cause you to become dehydrated or cause you to lose fluid.  Other illnesses.  Not taking your diabetes medication. RISK FACTORS  Older age.  Poor management of diabetes mellitus.  Inability to eat or drink normally.  Heart failure.  Infection.  Surgery.  Illness. SYMPTOMS   Extreme or increased thirst (although this may gradually disappear).  Increased urination.  Dry, parched mouth.  Warm, dry skin that does not sweat.  High fever.  Sleepiness or confusion.  Vision problems or vision loss.  Seeing or hearing things that are not there (hallucinations).  Weakness on one side of the body. DIAGNOSIS  Hyperglycemic hyperosmolar state is diagnosed with a medical history and evaluation of laboratory test results.  TREATMENT  The goal of treatment is to correct the dehydration. Fluids are replaced through an IV tube.  HOME CARE INSTRUCTIONS  Check your blood glucose level regularly.  Talk with your health care provider about when to check your blood glucose level and what the numbers  mean.  Check your blood glucose level more often when you are sick. Ask your health care provider about your sick day plan.  Always stay well hydrated especially when you are ill, exercising, or you are out in the heat. SEEK MEDICAL CARE:  If you are ill and cannot eat or drink.  If you develop fever. SEEK IMMEDIATE MEDICAL CARE: If you develop any of the symptoms listed above of hyperglycemic hyperosmolar state. MAKE SURE YOU:  Understand these instructions.   Will watch your condition.   Will get help right away if you are not doing well or get worse. Document Released: 08/03/2004 Document Revised: 06/03/2013 Document Reviewed: 03/10/2013 [facility] Patient Information 2015 [employer], [place]. This information is not intended to replace advice given to you by your health care provider. Make sure you discuss any questions you have with your health care provider.

## 2014-05-03 NOTE — Progress Notes (Signed)
Patient discharged to home, in own care. Discharge instructions given regarding follow-up appointment, reason to call 911, and diabetes management. Patient appears overwhelmed by amount of information regarding diabetes. Patient was able to "teach back' when to check blood glucose, and how to record. Patient demonstrated aseptic technique when administering own insulin. Home Health nurse will continue teaching. Patient [facility]

## 2014-05-03 NOTE — Discharge Summary (Signed)
Physician Discharge Summary  [name] Sherman WUJ:[id number] DOB: [birth year] DOA: 04/30/2014  PCP: [name], PA-C  Admit date: 04/30/2014 Discharge date: 05/03/2014  Time spent: 40 minutes  Recommendations for Outpatient Follow-up:  1. Patient is set up a home health RN 2. Followup primary care physician one week for further adjustment of diabetes medications  Discharge Diagnoses:  Principal Problem:   Uncontrolled type 2 DM with hyperosmolar nonketotic hyperglycemia Active Problems:   Hypertension   Atrial fibrillation   Pulmonary fibrosis   Dehydration   Thrombocytopenia   Hyponatremia   Acute renal failure   Chronic diastolic heart failure   UTI (lower urinary tract infection)   Discharge Condition: improved  Diet recommendation: low salt, low carb  Filed Weights   05/01/14 0537 05/02/14 0500 05/03/14 0500  Weight: 73.6 kg (162 lb 4.1 oz) 76.9 kg (169 lb 8.5 oz) 75.8 kg (167 lb 1.7 oz)    History of present illness and hospital course:  This patient was admitted with cough and generalized weakness. He was recently started on chronic prednisone therapy for pulmonary fibrosis. He has a history of diabetes which is managed by diet alone. Unfortunately, patient developed hyperglycemic hyperosmolar nonketotic state. He was admitted to the step down unit and aggressively hydrated with IV fluids. He did receive an insulin infusion. Blood sugars subsequently improved and his dehydration resolved. He was transitioned to subcutaneous insulin. Hemoglobin A1c was checked and was found to be greater than 14. He's been started on basal insulin as well as metformin. He is reluctant to start meal coverage NovoLog. We have requested that he followup with his primary care physician for further adjustment of diabetes medications as well as possible addition of NovoLog. At this time, his blood sugars are reasonably controlled. He does not have any other complaints. He is ready for discharge  home  Procedures:    Consultations:  Diabetes coordinator  Discharge Exam: Filed Vitals:   05/03/14 1200  BP:   Pulse:   Temp: 97.6 F (36.4 C)  Resp:     General: NAD Cardiovascular: S1, S2 RRR Respiratory: CTAB  Discharge Instructions You were cared for by a hospitalist during your hospital stay. If you have any questions about your discharge medications or the care you received while you were in the hospital after you are discharged, you can call the unit and asked to speak with the hospitalist on call if the hospitalist that took care of you is not available. Once you are discharged, your primary care physician will handle any further medical issues. Please note that NO REFILLS for any discharge medications will be authorized once you are discharged, as it is imperative that you return to your primary care physician (or establish a relationship with a primary care physician if you do not have one) for your aftercare needs so that they can reassess your need for medications and monitor your lab values.  Discharge Instructions   Call MD for:  extreme fatigue    Complete by:  As directed      Call MD for:  persistant dizziness or light-headedness    Complete by:  As directed      Call MD for:  temperature >100.4    Complete by:  As directed      Diet - low sodium heart healthy    Complete by:  As directed      Face-to-face encounter (required for Medicare/Medicaid patients)    Complete by:  As directed   I MEMON,JEHANZEB  certify that this patient is under my care and that I, or a nurse practitioner or physician's assistant working with me, had a face-to-face encounter that meets the physician face-to-face encounter requirements with this patient on 05/03/2014. The encounter with the patient was in whole, or in part for the following medical condition(s) which is the primary reason for home health care (List medical condition): diabetic, new insulin, needs home health RN  The  encounter with the patient was in whole, or in part, for the following medical condition, which is the primary reason for home health care:  new diabetic on insulin  I certify that, based on my findings, the following services are medically necessary home health services:  Nursing  My clinical findings support the need for the above services:  Shortness of breath with activity  Further, I certify that my clinical findings support that this patient is homebound due to:  Shortness of Breath with activity  Reason for Medically Necessary Home Health Services:  Skilled Nursing- Administration and Training of Injectable Medication     Home Health    Complete by:  As directed   To provide the following care/treatments:  RN     Increase activity slowly    Complete by:  As directed             Medication List    STOP taking these medications       lisinopril 20 MG tablet  Commonly known as:  PRINIVIL,ZESTRIL      TAKE these medications       apixaban 5 MG Tabs tablet  Commonly known as:  ELIQUIS  Take 5 mg by mouth 2 (two) times daily.     aspirin EC 81 MG tablet  Take 81 mg by mouth daily.     atorvastatin 20 MG tablet  Commonly known as:  LIPITOR  Take 10 mg by mouth at bedtime.     Bee Pollen 550 MG Caps  Take 1 capsule by mouth at bedtime.     ciprofloxacin 500 MG tablet  Commonly known as:  CIPRO  Take 1 tablet (500 mg total) by mouth 2 (two) times daily.     insulin detemir 100 UNIT/ML injection  Commonly known as:  LEVEMIR  Inject 0.2 mLs (20 Units total) into the skin daily.     latanoprost 0.005 % ophthalmic solution  Commonly known as:  XALATAN  Place 1 drop into the left eye at bedtime.     metFORMIN 500 MG tablet  Commonly known as:  GLUCOPHAGE  Take 1 tablet (500 mg total) by mouth 2 (two) times daily with a meal.     metoprolol 50 MG tablet  Commonly known as:  LOPRESSOR  Take 25 mg by mouth 2 (two) times daily. Takes with Lisinopril     multivitamin  with minerals Tabs tablet  Take 1 tablet by mouth daily.     tadalafil 20 MG tablet  Commonly known as:  CIALIS  Take 20 mg by mouth daily as needed for erectile dysfunction.       No Known Allergies     Follow-up Information   Please follow up.   Contact information:   [facility] [phone]       The results of significant diagnostics from this hospitalization (including imaging, microbiology, ancillary and laboratory) are listed below for reference.    Significant Diagnostic Studies: Dg Chest 2 View  04/30/2014   CLINICAL DATA:  Weakness and dry mouth for  1 week  EXAM: CHEST  2 VIEW  COMPARISON:  Prior chest x-ray 04/01/2014 ; chest CT 12/11/2013  FINDINGS: Stable cardiac and mediastinal contours. Atherosclerotic calcifications present in the transverse aorta. Peripheral and basilar predominant fine reticular interstitial opacities are similar compared to prior and consistent with underlying interstitial fibrosis. Relative hyperlucency in the upper lungs is also similar compared to prior and consistent with underlying centrilobular emphysema. No focal airspace consolidation, pneumothorax or other acute abnormality. No acute osseous abnormality.  IMPRESSION: Stable chest x-ray without evidence of acute cardiopulmonary process.  Background changes of emphysema and pulmonary fibrosis.   Electronically Signed   By: Malachy Moan M.D.   On: 04/30/2014 11:12   Dg Chest Portable 1 View  04/30/2014   CLINICAL DATA:  Fatigue.  EXAM: PORTABLE CHEST - 1 VIEW  COMPARISON:  PA and lateral chest 04/30/2014 and 04/01/2014. CT shows 12/11/2013.  FINDINGS: Changes of pulmonary fibrosis are again seen. Lung volumes are lower than on comparison studies with crowding of the bronchovascular structures. No consolidative process is identified. Heart size is normal. No pneumothorax or pleural effusion.  IMPRESSION: Interstitial lung disease.  No acute finding in a low volume chest.    Electronically Signed   By: Drusilla Kanner M.D.   On: 04/30/2014 13:54    Microbiology: Recent Results (from the past 240 hour(s))  URINE CULTURE     Status: None   Collection Time    04/30/14  4:08 PM      Result Value Ref Range Status   Specimen Description Urine   Final   Special Requests NONE   Final   Culture  Setup Time     Final   Value: 04/30/2014 23:07     Performed at [facility]   Colony Count     Final   Value: 45,000 COLONIES/ML     Performed at [facility]   Culture     Final   Value: PROTEUS MIRABILIS     Performed at [facility]   Report Status 05/03/2014 FINAL   Final   Organism ID, Bacteria PROTEUS MIRABILIS   Final  MRSA PCR SCREENING     Status: None   Collection Time    04/30/14  4:49 PM      Result Value Ref Range Status   MRSA by PCR NEGATIVE  NEGATIVE Final   Comment:            The GeneXpert MRSA Assay (FDA     approved for NASAL specimens     only), is one component of a     comprehensive MRSA colonization     surveillance program. It is not     intended to diagnose MRSA     infection nor to guide or     monitor treatment for     MRSA infections.     Labs: Basic Metabolic Panel:  Recent Labs Lab 04/30/14 1115 04/30/14 1206 04/30/14 1747 05/01/14 0518 05/02/14 0528 05/03/14 0534  NA 123*  --  139 135* 132* 133*  K 6.2* 6.3* 4.7 5.3 5.3 4.9  CL 84*  --  101 100 100 97  CO2 25  --  23 24 19 24   GLUCOSE 954*  --  153* 155* 157* 93  BUN 47*  --  40* 30* 17 16  CREATININE 2.06*  --  1.71* 1.31 1.07 1.11  CALCIUM 9.5  --  9.5 8.2* 8.1* 8.9   Liver Function Tests:  Recent Labs  Lab 04/30/14 1115  AST 27  ALT 45  ALKPHOS 81  BILITOT 0.5  PROT 7.1  ALBUMIN 3.4*   No results found for this basename: LIPASE, AMYLASE,  in the last 168 hours No results found for this basename: AMMONIA,  in the last 168 hours CBC:  Recent Labs Lab 04/30/14 1115 05/01/14 0518  WBC 6.7 6.8  NEUTROABS 5.4  --   HGB  18.0* 15.4  HCT 51.5 43.6  MCV 95.0 92.2  PLT 111* 125*   Cardiac Enzymes:  Recent Labs Lab 04/30/14 1115 04/30/14 1939  TROPONINI <0.30 <0.30   BNP: BNP (last 3 results)  Recent Labs  04/01/14 1350 04/02/14 0525 04/30/14 1115  PROBNP 1414.0* 1543.0* 1929.0*   CBG:  Recent Labs Lab 05/02/14 1120 05/02/14 1618 05/02/14 2123 05/03/14 1159 05/03/14 1605  GLUCAP 294* 402* 152* 280* 281*       Signed:  MEMON,JEHANZEB  [facility] 05/03/2014, 8:48 PM

## 2014-05-03 NOTE — Progress Notes (Signed)
Spoke with patient about diabetes. Patient reports that he has a history of diabetes but does not take any outpatient home medications.   Patient reports that his cardiologist helps him keep a watch on his diabetes control. Inquired about knowledge about A1C and patient reports that he does not know what an A1C is. Discussed A1C results (14.9% on 05/01/14) and explained what an A1C is, basic pathophysiology of DM Type 2, basic home care, importance of checking CBGs and maintaining good CBG control to prevent long-term and short-term complications. Discussed impact of nutrition, exercise, stress, sickness, and medications on diabetes control.  Patient states that he does not currently follow a diabetic diet but he is willing to make changes in his diet to improve diabetes.  Discussed carbohydrates, carbohydrate goals per day and meal, along with portion sizes. Patient states that he is not followed by an endocrinologist and he would like to be referred to a local endocrinologist (Dr. Dorris Fetch) at time of discharge so he can get help with improving diabetes control.   Educated patient on insulin pen use at home. Reviewed contents of insulin flexpen starter kit. Reviewed all steps of insulin pen including attachment of needle, 2-unit air shot, dialing up dose, giving injection, removing needle, disposal of sharps, storage of unused insulin, disposal of insulin etc. Patient able to provide successful return demonstration. Also educated patient on insulin injections via vial and syringe. Patient was able to successfully demonstrate injection via vial and syringe. Discussed Levemir and Novolog insulin at length and explained onset, duration, and timing of Levemir and Novolog.  The use of daily insulin injections, need for home glucose monitoring regularly, (especially initially and when changing dose increasing checks to tid-qid), and symptoms of potential hypoglycemia are discussed. Patient states that he prefers to use  vial and syringe to do insulin injections.  Patient verbalized understanding of information discussed and he states that he has no further questions at this time related to diabetes.   After talking with the patient, patient states that he feels very over whelmed by taking insulin and would like to use only oral agents for diabetes control. Explained to the patient that he will need to be on insulin at this time and as he follows up with Dr. Dorris Fetch, the doctor may make changes in the regimen.  Asked patient to check blood glucose 4 times a day and be sure to take his meter with him to his follow up appointment.   May want to initially use a simple regimen (have patient use Levemir once a day and perhaps Metformin 500 mg BID) which can be adjusted by endocrinologist at time of follow up. MD to give patient Rxs for insulin vial, insulin syringes, glucometer, lancets, test strips. Also, please refer patient to follow up with Dr. Dorris Fetch the end of this week or early next week.  Thanks, Barnie Alderman, RN, MSN, CCRN Diabetes Coordinator Inpatient Diabetes Program [phone] (Team Pager) [phone] ([facility] office) [phone] [facility] office)

## 2014-05-03 NOTE — Care Management Note (Signed)
    Page 1 of 1   05/03/2014     3:54:37 PM CARE MANAGEMENT NOTE 05/03/2014  Patient:  Hartford PoliGWYNN,Rafi   Account Number:  [id number]  Date Initiated:  05/03/2014  Documentation initiated by:  Sharrie RothmanBLACKWELL,Liviah Cake C  Subjective/Objective Assessment:   Pt admitted from home with hyperglycemia. Pt lives alone and will return home at discharge. Pts wife is in nursing home and pt drives to see her. Pt is independent with ADl's.     Action/Plan:   [facility] Pt will need reinforcement of DM teaching and insulin administration. HH services to start within 48 hours of discharge. Pt and pts nurse aware of d/c arrangements.   Anticipated DC Date:  05/03/2014   Anticipated DC Plan:  HOME W HOME HEALTH SERVICES      DC Planning Services  CM consult      [facility]   Choice offered to / List presented to:  C-1 Patient        HH arranged  HH-1 RN      [facility]  [facility]   Status of service:  Completed, signed off Medicare Important Message given?  YES (If response is "NO", the following Medicare IM given date fields will be blank) Date Medicare IM given:  05/03/2014 Medicare IM given by:  Sharrie RothmanBLACKWELL,Wynne Jury C Date Additional Medicare IM given:   Additional Medicare IM given by:    Discharge Disposition:  HOME W HOME HEALTH SERVICES  Per UR Regulation:    If discussed at Long Length of Stay Meetings, dates discussed:    Comments:  05/03/14 1555 Arlyss Queenammy Kashena Novitski, RN BSN CM

## 2014-05-04 NOTE — Progress Notes (Signed)
Home medications given to son Artimus Kloeppel.

## 2014-05-07 ENCOUNTER — Encounter (HOSPITAL_COMMUNITY): Payer: Self-pay | Admitting: Emergency Medicine

## 2014-05-07 ENCOUNTER — Emergency Department (HOSPITAL_COMMUNITY): Payer: Medicare Other

## 2014-05-07 ENCOUNTER — Inpatient Hospital Stay (HOSPITAL_COMMUNITY)
Admission: EM | Admit: 2014-05-07 | Discharge: 2014-05-10 | DRG: 683 | Disposition: A | Discharge status: Home Health | Payer: Medicare Other | Attending: Family Medicine | Admitting: Family Medicine

## 2014-05-07 DIAGNOSIS — I10 Essential (primary) hypertension: Secondary | ICD-10-CM | POA: Diagnosis present

## 2014-05-07 DIAGNOSIS — I5032 Chronic diastolic (congestive) heart failure: Secondary | ICD-10-CM | POA: Diagnosis present

## 2014-05-07 DIAGNOSIS — N179 Acute kidney failure, unspecified: Principal | ICD-10-CM | POA: Diagnosis present

## 2014-05-07 DIAGNOSIS — D696 Thrombocytopenia, unspecified: Secondary | ICD-10-CM | POA: Diagnosis present

## 2014-05-07 DIAGNOSIS — Z9981 Dependence on supplemental oxygen: Secondary | ICD-10-CM

## 2014-05-07 DIAGNOSIS — E782 Mixed hyperlipidemia: Secondary | ICD-10-CM | POA: Diagnosis present

## 2014-05-07 DIAGNOSIS — E860 Dehydration: Secondary | ICD-10-CM | POA: Diagnosis present

## 2014-05-07 DIAGNOSIS — Z7982 Long term (current) use of aspirin: Secondary | ICD-10-CM | POA: Diagnosis not present

## 2014-05-07 DIAGNOSIS — R5383 Other fatigue: Secondary | ICD-10-CM

## 2014-05-07 DIAGNOSIS — IMO0001 Reserved for inherently not codable concepts without codable children: Secondary | ICD-10-CM | POA: Diagnosis present

## 2014-05-07 DIAGNOSIS — I959 Hypotension, unspecified: Secondary | ICD-10-CM | POA: Diagnosis present

## 2014-05-07 DIAGNOSIS — R5381 Other malaise: Secondary | ICD-10-CM

## 2014-05-07 DIAGNOSIS — Z87440 Personal history of urinary (tract) infections: Secondary | ICD-10-CM | POA: Diagnosis not present

## 2014-05-07 DIAGNOSIS — E1165 Type 2 diabetes mellitus with hyperglycemia: Secondary | ICD-10-CM

## 2014-05-07 DIAGNOSIS — J9610 Chronic respiratory failure, unspecified whether with hypoxia or hypercapnia: Secondary | ICD-10-CM | POA: Diagnosis present

## 2014-05-07 DIAGNOSIS — B3749 Other urogenital candidiasis: Secondary | ICD-10-CM | POA: Diagnosis present

## 2014-05-07 DIAGNOSIS — I4891 Unspecified atrial fibrillation: Secondary | ICD-10-CM | POA: Diagnosis present

## 2014-05-07 DIAGNOSIS — Z794 Long term (current) use of insulin: Secondary | ICD-10-CM

## 2014-05-07 DIAGNOSIS — E872 Acidosis, unspecified: Secondary | ICD-10-CM | POA: Diagnosis present

## 2014-05-07 DIAGNOSIS — J449 Chronic obstructive pulmonary disease, unspecified: Secondary | ICD-10-CM | POA: Diagnosis present

## 2014-05-07 DIAGNOSIS — T46905A Adverse effect of unspecified agents primarily affecting the cardiovascular system, initial encounter: Secondary | ICD-10-CM | POA: Diagnosis present

## 2014-05-07 DIAGNOSIS — J8410 Pulmonary fibrosis, unspecified: Secondary | ICD-10-CM | POA: Diagnosis present

## 2014-05-07 DIAGNOSIS — N390 Urinary tract infection, site not specified: Secondary | ICD-10-CM | POA: Diagnosis present

## 2014-05-07 DIAGNOSIS — Z833 Family history of diabetes mellitus: Secondary | ICD-10-CM | POA: Diagnosis not present

## 2014-05-07 DIAGNOSIS — E119 Type 2 diabetes mellitus without complications: Secondary | ICD-10-CM

## 2014-05-07 DIAGNOSIS — R531 Weakness: Secondary | ICD-10-CM

## 2014-05-07 DIAGNOSIS — N289 Disorder of kidney and ureter, unspecified: Secondary | ICD-10-CM

## 2014-05-07 DIAGNOSIS — J4489 Other specified chronic obstructive pulmonary disease: Secondary | ICD-10-CM | POA: Diagnosis present

## 2014-05-07 HISTORY — DX: Dependence on supplemental oxygen: Z99.81

## 2014-05-07 HISTORY — DX: Chronic obstructive pulmonary disease, unspecified: J44.9

## 2014-05-07 LAB — CBC WITH DIFFERENTIAL/PLATELET
BASOS ABS: 0 10*3/uL (ref 0.0–0.1)
Basophils Relative: 0 % (ref 0–1)
Eosinophils Absolute: 0.1 10*3/uL (ref 0.0–0.7)
Eosinophils Relative: 1 % (ref 0–5)
HCT: 39.5 % (ref 39.0–52.0)
Hemoglobin: 13.9 g/dL (ref 13.0–17.0)
LYMPHS PCT: 13 % (ref 12–46)
Lymphs Abs: 0.9 10*3/uL (ref 0.7–4.0)
MCH: 33.4 pg (ref 26.0–34.0)
MCHC: 35.2 g/dL (ref 30.0–36.0)
MCV: 95 fL (ref 78.0–100.0)
Monocytes Absolute: 0.6 10*3/uL (ref 0.1–1.0)
Monocytes Relative: 8 % (ref 3–12)
NEUTROS ABS: 5.5 10*3/uL (ref 1.7–7.7)
NEUTROS PCT: 78 % — AB (ref 43–77)
PLATELETS: 177 10*3/uL (ref 150–400)
RBC: 4.16 MIL/uL — ABNORMAL LOW (ref 4.22–5.81)
RDW: 13.3 % (ref 11.5–15.5)
WBC: 7.1 10*3/uL (ref 4.0–10.5)

## 2014-05-07 LAB — URINALYSIS, ROUTINE W REFLEX MICROSCOPIC
BILIRUBIN URINE: NEGATIVE
Bilirubin Urine: NEGATIVE
GLUCOSE, UA: 250 mg/dL — AB
Glucose, UA: 500 mg/dL — AB
HGB URINE DIPSTICK: NEGATIVE
Hgb urine dipstick: NEGATIVE
KETONES UR: NEGATIVE mg/dL
Ketones, ur: NEGATIVE mg/dL
NITRITE: NEGATIVE
Nitrite: NEGATIVE
PH: 5 (ref 5.0–8.0)
PROTEIN: NEGATIVE mg/dL
PROTEIN: NEGATIVE mg/dL
SPECIFIC GRAVITY, URINE: 1.015 (ref 1.005–1.030)
Specific Gravity, Urine: 1.015 (ref 1.005–1.030)
UROBILINOGEN UA: 0.2 mg/dL (ref 0.0–1.0)
Urobilinogen, UA: 0.2 mg/dL (ref 0.0–1.0)
pH: 5 (ref 5.0–8.0)

## 2014-05-07 LAB — GLUCOSE, CAPILLARY: Glucose-Capillary: 167 mg/dL — ABNORMAL HIGH (ref 70–99)

## 2014-05-07 LAB — BASIC METABOLIC PANEL
ANION GAP: 14 (ref 5–15)
BUN: 37 mg/dL — ABNORMAL HIGH (ref 6–23)
CHLORIDE: 96 meq/L (ref 96–112)
CO2: 21 mEq/L (ref 19–32)
CREATININE: 1.78 mg/dL — AB (ref 0.50–1.35)
Calcium: 8.9 mg/dL (ref 8.4–10.5)
GFR calc non Af Amer: 36 mL/min — ABNORMAL LOW (ref >90)
GFR, EST AFRICAN AMERICAN: 41 mL/min — AB (ref >90)
Glucose, Bld: 106 mg/dL — ABNORMAL HIGH (ref 70–99)
Potassium: 4.9 mEq/L (ref 3.7–5.3)
SODIUM: 131 meq/L — AB (ref 137–147)

## 2014-05-07 LAB — URINE MICROSCOPIC-ADD ON

## 2014-05-07 LAB — CREATININE, URINE, RANDOM: Creatinine, Urine: 87.79 mg/dL

## 2014-05-07 LAB — CBG MONITORING, ED: Glucose-Capillary: 112 mg/dL — ABNORMAL HIGH (ref 70–99)

## 2014-05-07 LAB — SODIUM, URINE, RANDOM: SODIUM UR: 34 meq/L

## 2014-05-07 LAB — LACTIC ACID, PLASMA: Lactic Acid, Venous: 4.3 mmol/L — ABNORMAL HIGH (ref 0.5–2.2)

## 2014-05-07 LAB — TROPONIN I: Troponin I: <0.3 ng/mL (ref <0.30)

## 2014-05-07 MED ORDER — ONDANSETRON HCL 4 MG/2ML IJ SOLN: 4.0000 mg | Freq: Four times a day (QID) | INTRAMUSCULAR | Status: DC | PRN

## 2014-05-07 MED ORDER — INSULIN ASPART 100 UNIT/ML ~~LOC~~ SOLN
0.0000 [IU] | Freq: Three times a day (TID) | SUBCUTANEOUS | Status: DC
  Administered 2014-05-08: 3 [IU] via SUBCUTANEOUS
  Administered 2014-05-08: 2 [IU] via SUBCUTANEOUS
  Administered 2014-05-09 (×2): 5 [IU] via SUBCUTANEOUS
  Administered 2014-05-09 – 2014-05-10 (×2): 2 [IU] via SUBCUTANEOUS
  Administered 2014-05-10: 5 [IU] via SUBCUTANEOUS

## 2014-05-07 MED ORDER — ONDANSETRON HCL 4 MG PO TABS: 4.0000 mg | ORAL_TABLET | Freq: Four times a day (QID) | ORAL | Status: DC | PRN

## 2014-05-07 MED ORDER — ATORVASTATIN CALCIUM 20 MG PO TABS
20.0000 mg | ORAL_TABLET | Freq: Every day | ORAL | Status: DC
  Administered 2014-05-07 – 2014-05-09 (×3): 20 mg via ORAL
  Filled 2014-05-07 (×5): qty 1

## 2014-05-07 MED ORDER — SODIUM CHLORIDE 0.9 % IV BOLUS (SEPSIS)
500.0000 mL | Freq: Once | INTRAVENOUS | Status: AC
  Administered 2014-05-07: 500 mL via INTRAVENOUS

## 2014-05-07 MED ORDER — ASPIRIN EC 81 MG PO TBEC
81.0000 mg | DELAYED_RELEASE_TABLET | Freq: Every day | ORAL | Status: DC
Start: 2014-05-08 — End: 2014-05-10
  Administered 2014-05-08 – 2014-05-10 (×3): 81 mg via ORAL
  Filled 2014-05-07 (×5): qty 1

## 2014-05-07 MED ORDER — ACETAMINOPHEN 650 MG RE SUPP: 650.0000 mg | Freq: Four times a day (QID) | RECTAL | Status: DC | PRN

## 2014-05-07 MED ORDER — SODIUM CHLORIDE 0.9 % IV SOLN
INTRAVENOUS | Status: DC
Start: 2014-05-07 — End: 2014-05-08
  Administered 2014-05-07: 15:00:00 via INTRAVENOUS

## 2014-05-07 MED ORDER — INSULIN DETEMIR 100 UNIT/ML ~~LOC~~ SOLN
20.0000 [IU] | Freq: Every day | SUBCUTANEOUS | Status: DC
  Administered 2014-05-09 – 2014-05-10 (×2): 20 [IU] via SUBCUTANEOUS
  Filled 2014-05-07 (×4): qty 0.2

## 2014-05-07 MED ORDER — LEVOFLOXACIN IN D5W 750 MG/150ML IV SOLN
750.0000 mg | INTRAVENOUS | Status: DC
  Administered 2014-05-07: 750 mg via INTRAVENOUS
  Filled 2014-05-07: qty 150

## 2014-05-07 MED ORDER — ACETAMINOPHEN 325 MG PO TABS: 650.0000 mg | ORAL_TABLET | Freq: Four times a day (QID) | ORAL | Status: DC | PRN

## 2014-05-07 MED ORDER — APIXABAN 5 MG PO TABS
5.0000 mg | ORAL_TABLET | Freq: Two times a day (BID) | ORAL | Status: DC
  Administered 2014-05-07 – 2014-05-10 (×6): 5 mg via ORAL
  Filled 2014-05-07 (×10): qty 1

## 2014-05-07 MED ORDER — LATANOPROST 0.005 % OP SOLN
1.0000 [drp] | Freq: Every day | OPHTHALMIC | Status: DC
  Administered 2014-05-08 – 2014-05-09 (×2): 1 [drp] via OPHTHALMIC
  Filled 2014-05-07: qty 2.5

## 2014-05-07 NOTE — ED Provider Notes (Signed)
CSN: [id number]     Arrival date & time 05/07/14  1324 History   First MD Initiated Contact with Patient 05/07/14 1353     Chief Complaint  Patient presents with  . Hypotension      HPI Pt was seen at 1355. Per EMS and pt report, c/o gradual onset and persistence of constant generalized weakness/fatigue for the past 4 days. States his symptoms began after he was discharged from the hospital 4 days ago for new dx of DM and UTI.  Pt was at his [facility] for DM teaching where he was found to by hypotensive with "BP 78/48." EMS noted BP "80/50" while en route and received IV NS . Pt states he has been taking his BP at home with "the top number being in the 90's" for the past several days. Pt states he has been taking all his discharge medications as directed. Endorses he has 1 last dose of cipro to take for his UTI. Pt denies CP/palpitations, no SOB/cough, no abd pain, no N/V/D, no back pain, no focal motor weakness, no tingling/numbness in extremities, no fevers, no falls, no syncope.     Past Medical History  Diagnosis Date  . Essential hypertension, benign   . Type 2 diabetes mellitus   . Atrial fibrillation     Dr. [name] [facility]  . Mixed hyperlipidemia   . Pulmonary fibrosis     dx 11/2013  . Pneumonia     11/2013  . Chronic diastolic heart failure   . On home O2     2L N/C prn  . COPD (chronic obstructive pulmonary disease)    Past Surgical History  Procedure Laterality Date  . Hernia repair    . Eye surgery     Family History  Problem Relation Age of Onset  . Diabetes Mellitus II Mother   . Diabetes Mellitus II Sister    History  Substance Use Topics  . Smoking status: Never Smoker   . Smokeless tobacco: Not on file  . Alcohol Use: Yes     Comment: Occasional    Review of Systems ROS: Statement: All systems negative except as marked or noted in the HPI; Constitutional: Negative for fever and chills. +generalized weakness/fatigue.; ; Eyes:  Negative for eye pain, redness and discharge. ; ; ENMT: Negative for ear pain, hoarseness, nasal congestion, sinus pressure and sore throat. ; ; Cardiovascular: Negative for chest pain, palpitations, diaphoresis, dyspnea and peripheral edema. ; ; Respiratory: Negative for cough, wheezing and stridor. ; ; Gastrointestinal: Negative for nausea, vomiting, diarrhea, abdominal pain, blood in stool, hematemesis, jaundice and rectal bleeding. . ; ; Genitourinary: Negative for dysuria, flank pain and hematuria. ; ; Musculoskeletal: Negative for back pain and neck pain. Negative for swelling and trauma.; ; Skin: Negative for pruritus, rash, abrasions, blisters, bruising and skin lesion.; ; Neuro: Negative for headache, lightheadedness and neck stiffness. Negative for altered level of consciousness , altered mental status, extremity weakness, paresthesias, involuntary movement, seizure and syncope.      Allergies  Review of patient's allergies indicates no known allergies.  Home Medications   Prior to Admission medications   Medication Sig Start Date End Date Taking? Authorizing Provider  apixaban (ELIQUIS) 5 MG TABS tablet Take 5 mg by mouth 2 (two) times daily.   Yes Historical Provider, MD  aspirin EC 81 MG tablet Take 81 mg by mouth daily.   Yes Historical Provider, MD  atorvastatin (LIPITOR) 20 MG tablet Take 20 mg by  mouth at bedtime.    Yes Historical Provider, MD  Bee Pollen 550 MG CAPS Take 1 capsule by mouth at bedtime.   Yes Historical Provider, MD  ciprofloxacin (CIPRO) 500 MG tablet Take 1 tablet (500 mg total) by mouth 2 (two) times daily. 05/03/14  Yes Erick BlinksJehanzeb Memon, MD  insulin detemir (LEVEMIR) 100 UNIT/ML injection Inject 0.2 mLs (20 Units total) into the skin daily. 05/03/14  Yes Erick BlinksJehanzeb Memon, MD  latanoprost (XALATAN) 0.005 % ophthalmic solution Place 1 drop into the left eye at bedtime.   Yes Historical Provider, MD  lisinopril (PRINIVIL,ZESTRIL) 20 MG tablet Take 20 mg by mouth 2 (two)  times daily.   Yes Historical Provider, MD  metFORMIN (GLUCOPHAGE) 500 MG tablet Take 1 tablet (500 mg total) by mouth 2 (two) times daily with a meal. 05/03/14  Yes Erick BlinksJehanzeb Memon, MD  metoprolol (LOPRESSOR) 50 MG tablet Take 25 mg by mouth 2 (two) times daily. Takes with Lisinopril   Yes Historical Provider, MD  Multiple Vitamin (MULTIVITAMIN WITH MINERALS) TABS tablet Take 1 tablet by mouth daily.   Yes Historical Provider, MD  tadalafil (CIALIS) 20 MG tablet Take 20 mg by mouth daily as needed for erectile dysfunction.    Historical Provider, MD   BP 81/60  Pulse 71  Temp(Src) 97.9 F (36.6 C) (Oral)  Resp 20  Ht 5\' 11"  (1.803 m)  Wt 155 lb (70.308 kg)  BMI 21.63 kg/m2  SpO2 100% Filed Vitals:   05/07/14 1326 05/07/14 1339 05/07/14 1415  BP: 79/63 77/55 81/60   Pulse: 71  71  Temp: 97.9 F (36.6 C)    TempSrc: Oral    Resp: 22  20  Height: 5\' 11"  (1.803 m)    Weight: 155 lb (70.308 kg)    SpO2:  91% 100%    Physical Exam 1400: Physical examination:  Nursing notes reviewed; Vital signs and O2 SAT reviewed;  Constitutional: Well developed, Well nourished, In no acute distress; Head:  Normocephalic, atraumatic; Eyes: EOMI, PERRL, No scleral icterus; ENMT: Mouth and pharynx normal, Mucous membranes dry; Neck: Supple, Full range of motion, No lymphadenopathy; Cardiovascular: Irregular irregular rate and rhythm, No gallop; Respiratory: Breath sounds clear, diminished & equal bilaterally, No wheezes.  Speaking full sentences with ease, Normal respiratory effort/excursion; Chest: Nontender, Movement normal; Abdomen: Soft, Nontender, Nondistended, Normal bowel sounds; Genitourinary: No CVA tenderness; Extremities: Pulses normal, No tenderness, No edema, No calf edema or asymmetry.; Neuro: AA&Ox3, Major CN grossly intact.  Speech clear. No gross focal motor or sensory deficits in extremities.; Skin: Color normal, Warm, Dry.    ED Course  Procedures  1405:  While [employer] reviewed pt's  medications, it was noted that his lisinopril was supposed to be stopped, as per his hospital d/c instructions. Pt states he has been taking his lisinopril with his metoprolol "just like I always do" for the past 4 days. This my likely be the cause for his hypotension. Will continue judicious IVF boluses (hx CHF) and monitor while workup progresses.   1545:  +orthostatic on VS. Pt continues to mentate per his baseline. BUN/Cr elevated compared to inpt labs; IVF continued with BP increasing to 90/70. +UTI, UC pending. UC during last admission with proteus, sensitive to cipro. Dx and testing d/w pt and family.  Questions answered.  Verb understanding, agreeable to admit.  T/C to [facility] Dr. Sharl MaLama, case discussed, including:  HPI, pertinent PM/SHx, VS/PE, dx testing, ED course and treatment:  Agreeable to admit, requests to write temporary orders,  obtain stepdown bed to team 1.     EKG Interpretation None      MDM  MDM Reviewed: previous chart, nursing note and vitals Reviewed previous: labs and ECG Interpretation: labs, ECG and x-ray Total time providing critical care: 30-74 minutes. This excludes time spent performing separately reportable procedures and services.   CRITICAL CARE Performed by: Laray Anger Total critical care time: 40 Critical care time was exclusive of separately billable procedures and treating other patients. Critical care was necessary to treat or prevent imminent or life-threatening deterioration. Critical care was time spent personally by me on the following activities: development of treatment plan with patient and/or surrogate as well as nursing, discussions with consultants, evaluation of patient's response to treatment, examination of patient, obtaining history from patient or surrogate, ordering and performing treatments and interventions, ordering and review of laboratory studies, ordering and review of radiographic studies, pulse oximetry and re-evaluation of  patient's condition.    Date: 05/07/2014  Rate: 72  Rhythm: atrial fibrillation  QRS Axis: normal  Intervals: QT prolonged  ST/T Wave abnormalities: nonspecific ST/T changes anterior leads  Conduction Disutrbances:none  Narrative Interpretation:   Old EKG Reviewed: changes noted; TWA anterior leads is new compared to previous EKG dated 04/30/2014 which had inferior, anterior, and lateral TWA.    Dg Chest Portable 1 View 05/07/2014   CLINICAL DATA:  Hypotension; history of pneumonia  EXAM: PORTABLE CHEST - 1 VIEW  COMPARISON:  Portable chest x-ray of April 30, 2014  FINDINGS: The left hemidiaphragm is much higher today than on the previous study. There is gaseous distention of bowel deep to the hemidiaphragm. The pulmonary interstitial markings remain diffusely increased bilaterally. The cardiac silhouette is mildly enlarged. Portions of the left heart border are obscured. The pulmonary vascularity is not clearly engorged.  IMPRESSION: New elevation of the left hemidiaphragm is demonstrated. Stable pulmonary fibrotic changes bilaterally.   Electronically Signed   By: David  [place]   On: 05/07/2014 14:09   Results for orders placed during the hospital encounter of 05/07/14  CBC WITH DIFFERENTIAL      Result Value Ref Range   WBC 7.1  4.0 - 10.5 K/uL   RBC 4.16 (*) 4.22 - 5.81 MIL/uL   Hemoglobin 13.9  13.0 - 17.0 g/dL   HCT 21.3  08.6 - 57.8 %   MCV 95.0  78.0 - 100.0 fL   MCH 33.4  26.0 - 34.0 pg   MCHC 35.2  30.0 - 36.0 g/dL   RDW 46.9  62.9 - 52.8 %   Platelets 177  150 - 400 K/uL   Neutrophils Relative % 78 (*) 43 - 77 %   Neutro Abs 5.5  1.7 - 7.7 K/uL   Lymphocytes Relative 13  12 - 46 %   Lymphs Abs 0.9  0.7 - 4.0 K/uL   Monocytes Relative 8  3 - 12 %   Monocytes Absolute 0.6  0.1 - 1.0 K/uL   Eosinophils Relative 1  0 - 5 %   Eosinophils Absolute 0.1  0.0 - 0.7 K/uL   Basophils Relative 0  0 - 1 %   Basophils Absolute 0.0  0.0 - 0.1 K/uL  BASIC METABOLIC PANEL      Result  Value Ref Range   Sodium 131 (*) 137 - 147 mEq/L   Potassium 4.9  3.7 - 5.3 mEq/L   Chloride 96  96 - 112 mEq/L   CO2 21  19 - 32 mEq/L   Glucose, Bld  106 (*) 70 - 99 mg/dL   BUN 37 (*) 6 - 23 mg/dL   Creatinine, Ser 4.09 (*) 0.50 - 1.35 mg/dL   Calcium 8.9  8.4 - 81.1 mg/dL   GFR calc non Af Amer 36 (*) >90 mL/min   GFR calc Af Amer 41 (*) >90 mL/min   Anion gap 14  5 - 15  TROPONIN I      Result Value Ref Range   Troponin I <0.30  <0.30 ng/mL  URINALYSIS, ROUTINE W REFLEX MICROSCOPIC      Result Value Ref Range   Color, Urine YELLOW  YELLOW   APPearance CLEAR  CLEAR   Specific Gravity, Urine 1.015  1.005 - 1.030   pH 5.0  5.0 - 8.0   Glucose, UA 500 (*) NEGATIVE mg/dL   Hgb urine dipstick NEGATIVE  NEGATIVE   Bilirubin Urine NEGATIVE  NEGATIVE   Ketones, ur NEGATIVE  NEGATIVE mg/dL   Protein, ur NEGATIVE  NEGATIVE mg/dL   Urobilinogen, UA 0.2  0.0 - 1.0 mg/dL   Nitrite NEGATIVE  NEGATIVE   Leukocytes, UA SMALL (*) NEGATIVE  LACTIC ACID, PLASMA      Result Value Ref Range   Lactic Acid, Venous 4.3 (*) 0.5 - 2.2 mmol/L  URINE MICROSCOPIC-ADD ON      Result Value Ref Range   WBC, UA 11-20  <3 WBC/hpf   Bacteria, UA FEW (*) RARE   Casts HYALINE CASTS (*) NEGATIVE  CBG MONITORING, ED      Result Value Ref Range   Glucose-Capillary 112 (*) 70 - 99 mg/dL    Results for TYREL, LEX (MRN [id number]) as of 05/07/2014 15:10  Ref. Range 05/01/2014 05:18 05/02/2014 05:28 05/03/2014 05:34 05/07/2014 13:46  BUN Latest Range: 6-23 mg/dL 30 (H) 17 16 37 (H)  Creatinine Latest Range: 0.50-1.35 mg/dL 2.13 0.86 5.78 4.69 (H)      Laray Anger, DO 05/07/14 2141

## 2014-05-07 NOTE — Progress Notes (Signed)
ANTIBIOTIC CONSULT NOTE - INITIAL  Pharmacy Consult for Levaquin Indication: UTI  No Known Allergies  Patient Measurements: Height: 5\' 11"  (180.3 cm) Weight: 155 lb (70.308 kg) IBW/kg (Calculated) : 75.3  Vital Signs: Temp: 97.9 F (36.6 C) (07/24 1326) Temp src: Oral (07/24 1326) BP: 80/59 mmHg (07/24 1645) Pulse Rate: 77 (07/24 1645) Intake/Output from previous day:   Intake/Output from this shift:    Labs:  Recent Labs  05/07/14 1346  WBC 7.1  HGB 13.9  PLT 177  CREATININE 1.78*   Estimated Creatinine Clearance: 35.7 ml/min (by C-G formula based on Cr of 1.78). No results found for this basename: Rolm GalaVANCOTROUGH, VANCOPEAK, VANCORANDOM, GENTTROUGH, GENTPEAK, GENTRANDOM, TOBRATROUGH, TOBRAPEAK, TOBRARND, AMIKACINPEAK, AMIKACINTROU, AMIKACIN,  in the last 72 hours   Microbiology: Recent Results (from the past 720 hour(s))  URINE CULTURE     Status: None   Collection Time    04/30/14  4:08 PM      Result Value Ref Range Status   Specimen Description Urine   Final   Special Requests NONE   Final   Culture  Setup Time     Final   Value: 04/30/2014 23:07     Performed at [facility]   Colony Count     Final   Value: 45,000 COLONIES/ML     Performed at [facility]   Culture     Final   Value: PROTEUS MIRABILIS     Performed at [facility]   Report Status 05/03/2014 FINAL   Final   Organism ID, Bacteria PROTEUS MIRABILIS   Final  MRSA PCR SCREENING     Status: None   Collection Time    04/30/14  4:49 PM      Result Value Ref Range Status   MRSA by PCR NEGATIVE  NEGATIVE Final   Comment:            The GeneXpert MRSA Assay (FDA     approved for NASAL specimens     only), is one component of a     comprehensive MRSA colonization     surveillance program. It is not     intended to diagnose MRSA     infection nor to guide or     monitor treatment for     MRSA infections.    Medical History: Past Medical History  Diagnosis Date   . Essential hypertension, benign   . Type 2 diabetes mellitus   . Atrial [facility]     [facility]  . Mixed hyperlipidemia   . Pulmonary fibrosis     dx 11/2013  . Pneumonia     11/2013  . Chronic diastolic heart failure   . On home O2     2L N/C prn  . COPD (chronic obstructive pulmonary disease)    Levaquin 7/24 >>  Assessment: 73yo male with elevated SCr.  Estimated Creatinine Clearance: 35.7 ml/min (by C-G formula based on Cr of 1.78). Asked to initiate Levaquin for UTI.  Goal of Therapy:  Eradicate infection.  Plan:  Levaquin 750mg  IV q48hrs Switch to PO when improved / appropriate Monitor labs, progress, renal fxn, and cultures  Valrie HartHall, Aliahna Statzer A 05/07/2014,6:18 PM

## 2014-05-07 NOTE — Plan of Care (Signed)
Problem: Hemodynamic Status Goal: Hemodynamically stable Outcome: Progressing Blood pressure remains low, patient asymptomatic

## 2014-05-07 NOTE — Plan of Care (Signed)
Problem: Consults Goal: Diagnosis-Diabetes Mellitus Outcome: Progressing New Onset Type II - Diagnosed on his last admission

## 2014-05-07 NOTE — H&P (Signed)
PCP:   [name], PA-C   Chief Complaint:  Weakness  HPI: 73 year old male who   has a past medical history of Essential hypertension, benign; Type 2 diabetes mellitus; Atrial fibrillation; Mixed hyperlipidemia; Pulmonary fibrosis; Pneumonia; Chronic diastolic heart failure; On home O2; and COPD (chronic obstructive pulmonary disease). Patient was recently discharged from the hospital on 20th July after he was treated for UTI, hyperosmolar nonketotic hyperglycemia. As per patient since he went home he has been feeling weak, and fatigued. [facility] and he was found to be hypotensive with blood pressure 78/48 on EMS arrival. In route the blood pressure was 80/50 and he received 500 normal saline bolus. Patient also was discharged on Cipro for UTI and has been taking as prescribed. Patient was advised not to take lisinopril 20 mg twice a day, due to hypotension but patient was still taking the medication along with metoprolol 25 mg twice a day. He denies fever, denies dysuria urgency or frequency of urination. Patient does have a history of chronic hypoxic respiratory failure due to pulmonary fibrosis and chronic diastolic heart failure and is on oxygen. Patient has noticed that he has been more short of breath than usual. The denies coughing up any phlegm. In the ED patient was found to have abnormal urine, hypertensive with systolic blood pressure in 70s. Patient is mentally alert and oriented, lactate is 4.3.  Allergies:  No Known Allergies    Past Medical History  Diagnosis Date  . Essential hypertension, benign   . Type 2 diabetes mellitus   . Atrial fibrillation     Dr. [name] [facility]  . Mixed hyperlipidemia   . Pulmonary fibrosis     dx 11/2013  . Pneumonia     11/2013  . Chronic diastolic heart failure   . On home O2     2L N/C prn  . COPD (chronic obstructive pulmonary disease)     Past Surgical History    Procedure Laterality Date  . Hernia repair    . Eye surgery      Prior to Admission medications   Medication Sig Start Date End Date Taking? Authorizing Provider  apixaban (ELIQUIS) 5 MG TABS tablet Take 5 mg by mouth 2 (two) times daily.   Yes Historical Provider, MD  aspirin EC 81 MG tablet Take 81 mg by mouth daily.   Yes Historical Provider, MD  atorvastatin (LIPITOR) 20 MG tablet Take 20 mg by mouth at bedtime.    Yes Historical Provider, MD  Bee Pollen 550 MG CAPS Take 1 capsule by mouth at bedtime.   Yes Historical Provider, MD  ciprofloxacin (CIPRO) 500 MG tablet Take 1 tablet (500 mg total) by mouth 2 (two) times daily. 05/03/14  Yes Erick Blinks, MD  insulin detemir (LEVEMIR) 100 UNIT/ML injection Inject 0.2 mLs (20 Units total) into the skin daily. 05/03/14  Yes Erick Blinks, MD  latanoprost (XALATAN) 0.005 % ophthalmic solution Place 1 drop into the left eye at bedtime.   Yes Historical Provider, MD  lisinopril (PRINIVIL,ZESTRIL) 20 MG tablet Take 20 mg by mouth 2 (two) times daily.   Yes Historical Provider, MD  metFORMIN (GLUCOPHAGE) 500 MG tablet Take 1 tablet (500 mg total) by mouth 2 (two) times daily with a meal. 05/03/14  Yes Erick Blinks, MD  metoprolol (LOPRESSOR) 50 MG tablet Take 25 mg by mouth 2 (two) times daily. Takes with Lisinopril   Yes Historical Provider, MD  Multiple Vitamin (MULTIVITAMIN WITH MINERALS) TABS tablet Take 1 tablet by mouth daily.   Yes Historical Provider, MD  tadalafil (CIALIS) 20 MG tablet Take 20 mg by mouth daily as needed for erectile dysfunction.    Historical Provider, MD    Social History:  reports that he has never smoked. He does not have any smokeless tobacco history on file. He reports that he drinks alcohol. He reports that he does not use illicit drugs.  Family History  Problem Relation Age of Onset  . Diabetes Mellitus II Mother   . Diabetes Mellitus II Sister      All the positives are listed in BOLD  Review of  Systems:  HEENT: Headache, blurred vision, runny nose, sore throat Neck: Hypothyroidism, hyperthyroidism,,lymphadenopathy Chest : Shortness of breath, history of COPD, Asthma, pulmonary fibrosis Heart : Chest pain, history of coronary arterey disease GI:  Nausea, vomiting, diarrhea, constipation, GERD GU: Dysuria, urgency, frequency of urination, hematuria Neuro: Stroke, seizures, syncope Psych: Depression, anxiety, hallucinations   Physical Exam: Blood pressure 84/59, pulse 82, temperature 97.9 F (36.6 C), temperature source Oral, resp. rate 23, height 5\' 11"  (1.803 m), weight 70.308 kg (155 lb), SpO2 82.00%. Constitutional:   Patient is a well-developed and well-nourished male* in no acute distress and cooperative with exam. Head: Normocephalic and atraumatic Mouth: Mucus membranes dry Eyes: PERRL, EOMI, conjunctivae normal Neck: Supple, No Thyromegaly Cardiovascular: RRR, S1 normal, S2 normal Pulmonary/Chest: Bibasilar crackles Abdominal: Soft. Non-tender, non-distended, bowel sounds are normal, no masses, organomegaly, or guarding present.  Neurological: A&O x3, Strenght is normal and symmetric bilaterally, cranial nerve II-XII are grossly intact, no focal motor deficit, sensory intact to light touch bilaterally.  Extremities : Trace edema of the lower extremities  Labs on Admission:  Basic Metabolic Panel:  Recent Labs Lab 04/30/14 1747 05/01/14 0518 05/02/14 0528 05/03/14 0534 05/07/14 1346  NA 139 135* 132* 133* 131*  K 4.7 5.3 5.3 4.9 4.9  CL 101 100 100 97 96  CO2 23 24 19 24 21   GLUCOSE 153* 155* 157* 93 106*  BUN 40* 30* 17 16 37*  CREATININE 1.71* 1.31 1.07 1.11 1.78*  CALCIUM 9.5 8.2* 8.1* 8.9 8.9   Liver Function Tests: No results found for this basename: AST, ALT, ALKPHOS, BILITOT, PROT, ALBUMIN,  in the last 168 hours No results found for this basename: LIPASE, AMYLASE,  in the last 168 hours No results found for this basename: AMMONIA,  in the last 168  hours CBC:  Recent Labs Lab 05/01/14 0518 05/07/14 1346  WBC 6.8 7.1  NEUTROABS  --  5.5  HGB 15.4 13.9  HCT 43.6 39.5  MCV 92.2 95.0  PLT 125* 177   Cardiac Enzymes:  Recent Labs Lab 04/30/14 1939 05/07/14 1346  TROPONINI <0.30 <0.30    BNP (last 3 results)  Recent Labs  04/01/14 1350 04/02/14 0525 04/30/14 1115  PROBNP 1414.0* 1543.0* 1929.0*   CBG:  Recent Labs Lab 05/02/14 1618 05/02/14 2123 05/03/14 1159 05/03/14 1605 05/07/14 1421  GLUCAP 402* 152* 280* 281* 112*    Radiological Exams on Admission: Dg Chest Portable 1 View  05/07/2014   CLINICAL DATA:  Hypotension; history of pneumonia  EXAM: PORTABLE CHEST - 1 VIEW  COMPARISON:  Portable chest x-ray of April 30, 2014  FINDINGS: The left hemidiaphragm is much higher today than on the previous study. There is gaseous distention of bowel deep to the hemidiaphragm. The pulmonary interstitial markings remain diffusely increased bilaterally. The cardiac silhouette is mildly  enlarged. Portions of the left heart border are obscured. The pulmonary vascularity is not clearly engorged.  IMPRESSION: New elevation of the left hemidiaphragm is demonstrated. Stable pulmonary fibrotic changes bilaterally.   Electronically Signed   By: David  [place]   On: 05/07/2014 14:09    EKG: Independently reviewed. Atrial fibrillation with rate controlled   Assessment/Plan Principal Problem:   Weakness Active Problems:   Atrial fibrillation   Type II or unspecified type diabetes mellitus without mention of complication, uncontrolled   Pulmonary fibrosis   Acute renal failure   Chronic diastolic heart failure   UTI (lower urinary tract infection)  Weakness Multifactorial, as patient has chronic hypoxic respiratory failure, recent UTI with the urine again showing UTI today. Patient also has acute kidney injury now with creatinine 1.78. We'll admit the patient and address all the above problems.  Acute renal failure Patient  has been taking lisinopril which was discontinued by the discharging physician. Likely cause of patient's hypotension and acute renal failure. It appears prerenal, will check a urine sodium, urine creatinine. Will start gentle IV hydration with normal saline at 75 mL per hour. Check BMP in a.m.  UTI UA done today again shows 11-20 WBCs per high-power field. We'll continue with antibiotics IV Levaquin, urine culture has been obtained.  Hypotension Likely due to medications lisinopril and metoprolol, will hold breath the medications and will give IV fluids. Patient also has been started on IV Levaquin. Does not appear to be in septic shock  Lactic acidosis Patient has lactic acid of 4.3 which is likely due to hypoperfusion from hypotension. Will repeat lactic acid of per IV hydration in a.m.  Chronic hypoxic respiratory failure Patient has history of pulmonary fibrosis, chronic diastolic heart failure. He uses oxygen at home. Will continue oxygen in the hospital.  Type 2 diabetes mellitus Will continue with Levemir 20 units twice a day, and start sliding scale insulin with NovoLog. Will check CBG q. a.c. and at bedtime.  Atrial fibrillation Patient is currently in A. fib and heart rate is controlled. I have held metoprolol at this time due to hypotension, will need to restart metoprolol once patient's blood pressure improves. Will continue with anticoagulation with Eliquis.   Code status: Patient is full code  Family discussion: Admission, patients condition and plan of care including tests being ordered have been discussed with the patient and *family at bedside who indicate understanding and agree with the plan and Code Status.   Time Spent on Admission: 65 minutes  Exilda Wilhite S [facility] Pager: [phone] 05/07/2014, 4:24 PM  If 7PM-7AM, please contact night-coverage  [email or web address]  Password [email or web address]

## 2014-05-07 NOTE — ED Notes (Signed)
[facility] when he was found to be hypotensive.  Bp 78/48 on EMS arrival, in route was 80/50.  Has recv'd. 500 NS in route.  Recently hospitalized for hyperglycemia and new diagnosis of DMII

## 2014-05-08 DIAGNOSIS — E872 Acidosis, unspecified: Secondary | ICD-10-CM

## 2014-05-08 DIAGNOSIS — E119 Type 2 diabetes mellitus without complications: Secondary | ICD-10-CM

## 2014-05-08 DIAGNOSIS — I959 Hypotension, unspecified: Secondary | ICD-10-CM

## 2014-05-08 DIAGNOSIS — N179 Acute kidney failure, unspecified: Principal | ICD-10-CM

## 2014-05-08 LAB — GLUCOSE, CAPILLARY
Glucose-Capillary: 79 mg/dL (ref 70–99)
Glucose-Capillary: 174 mg/dL — ABNORMAL HIGH (ref 70–99)
Glucose-Capillary: 244 mg/dL — ABNORMAL HIGH (ref 70–99)
Glucose-Capillary: 230 mg/dL — ABNORMAL HIGH (ref 70–99)

## 2014-05-08 LAB — COMPREHENSIVE METABOLIC PANEL
ALK PHOS: 46 U/L (ref 39–117)
ALT: 27 U/L (ref 0–53)
AST: 21 U/L (ref 0–37)
Albumin: 2.5 g/dL — ABNORMAL LOW (ref 3.5–5.2)
Anion gap: 11 (ref 5–15)
BUN: 26 mg/dL — ABNORMAL HIGH (ref 6–23)
CALCIUM: 8.4 mg/dL (ref 8.4–10.5)
CO2: 23 meq/L (ref 19–32)
Chloride: 101 mEq/L (ref 96–112)
Creatinine, Ser: 1.32 mg/dL (ref 0.50–1.35)
GFR, EST AFRICAN AMERICAN: 59 mL/min — AB (ref >90)
GFR, EST NON AFRICAN AMERICAN: 51 mL/min — AB (ref >90)
GLUCOSE: 86 mg/dL (ref 70–99)
Potassium: 4.8 mEq/L (ref 3.7–5.3)
SODIUM: 135 meq/L — AB (ref 137–147)
Total Bilirubin: 1.4 mg/dL — ABNORMAL HIGH (ref 0.3–1.2)
Total Protein: 5.9 g/dL — ABNORMAL LOW (ref 6.0–8.3)

## 2014-05-08 LAB — CBC
HCT: 37 % — ABNORMAL LOW (ref 39.0–52.0)
HEMOGLOBIN: 12.7 g/dL — AB (ref 13.0–17.0)
MCH: 33.2 pg (ref 26.0–34.0)
MCHC: 34.3 g/dL (ref 30.0–36.0)
MCV: 96.6 fL (ref 78.0–100.0)
Platelets: 137 10*3/uL — ABNORMAL LOW (ref 150–400)
RBC: 3.83 MIL/uL — ABNORMAL LOW (ref 4.22–5.81)
RDW: 13 % (ref 11.5–15.5)
WBC: 7.3 10*3/uL (ref 4.0–10.5)

## 2014-05-08 MED ORDER — LEVOFLOXACIN 750 MG PO TABS
750.0000 mg | ORAL_TABLET | Freq: Every day | ORAL | Status: DC
  Administered 2014-05-08 – 2014-05-10 (×3): 750 mg via ORAL
  Filled 2014-05-08 (×3): qty 1

## 2014-05-08 MED ORDER — METOPROLOL TARTRATE 25 MG PO TABS
25.0000 mg | ORAL_TABLET | Freq: Two times a day (BID) | ORAL | Status: DC
  Administered 2014-05-08 – 2014-05-10 (×4): 25 mg via ORAL
  Filled 2014-05-08 (×5): qty 1

## 2014-05-08 NOTE — Progress Notes (Signed)
ANTIBIOTIC CONSULT NOTE - follow up  Pharmacy Consult for Levaquin Indication: UTI  No Known Allergies  Patient Measurements: Height: 5\' 11"  (180.3 cm) Weight: 162 lb 7.7 oz (73.7 kg) IBW/kg (Calculated) : 75.3  Vital Signs: Temp: 98.6 F (37 C) (07/25 0800) Temp src: Oral (07/25 0800) BP: 113/67 mmHg (07/25 0800) Pulse Rate: 98 (07/25 0800) Intake/Output from previous day: 07/24 0701 - 07/25 0700 In: 2321.3 [P.O.:440; I.V.:1231.3; IV Piggyback:650] Out: 2200 [Urine:2200] Intake/Output from this shift: Total I/O In: -  Out: 175 [Urine:175]  Labs:  Recent Labs  05/07/14 1346 05/07/14 1410 05/08/14 0521  WBC 7.1  --  7.3  HGB 13.9  --  12.7*  PLT 177  --  137*  LABCREA  --  87.79  --   CREATININE 1.78*  --  1.32   Estimated Creatinine Clearance: 50.4 ml/min (by C-G formula based on Cr of 1.32). No results found for this basename: Rolm GalaVANCOTROUGH, VANCOPEAK, VANCORANDOM, GENTTROUGH, GENTPEAK, GENTRANDOM, TOBRATROUGH, TOBRAPEAK, TOBRARND, AMIKACINPEAK, AMIKACINTROU, AMIKACIN,  in the last 72 hours   Microbiology: Recent Results (from the past 720 hour(s))  URINE CULTURE     Status: None   Collection Time    04/30/14  4:08 PM      Result Value Ref Range Status   Specimen Description Urine   Final   Special Requests NONE   Final   Culture  Setup Time     Final   Value: 04/30/2014 23:07     Performed at [facility]   Colony Count     Final   Value: 45,000 COLONIES/ML     Performed at [facility]   Culture     Final   Value: PROTEUS MIRABILIS     Performed at [facility]   Report Status 05/03/2014 FINAL   Final   Organism ID, Bacteria PROTEUS MIRABILIS   Final  MRSA PCR SCREENING     Status: None   Collection Time    04/30/14  4:49 PM      Result Value Ref Range Status   MRSA by PCR NEGATIVE  NEGATIVE Final   Comment:            The GeneXpert MRSA Assay (FDA     approved for NASAL specimens     only), is one component of a   comprehensive MRSA colonization     surveillance program. It is not     intended to diagnose MRSA     infection nor to guide or     monitor treatment for     MRSA infections.  CULTURE, BLOOD (ROUTINE X 2)     Status: None   Collection Time    05/07/14  7:03 PM      Result Value Ref Range Status   Specimen Description Blood BLOOD LEFT HAND   Final   Special Requests AB [facility]6CC   Final   Culture PENDING   Incomplete   Report Status PENDING   Incomplete   Medical History: Past Medical History  Diagnosis Date  . Essential hypertension, benign   . Type 2 diabetes mellitus   . Atrial fibrillation     Dr. Earna CoderZachary [facility]  . Mixed hyperlipidemia   . Pulmonary fibrosis     dx 11/2013  . Pneumonia     11/2013  . Chronic diastolic heart failure   . On home O2     2L N/C prn  . COPD (chronic obstructive pulmonary disease)  Levaquin 7/24 >>  Assessment: 73yo male with elevated SCr.  Estimated Creatinine Clearance: 50.4 ml/min (by C-G formula based on Cr of 1.32). Asked to initiate Levaquin for UTI.  SCr has improved.  Afebrile.  Goal of Therapy:  Eradicate infection.  Plan:   Switch to Levaquin 750mg  PO q24hrs  Monitor labs, progress, renal fxn, and cultures  Valrie Hart A 05/08/2014,9:04 AM

## 2014-05-08 NOTE — Progress Notes (Signed)
Report called to C.Morris,RN. Patient transferred to 317 in stable condition with NT via wheelchair.

## 2014-05-08 NOTE — Progress Notes (Signed)
PROGRESS NOTE  Hartford Polirthur Coke ZOX:[id number] DOB: 10/28/1940 DOA: 05/07/2014 PCP: Lucius ConnOBERTSON, ANTHONY T, PA-C  Summary: 73 year old man with history of diabetes mellitus type 2, recently hospitalized and discharged 7/20 with hyperosmolar nonketotic hyperglycemia that began after starting prednisone for pulmonary fibrosis, as well as UTI and hypotension with instructions to stop lisinopril on discharge. He returned to the emergency department 7/24 when [facility] He was found to be hypotensive, to have acute renal failure and possible UTI and was admitted for further evaluation.  Assessment/Plan: 1. Hypotension with lactic acidosis very likely secondary to antihypertensives, patient was instructed to stop taking lisinopril but did not. Hypotension resolved. No signs or symptoms of sepsis. 2. Acute renal failure. Likely secondary to ongoing lisinopril and possibly dehydration. Nearly resolved with IV fluids. 3. Possible UTI, recently treated with ciprofloxacin. He has not yet completed treatment. Continue therapy based on sensitivities from recent culture. 4. Generalized weakness. Likely improve. Plan up today. 5. Essential hypertension. Now stable. 6. Diabetes mellitus type 2 uncontrolled by hemoglobin A1c. Capillary blood sugars stable. Normal anion gap. 7. Thrombocytopenia. Mild intermittent. Followup as an outpatient. 8. Atrial fibrillation. Rate control. Restart metoprolol when able. Continue anticoagulation with Eliquis. 9. Chronic diastolic heart failure. Appears stable. 10. COPD, pulmonary fibrosis with chronic hypoxic respiratory failure on home oxygen and prednisone.   Overall much improved. Hypotension has resolved. No signs or symptoms of sepsis or systemic infection. Plan stop IV fluids, up with assistance, ambulating the hallway.  Continue antibiotics for UTI, followup culture.  Suggest outpatient followup for intermittent thrombocytopenia  if clinically indicated.  Resume metoprolol for rate control with history of atrial fibrillation. Consider alternative. PE as an outpatient given COPD and pulmonary fibrosis.  Code Status: full code DVT prophylaxis: Eliquis Family Communication: None present Disposition Plan: Home 24 hours  Brendia Sacksaniel Muhanad Torosyan, MD  [facility]  Pager [phone] If 7PM-7AM, please contact night-coverage at [email or web address], password [email or web address] 05/08/2014, 7:36 AM  LOS: 1 day   Consultants:     Procedures:    Antibiotics:  Levaquin 7/24 >>  HPI/Subjective: Feels better today. No pain, nausea or vomiting. No complaints.  Objective: Filed Vitals:   05/08/14 0500 05/08/14 0530 05/08/14 0600 05/08/14 0630  BP: 74/44 116/58 100/61 139/79  Pulse: 89 90  66  Temp:      TempSrc:      Resp: 18 21 20 21   Height:      Weight: 73.7 kg (162 lb 7.7 oz)     SpO2: 92% 91%  81%    Intake/Output Summary (Last 24 hours) at 05/08/14 0736 Last data filed at 05/08/14 [id number]  Gross per 24 hour  Intake    850 ml  Output   2200 ml  Net  -1350 ml     Filed Weights   05/07/14 1326 05/08/14 0500  Weight: 70.308 kg (155 lb) 73.7 kg (162 lb 7.7 oz)    Exam:     Afebrile, vital signs are stable with resolution of hypotension. Gen. Appears calm and comfortable. Nontoxic.  Psych. Alert. Speech fluent and clear. Grossly normal mentation.  Cardiovascular. Regular rate and rhythm. No murmur, rub or gallop. No lower extremity edema. Telemetry shows atrial fibrillation.  Respiratory. Clear to auscultation bilaterally. No wheezes, rales or rhonchi. Normal respiratory effort.  Abdomen. Soft.  Skin. Grossly unremarkable.  Data Reviewed: I/O: Urine output 2200  Chemistry: Blood sugars stable. Creatinine vastly improved, BUN trending rapidly towards normal.  Heme: Hemoglobin 12.7, platelet  count 137. Suspect dilutional effects. No evidence of bleeding.  ID: Urinalysis was grossly positive  Imaging: Chest  x-ray of pulmonary fibrosis.  EKG with atrial fibrillation, RVH with repolarization abnormality, anterolateral T wave inversion, no significant change compared to previous study 04/30/2014  Scheduled Meds: . apixaban  5 mg Oral BID  . aspirin EC  81 mg Oral Daily  . atorvastatin  20 mg Oral QHS  . insulin aspart  0-9 Units Subcutaneous TID WC  . insulin detemir  20 Units Subcutaneous Daily  . latanoprost  1 drop Left Eye QHS  . levofloxacin (LEVAQUIN) IV  750 mg Intravenous Q48H   Continuous Infusions: . sodium chloride 75 mL/hr at 05/07/14 1435    Principal Problem:   Hypotension, unspecified Active Problems:   Atrial fibrillation   Type II or unspecified type diabetes mellitus without mention of complication, uncontrolled   Pulmonary fibrosis   Acute renal failure   Chronic diastolic heart failure   UTI (lower urinary tract infection)   Weakness   Lactic acidosis   DM type 2 (diabetes mellitus, type 2)   Time spent 25 minutes

## 2014-05-08 NOTE — Progress Notes (Signed)
Patient ambulated well 2 times around unit. No complaints. HR not greater than 123 during ambulation.

## 2014-05-08 NOTE — Plan of Care (Signed)
Problem: Phase II Progression Outcomes Goal: Progress activity as tolerated unless otherwise ordered Outcome: Completed/Met Date Met:  05/08/14 Pt walked hall for approximately 250 ft with stand by assist. Steady gait and no SOB. Per pt he will walk again later in am. Will continue to monitor.

## 2014-05-08 NOTE — Progress Notes (Signed)
Patient c/o bleeding from penis. Monitored for approximately an hour with scant bleeding noted on cloth. Instructed patient to inform RN if it happens again. Dr. Irene LimboGoodrich notified. No gross hematuria noted.

## 2014-05-09 LAB — URINE CULTURE

## 2014-05-09 LAB — GLUCOSE, CAPILLARY
GLUCOSE-CAPILLARY: 262 mg/dL — AB (ref 70–99)
Glucose-Capillary: 165 mg/dL — ABNORMAL HIGH (ref 70–99)
Glucose-Capillary: 262 mg/dL — ABNORMAL HIGH (ref 70–99)
Glucose-Capillary: 151 mg/dL — ABNORMAL HIGH (ref 70–99)

## 2014-05-09 MED ORDER — METOPROLOL TARTRATE 25 MG PO TABS: 12.5000 mg | ORAL_TABLET | Freq: Two times a day (BID) | ORAL | Status: DC

## 2014-05-09 MED ORDER — FLUCONAZOLE 100 MG PO TABS
100.0000 mg | ORAL_TABLET | Freq: Every day | ORAL | Status: DC
  Administered 2014-05-10: 100 mg via ORAL
  Filled 2014-05-09: qty 1

## 2014-05-09 MED ORDER — FLUCONAZOLE 100 MG PO TABS
200.0000 mg | ORAL_TABLET | Freq: Once | ORAL | Status: AC
Start: 2014-05-09 — End: 2014-05-09
  Administered 2014-05-09: 200 mg via ORAL
  Filled 2014-05-09: qty 2

## 2014-05-09 MED ORDER — FLUCONAZOLE 100 MG PO TABS: 100.0000 mg | ORAL_TABLET | Freq: Every day | ORAL | Status: DC

## 2014-05-09 NOTE — Progress Notes (Signed)
PROGRESS NOTE  Hartford Polirthur Volland UEA:[id number] DOB: 02/16/1941 DOA: 05/07/2014 PCP: Lucius ConnOBERTSON, ANTHONY T, PA-C  Summary: 73 year old man with history of diabetes mellitus type 2, recently hospitalized and discharged 7/20 with hyperosmolar nonketotic hyperglycemia that began after starting prednisone for pulmonary fibrosis, as well as UTI and hypotension with instructions to stop lisinopril on discharge. He returned to the emergency department 7/24 when [facility]. He was found to be hypotensive, to have acute renal failure and possible UTI and was admitted for further evaluation.  Assessment/Plan: 1. Hypotension with lactic acidosis very likely secondary to antihypertensives, patient was instructed to stop taking lisinopril but did not. Resolved. No signs or symptoms of sepsis. 2. Acute renal failure. Likely secondary to ongoing lisinopril and possibly dehydration. Resolved. 3. Candida UTI, followup repeat culture. 4. Generalized weakness. Much improved. Ambulating well. 5. Essential hypertension. Stable. 6. Diabetes mellitus type 2 uncontrolled by hemoglobin A1c. Capillary blood sugars stable.  7. Thrombocytopenia. Mild intermittent. Followup as an outpatient. 8. Atrial fibrillation. Continue anticoagulation with Eliquis. 9. Chronic diastolic heart failure. Stable. 10. COPD, pulmonary fibrosis with chronic hypoxic respiratory failure on home oxygen and prednisone. Stable.   Much improved today. Plan discharge home.  Continue antibiotics for UTI, followup culture, Diflucan.  Suggest outpatient followup for intermittent thrombocytopenia if clinically indicated.  Decrease metoprolol dose. Patient instructed to discontinue lisinopril. These changes were reviewed with the patient.  Code Status: full code DVT prophylaxis: Eliquis Family Communication: None present Disposition Plan: Home 24 hours  Brendia Sacksaniel Tabithia Stroder, MD  [facility]  Pager  [phone] If 7PM-7AM, please contact night-coverage at [email or web address], password [email or web address] 05/09/2014, 11:32 AM  LOS: 2 days   Consultants:     Procedures:    Antibiotics:  Levaquin 7/24 >>  HPI/Subjective: Ambulated 250 feet yesterday, did well.  Feeling much better. Ready to go home.  Objective: Filed Vitals:   05/08/14 1230 05/08/14 2029 05/09/14 0446 05/09/14 1027  BP: 111/66 101/58 109/65 101/54  Pulse: 94 76 82 88  Temp:  97.9 F (36.6 C) 98.7 F (37.1 C)   TempSrc:  Oral Oral   Resp: 22 22 22 20   Height:      Weight:      SpO2: 90% 99% 91%     Intake/Output Summary (Last 24 hours) at 05/09/14 1132 Last data filed at 05/09/14 0905  Gross per 24 hour  Intake    840 ml  Output    975 ml  Net   -135 ml     Filed Weights   05/07/14 1326 05/08/14 0500  Weight: 70.308 kg (155 lb) 73.7 kg (162 lb 7.7 oz)    Exam:     Afebrile, vital signs are stable with resolution of hypotension. Gen. Appears calm, comfortable. Psych. Speech fluent and clear. Grossly normal mentation. Cardiovascular. Regular rate and rhythm. No murmur, rub or gallop. Respiratory. Clear to auscultation bilaterally. No wheezes, rales or rhonchi. Normal respiratory effort.  Data Reviewed: I/O: Urine output 1550  Chemistry: Blood sugars stable. No hypoglycemia.  ID: Urine and blood cultures pending.  Scheduled Meds: . apixaban  5 mg Oral BID  . aspirin EC  81 mg Oral Daily  . atorvastatin  20 mg Oral QHS  . insulin aspart  0-9 Units Subcutaneous TID WC  . insulin detemir  20 Units Subcutaneous Daily  . latanoprost  1 drop Left Eye QHS  . levofloxacin  750 mg Oral q1800  . metoprolol tartrate  25 mg Oral BID  Continuous Infusions:    Principal Problem:   Hypotension, unspecified Active Problems:   Atrial fibrillation   Type II or unspecified type diabetes mellitus without mention of complication, uncontrolled   Pulmonary fibrosis   Acute renal failure   Chronic diastolic heart  failure   UTI (lower urinary tract infection)   Weakness   Lactic acidosis   DM type 2 (diabetes mellitus, type 2)

## 2014-05-09 NOTE — Discharge Summary (Addendum)
Physician Discharge Summary  [name] Sherman ZOX:[id number] DOB: 12/19/1940 DOA: 05/07/2014  PCP: [name] Axe, PA-C  Admit date: 05/07/2014 Discharge date: 05/09/2014  Recommendations for Outpatient Follow-up:  1. Hypotension/hypertension. Lisinopril stopped. Metoprolol decreased. Continue to follow blood pressure closely. 2. Resolution of UTI. F/u culture. 3. Thrombocytopenia of unclear significance, mild intermittent. Consider outpatient evaluation as clinically indicated.  4. Elevated left hemidiaphragm. Completely asymptomatic. Consider outpatient evaluation if clinically indicated.   Follow-up Information   Follow up with ROBERTSON, ANTHONY T, PA-C. Schedule an appointment as soon as possible for a visit in 1 week.   Specialty:  Physician Assistant   Contact information:   [address] [place] [address] [phone]      Discharge Diagnoses:  1. Hypotension with lactic acidosis on admission, no evidence of sepsis 2. Acute renal failure 3. Candidate to 4. Generalized weakness 5. Diabetes mellitus type 2 uncontrolled by hemoglobin A1c 6. Thrombocytopenia 7. Atrial fibrillation 8. Chronic hypoxic respiratory failure on oxygen 9. Chronic diastolic heart failure  Discharge Condition: Improved Disposition: Home, resume home health  Diet recommendation: Heart healthy diabetic diet  Filed Weights   05/07/14 1326 05/08/14 0500  Weight: 70.308 kg (155 lb) 73.7 kg (162 lb 7.7 oz)    History of present illness:  73 year old man with history of diabetes mellitus type 2, recently hospitalized and discharged 7/20 with hyperosmolar nonketotic hyperglycemia that began after starting prednisone for pulmonary fibrosis, as well as UTI and hypotension with instructions to stop lisinopril on discharge. He returned to the emergency department 7/24 when he was found to be [facility]. He was found to be hypotensive, to have acute renal failure and possible  UTI and was admitted for further evaluation.  Hospital Course:  [name] was treated empirically with IV fluids and antibiotics, hypotension rapidly resolved as he. Repeat urine culture grew yeast and patient was started on Diflucan. Hypotension is likely secondary to misunderstanding of discharge instructions. He has been clearly instructed to discontinue lisinopril and decrease his dose of metoprolol. He is now stable for discharge.   1. Hypotension with lactic acidosis very likely secondary to antihypertensives, patient was instructed to stop taking lisinopril but did not. Resolved. No signs or symptoms of sepsis. 2. Acute renal failure. Likely secondary to ongoing lisinopril and possibly dehydration. Resolved. 3. Candida, Coag negative Staph UTI, followup repeat culture. 4. Generalized weakness. Much improved. Ambulating well. Home Health PT. Patient declined skilled. 5. Essential hypertension. Stable. 6. Diabetes mellitus type 2 uncontrolled by hemoglobin A1c. Capillary blood sugars stable.  7. Thrombocytopenia. Mild intermittent. Followup as an outpatient. 8. Atrial fibrillation. Continue anticoagulation with Eliquis. 9. Chronic diastolic heart failure. Stable. 10. COPD, pulmonary fibrosis with chronic hypoxic respiratory failure on home oxygen and prednisone. Stable.  Consultants: none Procedures: none Antibiotics:  Levaquin 7/24 >> 7/26 Diflucan 7/26 >>7/29  Discharge Instructions  Discharge Instructions   Diet - low sodium heart healthy    Complete by:  As directed      Diet Carb Modified    Complete by:  As directed      Discharge instructions    Complete by:  As directed   Call your physician or seek immediate medical attention for generalized weakness, worsening of condition, pain, blood sugar less than 70 or greater and 400. Note that you should stop taking lisinopril. Also note that the dose of metoprolol (a blood pressure medication) has been decreased.     Increase  activity slowly  Complete by:  As directed             Medication List    STOP taking these medications       ciprofloxacin 500 MG tablet  Commonly known as:  CIPRO     lisinopril 20 MG tablet  Commonly known as:  PRINIVIL,ZESTRIL      TAKE these medications       apixaban 5 MG Tabs tablet  Commonly known as:  ELIQUIS  Take 5 mg by mouth 2 (two) times daily.     aspirin EC 81 MG tablet  Take 81 mg by mouth daily.     atorvastatin 10 MG tablet  Commonly known as:  LIPITOR  Take 1 tablet (10 mg total) by mouth at bedtime.     Bee Pollen 550 MG Caps  Take 1 capsule by mouth at bedtime.     fluconazole 100 MG tablet  Commonly known as:  DIFLUCAN  Take 1 tablet (100 mg total) by mouth daily. First dose 7/27.     insulin detemir 100 UNIT/ML injection  Commonly known as:  LEVEMIR  Inject 0.2 mLs (20 Units total) into the skin daily.     latanoprost 0.005 % ophthalmic solution  Commonly known as:  XALATAN  Place 1 drop into the left eye at bedtime.     levofloxacin 750 MG tablet  Commonly known as:  LEVAQUIN  Take 1 tablet (750 mg total) by mouth daily at 6 PM.     metFORMIN 500 MG tablet  Commonly known as:  GLUCOPHAGE  Take 1 tablet (500 mg total) by mouth 2 (two) times daily with a meal.     metoprolol tartrate 25 MG tablet  Commonly known as:  LOPRESSOR  Take 0.5 tablets (12.5 mg total) by mouth 2 (two) times daily.     multivitamin with minerals Tabs tablet  Take 1 tablet by mouth daily.     tadalafil 20 MG tablet  Commonly known as:  CIALIS  Take 20 mg by mouth daily as needed for erectile dysfunction.       No Known Allergies  The results of significant diagnostics from this hospitalization (including imaging, microbiology, ancillary and laboratory) are listed below for reference.    Significant Diagnostic Studies: Dg Chest Portable 1 View  05/07/2014   CLINICAL DATA:  Hypotension; history of pneumonia  EXAM: PORTABLE CHEST - 1 VIEW   COMPARISON:  Portable chest x-ray of April 30, 2014  FINDINGS: The left hemidiaphragm is much higher today than on the previous study. There is gaseous distention of bowel deep to the hemidiaphragm. The pulmonary interstitial markings remain diffusely increased bilaterally. The cardiac silhouette is mildly enlarged. Portions of the left heart border are obscured. The pulmonary vascularity is not clearly engorged.  IMPRESSION: New elevation of the left hemidiaphragm is demonstrated. Stable pulmonary fibrotic changes bilaterally.   Electronically Signed   By: David  [place]   On: 05/07/2014 14:09   Microbiology: Recent Results (from the past 240 hour(s))  URINE CULTURE     Status: None   Collection Time    05/07/14  2:10 PM      Result Value Ref Range Status   Specimen Description URINE, CLEAN CATCH   Final   Special Requests NONE   Final   Culture  Setup Time     Final   Value: 05/08/2014 01:20     Performed at [facility] Count     Final  Value: 45,000 COLONIES/ML     Performed at [facility]   Culture     Final   Value: YEAST     Performed at [facility]   Report Status 05/09/2014 FINAL   Final  CULTURE, BLOOD (ROUTINE X 2)     Status: None   Collection Time    05/07/14  6:37 PM      Result Value Ref Range Status   Specimen Description BLOOD LEFT ARM   Final   Special Requests BOTTLES DRAWN AEROBIC AND ANAEROBIC 6CC   Final   Culture NO GROWTH 3 DAYS   Final   Report Status PENDING   Incomplete  CULTURE, BLOOD (ROUTINE X 2)     Status: None   Collection Time    05/07/14  7:03 PM      Result Value Ref Range Status   Specimen Description Blood BLOOD LEFT HAND   Final   Special Requests AB 6CC   Final   Culture NO GROWTH 3 DAYS   Final   Report Status PENDING   Incomplete  URINE CULTURE     Status: None   Collection Time    05/07/14  7:30 PM      Result Value Ref Range Status   Specimen Description URINE, RANDOM   Final   Special Requests  URINE, RANDOM   Final   Culture  Setup Time     Final   Value: 05/08/2014 21:21     Performed at [facility]   Colony Count     Final   Value: 75,000 COLONIES/ML     Performed at [facility]   Culture     Final   Value: STAPHYLOCOCCUS SPECIES (COAGULASE NEGATIVE)     Note: RIFAMPIN AND GENTAMICIN SHOULD NOT BE USED AS SINGLE DRUGS FOR TREATMENT OF STAPH INFECTIONS.     Performed at [facility]   Report Status PENDING   Incomplete     Labs: Basic Metabolic Panel:  Recent Labs Lab 05/07/14 1346 05/08/14 0521  NA 131* 135*  K 4.9 4.8  CL 96 101  CO2 21 23  GLUCOSE 106* 86  BUN 37* 26*  CREATININE 1.78* 1.32  CALCIUM 8.9 8.4   Liver Function Tests:  Recent Labs Lab 05/08/14 0521  AST 21  ALT 27  ALKPHOS 46  BILITOT 1.4*  PROT 5.9*  ALBUMIN 2.5*   CBC:  Recent Labs Lab 05/07/14 1346 05/08/14 0521  WBC 7.1 7.3  NEUTROABS 5.5  --   HGB 13.9 12.7*  HCT 39.5 37.0*  MCV 95.0 96.6  PLT 177 137*   Cardiac Enzymes:  Recent Labs Lab 05/07/14 1346  TROPONINI <0.30   CBG:  Recent Labs Lab 05/09/14 1639 05/09/14 2031 05/10/14 0745 05/10/14 1114 05/10/14 1701  GLUCAP 262* 151* 109* 297* 164*    Principal Problem:   Hypotension, unspecified Active Problems:   Atrial fibrillation   Type II or unspecified type diabetes mellitus without mention of complication, uncontrolled   Pulmonary fibrosis   Acute renal failure   Chronic diastolic heart failure   UTI (lower urinary tract infection)   Weakness   Lactic acidosis   DM type 2 (diabetes mellitus, type 2)   Time coordinating discharge: 40 minutes  Signed:  Brendia Sacksaniel Margan Elias, MD [facility] 05/09/2014, 2:46 PM

## 2014-05-09 NOTE — Progress Notes (Signed)
ANTIBIOTIC CONSULT NOTE - follow up  Pharmacy Consult for Levaquin Indication: UTI  No Known Allergies  Patient Measurements: Height: 5\' 11"  (180.3 cm) Weight: 162 lb 7.7 oz (73.7 kg) IBW/kg (Calculated) : 75.3  Vital Signs: Temp: 98.7 F (37.1 C) (07/26 0446) Temp src: Oral (07/26 0446) BP: 109/65 mmHg (07/26 0446) Pulse Rate: 82 (07/26 0446) Intake/Output from previous day: 07/25 0701 - 07/26 0700 In: 441.3 [P.O.:240; I.V.:201.3] Out: 1550 [Urine:1550] Intake/Output from this shift: Total I/O In: -  Out: 100 [Urine:100]  Labs:  Recent Labs  05/07/14 1346 05/07/14 1410 05/08/14 0521  WBC 7.1  --  7.3  HGB 13.9  --  12.7*  PLT 177  --  137*  LABCREA  --  87.79  --   CREATININE 1.78*  --  1.32   Estimated Creatinine Clearance: 50.4 ml/min (by C-G formula based on Cr of 1.32). No results found for this basename: Rolm Gala, VANCORANDOM, GENTTROUGH, GENTPEAK, GENTRANDOM, TOBRATROUGH, TOBRAPEAK, TOBRARND, AMIKACINPEAK, AMIKACINTROU, AMIKACIN,  in the last 72 hours   Microbiology: Recent Results (from the past 720 hour(s))  URINE CULTURE     Status: None   Collection Time    04/30/14  4:08 PM      Result Value Ref Range Status   Specimen Description Urine   Final   Special Requests NONE   Final   Culture  Setup Time     Final   Value: 04/30/2014 23:07     Performed at [facility]   Colony Count     Final   Value: 45,000 COLONIES/ML     Performed at [facility]   Culture     Final   Value: PROTEUS MIRABILIS     Performed at [facility]   Report Status 05/03/2014 FINAL   Final   Organism ID, Bacteria PROTEUS MIRABILIS   Final  MRSA PCR SCREENING     Status: None   Collection Time    04/30/14  4:49 PM      Result Value Ref Range Status   MRSA by PCR NEGATIVE  NEGATIVE Final   Comment:            The GeneXpert MRSA Assay (FDA     approved for NASAL specimens     only), is one component of a     comprehensive MRSA  colonization     surveillance program. It is not     intended to diagnose MRSA     infection nor to guide or     monitor treatment for     MRSA infections.  URINE CULTURE     Status: None   Collection Time    05/07/14  2:10 PM      Result Value Ref Range Status   Specimen Description URINE, CLEAN CATCH   Final   Special Requests NONE   Final   Culture  Setup Time     Final   Value: 05/08/2014 01:20     Performed at [facility] Count     Final   Value: 45,000 COLONIES/ML     Performed at [facility]   Culture     Final   Value: YEAST     Performed at [facility]   Report Status 05/09/2014 FINAL   Final  CULTURE, BLOOD (ROUTINE X 2)     Status: None   Collection Time    05/07/14  7:03 PM  Result Value Ref Range Status   Specimen Description Blood BLOOD LEFT HAND   Final   Special Requests AB [facility]CC   Final   Culture PENDING   Incomplete   Report Status PENDING   Incomplete   Medical History: Past Medical History  Diagnosis Date  . Essential hypertension, benign   . Type 2 diabetes mellitus   . [facility]  . Mixed hyperlipidemia   . Pulmonary fibrosis     dx 11/2013  . Pneumonia     11/2013  . Chronic diastolic heart failure   . On home O2     2L N/C prn  . COPD (chronic obstructive pulmonary disease)    Levaquin 7/24 >>  Assessment: 73yo male with elevated SCr on admission but improved with hydration.  Estimated Creatinine Clearance: 50.4 ml/min (by C-G formula based on Cr of 1.32). Afebrile.  Urine cx with 45K colonies of yeast/  Blood cx pending.  Goal of Therapy:  Eradicate infection.  Plan:   Levaquin 750mg  PO q24hrs  Duration of therapy per MD (anticipate 3-5 days)  Pharmacy will sign off, please reconsult if needed  Thanks, Valrie HartHall, Noemie Devivo A 05/09/2014,9:01 AM

## 2014-05-10 ENCOUNTER — Inpatient Hospital Stay (HOSPITAL_COMMUNITY): Payer: Medicare Other

## 2014-05-10 DIAGNOSIS — I5032 Chronic diastolic (congestive) heart failure: Secondary | ICD-10-CM

## 2014-05-10 LAB — GLUCOSE, CAPILLARY
GLUCOSE-CAPILLARY: 109 mg/dL — AB (ref 70–99)
Glucose-Capillary: 297 mg/dL — ABNORMAL HIGH (ref 70–99)
Glucose-Capillary: 164 mg/dL — ABNORMAL HIGH (ref 70–99)

## 2014-05-10 MED ORDER — ATORVASTATIN CALCIUM 10 MG PO TABS: 10.0000 mg | ORAL_TABLET | Freq: Every day | ORAL | Status: DC

## 2014-05-10 MED ORDER — LEVOFLOXACIN 750 MG PO TABS: 750.0000 mg | ORAL_TABLET | Freq: Every day | ORAL | Status: DC

## 2014-05-10 MED ORDER — FUROSEMIDE 20 MG PO TABS
20.0000 mg | ORAL_TABLET | Freq: Once | ORAL | Status: AC
  Administered 2014-05-10: 20 mg via ORAL
  Filled 2014-05-10: qty 1

## 2014-05-10 NOTE — Care Management Utilization Note (Signed)
UR completed 

## 2014-05-10 NOTE — Progress Notes (Signed)
He feels very good. Breathing well. He declines to go to a skilled nursing facility. He wants to go home this evening.  Vitals are stable. Repeat chest x-ray stable without new findings. Scarring again seen. Urine cultures growing coag-negative staph suspect second sensitive to 4 quinolones. Culture will be followed up.  Given clinical stability plan discharge home with outpatient followup. Home health.  Brendia Sacksaniel Dhillon [place], MD [facility] [phone]

## 2014-05-10 NOTE — Clinical Social Work Note (Signed)
CSW consulted for possible SNF at pt's request as PT recommends home health. Pt sitting in room and immediately states he has arranged for his sister and cousin to come stay with him when he is d/c. CSW asked about SNF which is what was reported that he had requested. He states he is no longer interested and wants to go home with home health. MD and CM notified. CSW will sign off, but can be reconsulted.   Derenda FennelKara Maddi Collar, KentuckyLCSW [phone]

## 2014-05-10 NOTE — Progress Notes (Signed)
PROGRESS NOTE  [name] Sherman ZOX:[id number] DOB: [birth year] DOA: 05/07/2014 PCP: Lucius Conn  Summary: 73 year old man with history of diabetes mellitus type 2, recently hospitalized and discharged 7/20 with hyperosmolar nonketotic hyperglycemia that began after starting prednisone for pulmonary fibrosis, as well as UTI and hypotension with instructions to stop lisinopril on discharge. He returned to the emergency department 7/24 when [facility] He was found to be hypotensive, to have acute renal failure and possible UTI and was admitted for further evaluation.  Assessment/Plan: 1. Hypotension with lactic acidosis very likely secondary to antihypertensives, patient was instructed to stop taking lisinopril but did not. Resolved. No signs or symptoms of sepsis. 2. Acute renal failure. Likely secondary to ongoing lisinopril and possibly dehydration. Resolved. 3. Candida UTI, followup repeat culture. 4. Generalized weakness. Much improved. Ambulating well. 5. Essential hypertension. Stable. 6. Diabetes mellitus type 2 uncontrolled by hemoglobin A1c. Capillary blood sugars stable.  7. Thrombocytopenia. Mild intermittent. Followup as an outpatient. 8. Atrial fibrillation. Continue anticoagulation with Eliquis. 9. Chronic diastolic heart failure. Stable. 10. COPD, pulmonary fibrosis with chronic hypoxic respiratory failure on home oxygen and prednisone. Likely at baseline.   No significant change compared to yesterday. Home health recommended by physical therapy. Chest x-ray shows no acute changes when stable elevated left hemidiaphragm. Hypoxia appears stable. There is very crackles likely reflect pulmonary fibrosis but will give one dose of Lasix.  Plan to followup urine culture, continue current treatments, patient has requested pulmonary consultation planned for 7/28. Likely discharge home thereafter.  Continue antibiotics for UTI,  followup culture.  Suggest outpatient followup for intermittent thrombocytopenia if clinically indicated.  Continue current metoprolol dose. Patient instructed to discontinue lisinopril.  Code Status: full code DVT prophylaxis: Eliquis Family Communication: None present Disposition Plan: Home 24 hours  Brendia Sacks, MD  [facility]  Pager [phone] If 7PM-7AM, please contact night-coverage at [email or web address], password [facility] 05/10/2014, 11:54 AM  LOS: 3 days   Consultants:     Procedures:    Antibiotics:  Levaquin 7/24 >>  HPI/Subjective: Was ambulated yesterday without his oxygen and became very short of breath. He was not sure that he be able to take care of himself at home therefore discharge was canceled.  Today he feels generally weak and short of breath with exertion.  Objective: Filed Vitals:   05/09/14 1500 05/09/14 2029 05/10/14 0420 05/10/14 0938  BP: 105/65 102/68 105/64   Pulse: 97 103 90   Temp:  97.9 F (36.6 C) 97.8 F (36.6 C)   TempSrc:  Oral Oral   Resp: 20 20 20    Height:      Weight:      SpO2: 94% 95% 97% 86%    Intake/Output Summary (Last 24 hours) at 05/10/14 1154 Last data filed at 05/10/14 0931  Gross per 24 hour  Intake   1200 ml  Output   1352 ml  Net   -152 ml     Filed Weights   05/07/14 1326 05/08/14 0500  Weight: 70.308 kg (155 lb) 73.7 kg (162 lb 7.7 oz)    Exam:     Afebrile, VSS. Stable hypoxia. Gen. Appears calm, comfortable sitting in chair. Psych. Alert. Speech fluent and appropriate. Cardiovascular. Regular rate and rhythm. No murmur, rub or gallop. No lower extremity edema. Respiratory. Respiratory crackles posteriorly. Fair movement. No frank wheezes or rhonchi. Mild increased respiratory effort. Will speak in full sentences.  Data Reviewed: I/O: Urine output 1450  Chemistry: Blood sugars stable. No hypoglycemia.  ID: Urine culture with staph coag negative, sensitivity pending. Blood cultures  pending.  Chest x-ray: No acute changes. Stable elevated left hemidiaphragm.  Scheduled Meds: . apixaban  5 mg Oral BID  . aspirin EC  81 mg Oral Daily  . atorvastatin  20 mg Oral QHS  . fluconazole  100 mg Oral Daily  . insulin aspart  0-9 Units Subcutaneous TID WC  . insulin detemir  20 Units Subcutaneous Daily  . latanoprost  1 drop Left Eye QHS  . levofloxacin  750 mg Oral q1800  . metoprolol tartrate  25 mg Oral BID   Continuous Infusions:    Principal Problem:   Hypotension, unspecified Active Problems:   Atrial fibrillation   Type II or unspecified type diabetes mellitus without mention of complication, uncontrolled   Pulmonary fibrosis   Acute renal failure   Chronic diastolic heart failure   UTI (lower urinary tract infection)   Weakness   Lactic acidosis   DM type 2 (diabetes mellitus, type 2)   Time: 20 minutes.

## 2014-05-10 NOTE — Evaluation (Signed)
Physical Therapy Evaluation Patient Details Name: [name] Sherman MRN: [id number] DOB: 03/20/1941 Today's Date: 05/10/2014   History of Present Illness  73 year old male who  has a past medical history of Essential hypertension, benign; Type 2 diabetes mellitus; Atrial fibrillation; Mixed hyperlipidemia; Pulmonary fibrosis; Pneumonia; Chronic diastolic heart failure; On home O2; and COPD (chronic obstructive pulmonary disease).  Patient was recently discharged from the hospital on 20th July after he was treated for UTI, hyperosmolar nonketotic hyperglycemia. As per patient since he went home he has been feeling weak, and fatigued. [facility] and he was found to be hypotensive with blood pressure 78/48 on EMS arrival. In route the blood pressure was 80/50 and he received 500 normal saline bolus. Patient also was discharged on Cipro for UTI and has been taking as prescribed. Patient was advised not to take lisinopril 20 mg twice a day, due to hypotension but patient was still taking the medication along with metoprolol 25 mg twice a day. He denies fever, denies dysuria urgency or frequency of urination. Patient does have a history of chronic hypoxic respiratory failure due to pulmonary fibrosis and chronic diastolic heart failure and is on oxygen. Patient has noticed that he has been more short of breath than usual. The denies coughing up any phlegm.  Clinical Impression  Pt is a 73 year old male who presents to physical therapy with dx of hypotension.  Pt lives alone in a home with 2 steps to enter front door.  Pt has a basement where washer/dryer are located, though reports his niece has been doing his laundry since he has been sick.  Pt reports he is (I) with ADLs and functional mobility skills.  During evaluation, pt was able to perform bed mobility skills and transfers (I).  Noted drop in O2 stats after both transfers to 88%, while on O2 via [place] at 2L,  requiring VC for breathing techniques and seated rest breaks.  No SOB reported with sitting or standing.   Pt was able to amb ~45 feet without AD while wearing O2 prior to requesting seated rest break secondary to fatigue.  Pt returned to sitting and O2 stats taken at 86%; pt required seated rest break of ~3 minutes for O2 stats to increase to 95%.  Pt educated on importance of breathing techniques (in through nose, out through mouth while wearing [place]), rest breaks as needed with activity, and slowing down gait patterning (as pt was walking fast to "get it over with") as to not increase HR or decrease O2 stats as significantly.  Pt did report MD had informed him he would need to wear the O2 at all times now.  Pt aware during PT evaluation on changes in O2 stats with physical activity and why PT made recommendations to slow down and take breaks as needed.  Recommend continued PT while in the hospital to address strengthening and activity tolerance for improvement of functional mobility skills with transition to HHPT at discharge. No DME recommendations.      Follow Up Recommendations Home health PT    Equipment Recommendations  None recommended by PT       Precautions / Restrictions Precautions Precautions: Fall Restrictions Weight Bearing Restrictions: No      Mobility  Bed Mobility Overal bed mobility: Independent                Transfers Overall transfer level: Independent  Ambulation/Gait Ambulation/Gait assistance: Supervision Ambulation Distance (Feet): 45 Feet Assistive device: None Gait Pattern/deviations: Step-through pattern;Decreased step length - right;Decreased step length - left   Gait velocity interpretation: at or above normal speed for age/gender        Balance Overall balance assessment: No apparent balance deficits (not formally assessed)                                           Pertinent Vitals/Pain No pain  reported.     Home Living Family/patient expects to be discharged to:: Private residence Living Arrangements: Alone Available Help at Discharge: Family;Available PRN/intermittently (Niece lives in [place], sister and cousin live in [place]) Type of Home: House Home Access: Stairs to enter Entrance Stairs-Rails: Right Entrance Stairs-Number of Steps: 2 Home Layout: Laundry or work area in [employer] Equipment: Grab bars - tub/shower;Toilet riser;Shower seat      Prior Function Level of Independence: Independent                  Extremity/Trunk Assessment               Lower Extremity Assessment: Generalized weakness         Communication   Communication: No difficulties  Cognition Arousal/Alertness: Awake/alert Behavior During Therapy: WFL for tasks assessed/performed Overall Cognitive Status: Within Functional Limits for tasks assessed                        Assessment/Plan    PT Assessment Patient needs continued PT services  PT Diagnosis Difficulty walking;Generalized weakness   PT Problem List Decreased strength;Decreased activity tolerance;Decreased mobility  PT Treatment Interventions Balance training;Gait training;Functional mobility training;Therapeutic activities;Therapeutic exercise;Stair training   PT Goals (Current goals can be found in the Care Plan section) Acute Rehab PT Goals Patient Stated Goal: not be SOB PT Goal Formulation: With patient Time For Goal Achievement: 05/24/14 Potential to Achieve Goals: Good    Frequency Min 3X/week   Barriers to discharge Decreased caregiver support         End of Session Equipment Utilized During Treatment: Gait belt;Oxygen (O2 2L) Activity Tolerance: Other (comment) (Limited by SOB) Patient left: in chair;with call bell/phone within reach;with chair alarm set           Time: [phone] PT Time Calculation (min): 37 min   Charges:   PT Evaluation $Initial PT Evaluation  Tier I: 1 Procedure PT Treatments $Self Care/Home Management: 8-22    Cruze Zingaro 05/10/2014, 9:42 AM

## 2014-05-10 NOTE — Progress Notes (Signed)
Pt and his son verbalize understanding of d/c instructions, medications, when to call MD and follow up appts. All questions were answered. No IV access. Pt has home O2 in place, encouraged pt to use continuously as was recommended. Pt has portable tank at bedside. Pt d/c via wheelchair by NT, accompanied by multiple family members. Sheryn BisonGordon, Avy Barlett Warner

## 2014-05-11 LAB — URINE CULTURE

## 2014-05-11 NOTE — Care Management Note (Signed)
    Page 1 of 1   05/11/2014     10:58:08 AM CARE MANAGEMENT NOTE 05/11/2014  Patient:  [name] Sherman   Account Number:  [id number]  Date Initiated:  05/10/2014  Documentation initiated by:  [name] Sherman  Subjective/Objective Assessment:   Pt admitted with weakness, ARF, hypotension, chronic hypoxic resp failure. pt is from home, lives alone, but has family available to assist if needed. He has had a hard time deciding if he will be able to manage at home, or if he wants     Action/Plan:   to go to SNF, and was referred to CSW, but finally decided to go home. He has [place] [facility] active in the home and would like to continue with them.   Anticipated DC Date:  05/10/2014   Anticipated DC Plan:  HOME W HOME HEALTH SERVICES  In-house referral  Clinical Social Worker      DC Associate Professorlanning Services  CM consult      [facility]  [facility]  Resumption Of Svcs/PTA Provider   Choice offered to / List presented to:  C-1 Patient        HH arranged  HH-2 PT      [facility]   Status of service:  Completed, signed off Medicare Important Message given?  YES (If response is "NO", the following Medicare IM given date fields will be blank) Date Medicare IM given:  05/10/2014 Medicare IM given by:  [name] Sherman Date Additional Medicare IM given:   Additional Medicare IM given by:    Discharge Disposition:  HOME W HOME HEALTH SERVICES  Per UR Regulation:  Reviewed for med. necessity/level of care/duration of stay  If discussed at Long Length of Stay Meetings, dates discussed:    Comments:  05/12/14 1000 [name] HendersonGeneva Chrystine Frogge RN/CM

## 2014-05-12 LAB — CULTURE, BLOOD (ROUTINE X 2)
CULTURE: NO GROWTH
Culture: NO GROWTH

## 2014-05-24 ENCOUNTER — Telehealth: Payer: Self-pay | Admitting: Family Medicine

## 2014-05-24 NOTE — Telephone Encounter (Signed)
Urine culture from hospitalization noted, Staph R to Levaquin.  Called and spoke with patient. He is doing well and has no urinary symptoms. In absence of symptoms, will plan on observation. Suspect colonization or spurious result.  Brendia Sacksaniel Charmayne Odell, MD [facility] [phone]

## 2014-06-07 ENCOUNTER — Encounter (HOSPITAL_COMMUNITY): Payer: Self-pay | Admitting: Emergency Medicine

## 2014-06-07 ENCOUNTER — Emergency Department (HOSPITAL_COMMUNITY): Payer: Medicare Other

## 2014-06-07 ENCOUNTER — Inpatient Hospital Stay (HOSPITAL_COMMUNITY)
Admission: EM | Admit: 2014-06-07 | Discharge: 2014-06-17 | DRG: 871 | Disposition: A | Discharge status: Other Acute Inpatient Hospital | Payer: Medicare Other | Attending: Family Medicine | Admitting: Family Medicine

## 2014-06-07 ENCOUNTER — Inpatient Hospital Stay (HOSPITAL_COMMUNITY): Payer: Medicare Other

## 2014-06-07 DIAGNOSIS — Z9981 Dependence on supplemental oxygen: Secondary | ICD-10-CM

## 2014-06-07 DIAGNOSIS — Z79899 Other long term (current) drug therapy: Secondary | ICD-10-CM | POA: Diagnosis not present

## 2014-06-07 DIAGNOSIS — K311 Adult hypertrophic pyloric stenosis: Secondary | ICD-10-CM | POA: Diagnosis present

## 2014-06-07 DIAGNOSIS — I4891 Unspecified atrial fibrillation: Secondary | ICD-10-CM | POA: Diagnosis present

## 2014-06-07 DIAGNOSIS — I509 Heart failure, unspecified: Secondary | ICD-10-CM | POA: Diagnosis present

## 2014-06-07 DIAGNOSIS — K3189 Other diseases of stomach and duodenum: Secondary | ICD-10-CM | POA: Diagnosis present

## 2014-06-07 DIAGNOSIS — E872 Acidosis, unspecified: Secondary | ICD-10-CM | POA: Diagnosis present

## 2014-06-07 DIAGNOSIS — K7689 Other specified diseases of liver: Secondary | ICD-10-CM | POA: Diagnosis present

## 2014-06-07 DIAGNOSIS — Z7982 Long term (current) use of aspirin: Secondary | ICD-10-CM | POA: Diagnosis not present

## 2014-06-07 DIAGNOSIS — J449 Chronic obstructive pulmonary disease, unspecified: Secondary | ICD-10-CM | POA: Diagnosis present

## 2014-06-07 DIAGNOSIS — J8410 Pulmonary fibrosis, unspecified: Secondary | ICD-10-CM | POA: Diagnosis present

## 2014-06-07 DIAGNOSIS — J9620 Acute and chronic respiratory failure, unspecified whether with hypoxia or hypercapnia: Secondary | ICD-10-CM | POA: Diagnosis present

## 2014-06-07 DIAGNOSIS — E1169 Type 2 diabetes mellitus with other specified complication: Secondary | ICD-10-CM | POA: Diagnosis present

## 2014-06-07 DIAGNOSIS — Z7901 Long term (current) use of anticoagulants: Secondary | ICD-10-CM | POA: Diagnosis not present

## 2014-06-07 DIAGNOSIS — K2970 Gastritis, unspecified, without bleeding: Secondary | ICD-10-CM | POA: Diagnosis present

## 2014-06-07 DIAGNOSIS — E875 Hyperkalemia: Secondary | ICD-10-CM | POA: Diagnosis present

## 2014-06-07 DIAGNOSIS — I5033 Acute on chronic diastolic (congestive) heart failure: Secondary | ICD-10-CM | POA: Diagnosis present

## 2014-06-07 DIAGNOSIS — J4489 Other specified chronic obstructive pulmonary disease: Secondary | ICD-10-CM | POA: Diagnosis present

## 2014-06-07 DIAGNOSIS — Z833 Family history of diabetes mellitus: Secondary | ICD-10-CM | POA: Diagnosis not present

## 2014-06-07 DIAGNOSIS — A419 Sepsis, unspecified organism: Secondary | ICD-10-CM | POA: Diagnosis present

## 2014-06-07 DIAGNOSIS — N179 Acute kidney failure, unspecified: Secondary | ICD-10-CM | POA: Diagnosis present

## 2014-06-07 DIAGNOSIS — B3781 Candidal esophagitis: Secondary | ICD-10-CM | POA: Diagnosis present

## 2014-06-07 DIAGNOSIS — K759 Inflammatory liver disease, unspecified: Secondary | ICD-10-CM | POA: Diagnosis present

## 2014-06-07 DIAGNOSIS — Z794 Long term (current) use of insulin: Secondary | ICD-10-CM | POA: Diagnosis not present

## 2014-06-07 DIAGNOSIS — R7401 Elevation of levels of liver transaminase levels: Secondary | ICD-10-CM | POA: Diagnosis present

## 2014-06-07 DIAGNOSIS — E782 Mixed hyperlipidemia: Secondary | ICD-10-CM | POA: Diagnosis present

## 2014-06-07 DIAGNOSIS — K2980 Duodenitis without bleeding: Secondary | ICD-10-CM | POA: Diagnosis present

## 2014-06-07 DIAGNOSIS — R0602 Shortness of breath: Secondary | ICD-10-CM | POA: Diagnosis present

## 2014-06-07 DIAGNOSIS — R1013 Epigastric pain: Secondary | ICD-10-CM

## 2014-06-07 DIAGNOSIS — R6520 Severe sepsis without septic shock: Secondary | ICD-10-CM | POA: Diagnosis present

## 2014-06-07 DIAGNOSIS — R740 Nonspecific elevation of levels of transaminase and lactic acid dehydrogenase [LDH]: Secondary | ICD-10-CM

## 2014-06-07 DIAGNOSIS — I10 Essential (primary) hypertension: Secondary | ICD-10-CM | POA: Diagnosis present

## 2014-06-07 DIAGNOSIS — J189 Pneumonia, unspecified organism: Secondary | ICD-10-CM | POA: Diagnosis present

## 2014-06-07 DIAGNOSIS — K2990 Gastroduodenitis, unspecified, without bleeding: Secondary | ICD-10-CM

## 2014-06-07 DIAGNOSIS — J9601 Acute respiratory failure with hypoxia: Secondary | ICD-10-CM | POA: Diagnosis present

## 2014-06-07 DIAGNOSIS — E119 Type 2 diabetes mellitus without complications: Secondary | ICD-10-CM | POA: Diagnosis present

## 2014-06-07 HISTORY — DX: Heart failure, unspecified: I50.9

## 2014-06-07 LAB — BLOOD GAS, ARTERIAL
ACID-BASE DEFICIT: 9.3 mmol/L — AB (ref 0.0–2.0)
Acid-base deficit: 12.4 mmol/L — ABNORMAL HIGH (ref 0.0–2.0)
BICARBONATE: 13.9 meq/L — AB (ref 20.0–24.0)
Bicarbonate: 16 mEq/L — ABNORMAL LOW (ref 20.0–24.0)
DELIVERY SYSTEMS: POSITIVE
DRAWN BY: 234301
Drawn by: 234301
Expiratory PAP: 6
FIO2: 100 %
FIO2: 100 %
INSPIRATORY PAP: 12
O2 Saturation: 83.2 %
O2 Saturation: 98.3 %
PATIENT TEMPERATURE: 37
Patient temperature: 37
TCO2: 12.8 mmol/L (ref 0–100)
TCO2: 14.6 mmol/L (ref 0–100)
pCO2 arterial: 35.6 mmHg (ref 35.0–45.0)
pCO2 arterial: 34.5 mmHg — ABNORMAL LOW (ref 35.0–45.0)
pH, Arterial: 7.216 — ABNORMAL LOW (ref 7.350–7.450)
pH, Arterial: 7.288 — ABNORMAL LOW (ref 7.350–7.450)
pO2, Arterial: 64.8 mmHg — ABNORMAL LOW (ref 80.0–100.0)
pO2, Arterial: 131 mmHg — ABNORMAL HIGH (ref 80.0–100.0)

## 2014-06-07 LAB — URINALYSIS, ROUTINE W REFLEX MICROSCOPIC
Bilirubin Urine: NEGATIVE
Bilirubin Urine: NEGATIVE
GLUCOSE, UA: NEGATIVE mg/dL
Glucose, UA: NEGATIVE mg/dL
KETONES UR: NEGATIVE mg/dL
Ketones, ur: NEGATIVE mg/dL
Leukocytes, UA: NEGATIVE
Leukocytes, UA: NEGATIVE
Nitrite: NEGATIVE
Nitrite: NEGATIVE
Protein, ur: 30 mg/dL — AB
SPECIFIC GRAVITY, URINE: 1.025 (ref 1.005–1.030)
Specific Gravity, Urine: 1.02 (ref 1.005–1.030)
UROBILINOGEN UA: 0.2 mg/dL (ref 0.0–1.0)
Urobilinogen, UA: 1 mg/dL (ref 0.0–1.0)
pH: 5 (ref 5.0–8.0)
pH: 5 (ref 5.0–8.0)

## 2014-06-07 LAB — CBC
HEMATOCRIT: 50.1 % (ref 39.0–52.0)
Hemoglobin: 15.8 g/dL (ref 13.0–17.0)
MCH: 34.8 pg — AB (ref 26.0–34.0)
MCHC: 31.5 g/dL (ref 30.0–36.0)
MCV: 110.4 fL — ABNORMAL HIGH (ref 78.0–100.0)
Platelets: 161 10*3/uL (ref 150–400)
RBC: 4.54 MIL/uL (ref 4.22–5.81)
RDW: 15.8 % — AB (ref 11.5–15.5)
WBC: 13.9 10*3/uL — ABNORMAL HIGH (ref 4.0–10.5)

## 2014-06-07 LAB — HEPATIC FUNCTION PANEL
ALT: 130 U/L — AB (ref 0–53)
AST: 205 U/L — ABNORMAL HIGH (ref 0–37)
Albumin: 3.7 g/dL (ref 3.5–5.2)
Alkaline Phosphatase: 64 U/L (ref 39–117)
BILIRUBIN DIRECT: 0.9 mg/dL — AB (ref 0.0–0.3)
BILIRUBIN TOTAL: 2.1 mg/dL — AB (ref 0.3–1.2)
Indirect Bilirubin: 1.2 mg/dL — ABNORMAL HIGH (ref 0.3–0.9)
Total Protein: 7.8 g/dL (ref 6.0–8.3)

## 2014-06-07 LAB — CBG MONITORING, ED
Glucose-Capillary: 53 mg/dL — ABNORMAL LOW (ref 70–99)
Glucose-Capillary: 157 mg/dL — ABNORMAL HIGH (ref 70–99)

## 2014-06-07 LAB — URINE MICROSCOPIC-ADD ON

## 2014-06-07 LAB — BASIC METABOLIC PANEL
Anion gap: 28 — ABNORMAL HIGH (ref 5–15)
BUN: 26 mg/dL — AB (ref 6–23)
CALCIUM: 9.7 mg/dL (ref 8.4–10.5)
CO2: 14 meq/L — AB (ref 19–32)
CREATININE: 1.69 mg/dL — AB (ref 0.50–1.35)
Chloride: 96 mEq/L (ref 96–112)
GFR calc Af Amer: 44 mL/min — ABNORMAL LOW (ref >90)
GFR calc non Af Amer: 38 mL/min — ABNORMAL LOW (ref >90)
GLUCOSE: 54 mg/dL — AB (ref 70–99)
Potassium: 5.6 mEq/L — ABNORMAL HIGH (ref 3.7–5.3)
Sodium: 138 mEq/L (ref 137–147)

## 2014-06-07 LAB — GLUCOSE, CAPILLARY
GLUCOSE-CAPILLARY: 170 mg/dL — AB (ref 70–99)
Glucose-Capillary: 171 mg/dL — ABNORMAL HIGH (ref 70–99)

## 2014-06-07 LAB — LACTIC ACID, PLASMA: Lactic Acid, Venous: 11.3 mmol/L — ABNORMAL HIGH (ref 0.5–2.2)

## 2014-06-07 LAB — PROCALCITONIN: Procalcitonin: 14.36 ng/mL

## 2014-06-07 LAB — HEMOGLOBIN A1C
HEMOGLOBIN A1C: 6.3 % — AB (ref <5.7)
Mean Plasma Glucose: 134 mg/dL — ABNORMAL HIGH (ref <117)

## 2014-06-07 LAB — PRO B NATRIURETIC PEPTIDE: Pro B Natriuretic peptide (BNP): 10442 pg/mL — ABNORMAL HIGH (ref 0–450)

## 2014-06-07 LAB — MRSA PCR SCREENING: MRSA BY PCR: NEGATIVE

## 2014-06-07 LAB — TROPONIN I: Troponin I: <0.3 ng/mL (ref <0.30)

## 2014-06-07 MED ORDER — LATANOPROST 0.005 % OP SOLN
1.0000 [drp] | Freq: Every day | OPHTHALMIC | Status: DC
  Administered 2014-06-07 – 2014-06-16 (×10): 1 [drp] via OPHTHALMIC
  Filled 2014-06-07: qty 2.5

## 2014-06-07 MED ORDER — DEXTROSE 50 % IV SOLN
INTRAVENOUS | Status: AC
  Filled 2014-06-07: qty 50

## 2014-06-07 MED ORDER — DEXTROSE 50 % IV SOLN
1.0000 | Freq: Once | INTRAVENOUS | Status: AC
  Administered 2014-06-07: 50 mL via INTRAVENOUS

## 2014-06-07 MED ORDER — FUROSEMIDE 10 MG/ML IJ SOLN
20.0000 mg | Freq: Once | INTRAMUSCULAR | Status: AC
  Administered 2014-06-07: 20 mg via INTRAVENOUS
  Filled 2014-06-07: qty 2

## 2014-06-07 MED ORDER — INSULIN ASPART 100 UNIT/ML ~~LOC~~ SOLN
0.0000 [IU] | SUBCUTANEOUS | Status: DC
  Administered 2014-06-07 – 2014-06-08 (×3): 2 [IU] via SUBCUTANEOUS
  Administered 2014-06-08: 1 [IU] via SUBCUTANEOUS
  Administered 2014-06-08: 3 [IU] via SUBCUTANEOUS
  Administered 2014-06-08 – 2014-06-09 (×2): 2 [IU] via SUBCUTANEOUS
  Administered 2014-06-09: 5 [IU] via SUBCUTANEOUS
  Administered 2014-06-09 (×3): 2 [IU] via SUBCUTANEOUS
  Administered 2014-06-09: 5 [IU] via SUBCUTANEOUS
  Administered 2014-06-10: 1 [IU] via SUBCUTANEOUS
  Administered 2014-06-10: 3 [IU] via SUBCUTANEOUS
  Administered 2014-06-10 (×2): 2 [IU] via SUBCUTANEOUS
  Administered 2014-06-10: 5 [IU] via SUBCUTANEOUS
  Administered 2014-06-10: 3 [IU] via SUBCUTANEOUS
  Administered 2014-06-11: 5 [IU] via SUBCUTANEOUS
  Administered 2014-06-11 (×2): 3 [IU] via SUBCUTANEOUS
  Administered 2014-06-11 (×2): 5 [IU] via SUBCUTANEOUS
  Administered 2014-06-11 – 2014-06-12 (×2): 2 [IU] via SUBCUTANEOUS
  Administered 2014-06-12: 3 [IU] via SUBCUTANEOUS
  Administered 2014-06-12: 2 [IU] via SUBCUTANEOUS
  Administered 2014-06-12 (×2): 5 [IU] via SUBCUTANEOUS
  Administered 2014-06-12: 2 [IU] via SUBCUTANEOUS
  Administered 2014-06-13: 3 [IU] via SUBCUTANEOUS
  Administered 2014-06-13: 2 [IU] via SUBCUTANEOUS
  Administered 2014-06-13 (×2): 5 [IU] via SUBCUTANEOUS
  Administered 2014-06-14: 7 [IU] via SUBCUTANEOUS
  Administered 2014-06-14: 3 [IU] via SUBCUTANEOUS
  Administered 2014-06-14: 5 [IU] via SUBCUTANEOUS
  Administered 2014-06-14: 3 [IU] via SUBCUTANEOUS
  Administered 2014-06-14: 7 [IU] via SUBCUTANEOUS
  Administered 2014-06-15 (×4): 2 [IU] via SUBCUTANEOUS
  Administered 2014-06-15: 5 [IU] via SUBCUTANEOUS
  Administered 2014-06-16: 7 [IU] via SUBCUTANEOUS
  Administered 2014-06-16: 5 [IU] via SUBCUTANEOUS
  Administered 2014-06-16: 2 [IU] via SUBCUTANEOUS
  Administered 2014-06-16: 3 [IU] via SUBCUTANEOUS
  Administered 2014-06-16: 7 [IU] via SUBCUTANEOUS
  Administered 2014-06-16: 1 [IU] via SUBCUTANEOUS
  Administered 2014-06-17: 2 [IU] via SUBCUTANEOUS
  Administered 2014-06-17: 1 [IU] via SUBCUTANEOUS
  Administered 2014-06-17: 2 [IU] via SUBCUTANEOUS
  Administered 2014-06-17: 3 [IU] via SUBCUTANEOUS

## 2014-06-07 MED ORDER — ALUM & MAG HYDROXIDE-SIMETH 200-200-20 MG/5ML PO SUSP: 30.0000 mL | Freq: Four times a day (QID) | ORAL | Status: DC | PRN

## 2014-06-07 MED ORDER — SODIUM CHLORIDE 0.45 % IV SOLN
INTRAVENOUS | Status: DC
  Administered 2014-06-07: 15:00:00 via INTRAVENOUS
  Filled 2014-06-07 (×2): qty 1000

## 2014-06-07 MED ORDER — SENNOSIDES-DOCUSATE SODIUM 8.6-50 MG PO TABS: 1.0000 | ORAL_TABLET | Freq: Every evening | ORAL | Status: DC | PRN

## 2014-06-07 MED ORDER — PNEUMOCOCCAL VAC POLYVALENT 25 MCG/0.5ML IJ INJ
0.5000 mL | INJECTION | INTRAMUSCULAR | Status: AC
  Administered 2014-06-08: 0.5 mL via INTRAMUSCULAR
  Filled 2014-06-07: qty 0.5

## 2014-06-07 MED ORDER — TRAZODONE HCL 50 MG PO TABS: 25.0000 mg | ORAL_TABLET | Freq: Every evening | ORAL | Status: DC | PRN

## 2014-06-07 MED ORDER — APIXABAN 5 MG PO TABS
5.0000 mg | ORAL_TABLET | Freq: Two times a day (BID) | ORAL | Status: DC
  Administered 2014-06-07 – 2014-06-08 (×2): 5 mg via ORAL
  Filled 2014-06-07 (×2): qty 1

## 2014-06-07 MED ORDER — VANCOMYCIN HCL IN DEXTROSE 1-5 GM/200ML-% IV SOLN
1000.0000 mg | Freq: Once | INTRAVENOUS | Status: AC
  Administered 2014-06-07: 1000 mg via INTRAVENOUS
  Filled 2014-06-07: qty 200

## 2014-06-07 MED ORDER — METOPROLOL TARTRATE 25 MG PO TABS
12.5000 mg | ORAL_TABLET | Freq: Two times a day (BID) | ORAL | Status: DC
  Administered 2014-06-07 – 2014-06-17 (×21): 12.5 mg via ORAL
  Filled 2014-06-07 (×22): qty 1

## 2014-06-07 MED ORDER — SODIUM BICARBONATE 8.4 % IV SOLN
INTRAVENOUS | Status: DC
  Administered 2014-06-07 – 2014-06-08 (×2): via INTRAVENOUS
  Filled 2014-06-07 (×4): qty 1000

## 2014-06-07 MED ORDER — CHLORHEXIDINE GLUCONATE 0.12 % MT SOLN
15.0000 mL | Freq: Two times a day (BID) | OROMUCOSAL | Status: DC
  Administered 2014-06-07 – 2014-06-17 (×20): 15 mL via OROMUCOSAL
  Filled 2014-06-07 (×21): qty 15

## 2014-06-07 MED ORDER — FUROSEMIDE 10 MG/ML IJ SOLN: 20.0000 mg | Freq: Two times a day (BID) | INTRAMUSCULAR | Status: DC

## 2014-06-07 MED ORDER — INSULIN ASPART 100 UNIT/ML ~~LOC~~ SOLN: 0.0000 [IU] | Freq: Three times a day (TID) | SUBCUTANEOUS | Status: DC

## 2014-06-07 MED ORDER — CETYLPYRIDINIUM CHLORIDE 0.05 % MT LIQD
7.0000 mL | Freq: Two times a day (BID) | OROMUCOSAL | Status: DC
  Administered 2014-06-07 – 2014-06-17 (×16): 7 mL via OROMUCOSAL

## 2014-06-07 MED ORDER — FLUCONAZOLE 100 MG PO TABS: 100.0000 mg | ORAL_TABLET | Freq: Every day | ORAL | Status: DC

## 2014-06-07 MED ORDER — INSULIN DETEMIR 100 UNIT/ML ~~LOC~~ SOLN
10.0000 [IU] | Freq: Every day | SUBCUTANEOUS | Status: DC
  Filled 2014-06-07: qty 0.1

## 2014-06-07 MED ORDER — VANCOMYCIN HCL IN DEXTROSE 1-5 GM/200ML-% IV SOLN
1000.0000 mg | INTRAVENOUS | Status: DC
  Administered 2014-06-08 – 2014-06-09 (×2): 1000 mg via INTRAVENOUS
  Filled 2014-06-07 (×4): qty 200

## 2014-06-07 MED ORDER — ONDANSETRON HCL 4 MG PO TABS: 4.0000 mg | ORAL_TABLET | Freq: Four times a day (QID) | ORAL | Status: DC | PRN

## 2014-06-07 MED ORDER — PIPERACILLIN-TAZOBACTAM 3.375 G IVPB
3.3750 g | Freq: Three times a day (TID) | INTRAVENOUS | Status: DC
  Administered 2014-06-07 – 2014-06-14 (×22): 3.375 g via INTRAVENOUS
  Filled 2014-06-07 (×24): qty 50

## 2014-06-07 MED ORDER — HYDROCODONE-ACETAMINOPHEN 5-325 MG PO TABS
1.0000 | ORAL_TABLET | ORAL | Status: DC | PRN
  Administered 2014-06-07 – 2014-06-12 (×2): 1 via ORAL
  Filled 2014-06-07 (×2): qty 1
  Filled 2014-06-07: qty 2

## 2014-06-07 MED ORDER — FUROSEMIDE 10 MG/ML IJ SOLN
20.0000 mg | Freq: Two times a day (BID) | INTRAMUSCULAR | Status: DC
Start: 2014-06-07 — End: 2014-06-08
  Administered 2014-06-07 – 2014-06-08 (×2): 20 mg via INTRAVENOUS
  Filled 2014-06-07 (×2): qty 2

## 2014-06-07 MED ORDER — FUROSEMIDE 10 MG/ML IJ SOLN
80.0000 mg | Freq: Once | INTRAMUSCULAR | Status: AC
  Administered 2014-06-07: 80 mg via INTRAVENOUS
  Filled 2014-06-07: qty 8

## 2014-06-07 MED ORDER — SODIUM CHLORIDE 0.9 % IV BOLUS (SEPSIS)
1000.0000 mL | Freq: Once | INTRAVENOUS | Status: AC
  Administered 2014-06-07: 1000 mL via INTRAVENOUS

## 2014-06-07 MED ORDER — ONDANSETRON HCL 4 MG/2ML IJ SOLN: 4.0000 mg | Freq: Four times a day (QID) | INTRAMUSCULAR | Status: DC | PRN

## 2014-06-07 MED ORDER — ACETAMINOPHEN 325 MG PO TABS: 650.0000 mg | ORAL_TABLET | Freq: Four times a day (QID) | ORAL | Status: DC | PRN

## 2014-06-07 MED ORDER — ACETAMINOPHEN 650 MG RE SUPP: 650.0000 mg | Freq: Four times a day (QID) | RECTAL | Status: DC | PRN

## 2014-06-07 MED ORDER — LORAZEPAM 0.5 MG PO TABS
1.0000 mg | ORAL_TABLET | Freq: Once | ORAL | Status: AC
  Administered 2014-06-07: 1 mg via ORAL
  Filled 2014-06-07: qty 2

## 2014-06-07 MED ORDER — ATORVASTATIN CALCIUM 10 MG PO TABS
10.0000 mg | ORAL_TABLET | Freq: Every day | ORAL | Status: DC
  Administered 2014-06-07: 10 mg via ORAL
  Filled 2014-06-07: qty 1

## 2014-06-07 MED ORDER — PIPERACILLIN-TAZOBACTAM 3.375 G IVPB 30 MIN
3.3750 g | Freq: Once | INTRAVENOUS | Status: AC
  Administered 2014-06-07: 3.375 g via INTRAVENOUS
  Filled 2014-06-07: qty 50

## 2014-06-07 MED ORDER — ASPIRIN EC 81 MG PO TBEC
81.0000 mg | DELAYED_RELEASE_TABLET | Freq: Every day | ORAL | Status: DC
  Administered 2014-06-07: 81 mg via ORAL
  Filled 2014-06-07: qty 1

## 2014-06-07 MED ORDER — DEXTROSE-NACL 5-0.9 % IV SOLN
INTRAVENOUS | Status: DC
  Administered 2014-06-07: 11:00:00 via INTRAVENOUS

## 2014-06-07 NOTE — ED Provider Notes (Signed)
CSN: [id number]     Arrival date & time 06/07/14  4098 History  This chart was scribed for [name] Baton, MD by Milly Jakob, ED Scribe. The patient was seen in room APA18/APA18. Patient's care was started at 10:00 AM.    Chief Complaint  Patient presents with  . Shortness of Breath   The history is provided by the patient. No language interpreter was used.   HPI Comments: [name] Sherman is a 73 y.o. male with a history of atrial fibrillation, diastolic CHF, diabetes, pulmonary fibrosis, COPD who presents to the Emergency Department complaining of severe SOB onset suddenly this morning. He reports associated cough and leg swelling. He reports that he had several episodes of diarrhea last night and feeling slightly SOB at that time. Patient reports a chronic nonproductive cough. He denies fever or chest pain. Reports that his lower extremity swelling is slightly worse than baseline.  Level V caveat for acuity of condition  Past Medical History  Diagnosis Date  . Essential hypertension, benign   . Type 2 diabetes mellitus   . Atrial fibrillation     Dr. [name] [facility]  . Mixed hyperlipidemia   . Pulmonary fibrosis     dx 11/2013  . Pneumonia     11/2013  . Chronic diastolic heart failure   . On home O2     2L N/C prn  . COPD (chronic obstructive pulmonary disease)    Past Surgical History  Procedure Laterality Date  . Hernia repair    . Eye surgery     Family History  Problem Relation Age of Onset  . Diabetes Mellitus II Mother   . Diabetes Mellitus II Sister    History  Substance Use Topics  . Smoking status: Never Smoker   . Smokeless tobacco: Not on file  . Alcohol Use: Yes     Comment: Occasional    Review of Systems  Constitutional: Negative.  Negative for fever.  Respiratory: Positive for cough and shortness of breath. Negative for chest tightness.   Cardiovascular: Positive for leg swelling. Negative for chest pain.  Gastrointestinal: Positive for  diarrhea. Negative for nausea, vomiting and abdominal pain.  Genitourinary: Negative.  Negative for dysuria.  Musculoskeletal: Negative for back pain.  Skin: Negative for rash.  Neurological: Negative for headaches.  All other systems reviewed and are negative.     Allergies  Review of patient's allergies indicates no known allergies.  Home Medications   Prior to Admission medications   Medication Sig Start Date End Date Taking? Authorizing Provider  apixaban (ELIQUIS) 5 MG TABS tablet Take 5 mg by mouth 2 (two) times daily.    Historical Provider, MD  aspirin EC 81 MG tablet Take 81 mg by mouth daily.    Historical Provider, MD  atorvastatin (LIPITOR) 10 MG tablet Take 1 tablet (10 mg total) by mouth at bedtime. 05/10/14   Standley Brooking, MD  Bee Pollen 550 MG CAPS Take 1 capsule by mouth at bedtime.    Historical Provider, MD  fluconazole (DIFLUCAN) 100 MG tablet Take 1 tablet (100 mg total) by mouth daily. First dose 7/27. 05/10/14   Standley Brooking, MD  insulin detemir (LEVEMIR) 100 UNIT/ML injection Inject 0.2 mLs (20 Units total) into the skin daily. 05/03/14   Erick Blinks, MD  latanoprost (XALATAN) 0.005 % ophthalmic solution Place 1 drop into the left eye at bedtime.    Historical Provider, MD  levofloxacin (LEVAQUIN) 750 MG tablet Take 1 tablet (  750 mg total) by mouth daily at 6 PM. 05/10/14   Standley Brooking, MD  metFORMIN (GLUCOPHAGE) 500 MG tablet Take 1 tablet (500 mg total) by mouth 2 (two) times daily with a meal. 05/03/14   Erick Blinks, MD  metoprolol (LOPRESSOR) 25 MG tablet Take 0.5 tablets (12.5 mg total) by mouth 2 (two) times daily. 05/09/14   Standley Brooking, MD  Multiple Vitamin (MULTIVITAMIN WITH MINERALS) TABS tablet Take 1 tablet by mouth daily.    Historical Provider, MD  tadalafil (CIALIS) 20 MG tablet Take 20 mg by mouth daily as needed for erectile dysfunction.    Historical Provider, MD   BP 155/77  Pulse 98  Temp(Src) 96.8 F (36 C) (Axillary)   Resp 32  Ht  (1.803 m)  Wt 172 lb 13.5 oz (78.4 kg)  BMI 24.12 kg/m2  SpO2 92% Physical Exam  Nursing note and vitals reviewed. Constitutional: He is oriented to person, place, and time.  Elderly, tachypnea  HENT:  Head: Normocephalic and atraumatic.  Mouth/Throat: Oropharynx is clear and moist.  Eyes: Pupils are equal, round, and reactive to light.  Neck: Neck supple.  Cardiovascular: Normal heart sounds.   No murmur heard. Tachycardia, irregularly irregular rhythm  Pulmonary/Chest: He has no wheezes.  Increased work of breathing, tachypnea, coarse breath sounds bilaterally  Abdominal: Soft. Bowel sounds are normal. There is no tenderness. There is no rebound and no guarding.  Musculoskeletal: He exhibits edema.  1+ bilateral lower extremity edema  Neurological: He is alert and oriented to person, place, and time.  Skin: Skin is warm and dry.  Psychiatric: He has a normal mood and affect.    ED Course  Procedures (including critical care time)  CRITICAL CARE Performed by: Ross Marcus, F   Total critical care time: 60 min  Critical care time was exclusive of separately billable procedures and treating other patients.  Critical care was necessary to treat or prevent imminent or life-threatening deterioration.  Critical care was time spent personally by me on the following activities: development of treatment plan with patient and/or surrogate as well as nursing, discussions with consultants, evaluation of patient's response to treatment, examination of patient, obtaining history from patient or surrogate, ordering and performing treatments and interventions, ordering and review of laboratory studies, ordering and review of radiographic studies, pulse oximetry and re-evaluation of patient's condition.  Labs Review Labs Reviewed  BASIC METABOLIC PANEL - Abnormal; Notable for the following:    Potassium 5.6 (*)    CO2 14 (*)    Glucose, Bld 54 (*)    BUN 26  (*)    Creatinine, Ser 1.69 (*)    GFR calc non Af Amer 38 (*)    GFR calc Af Amer 44 (*)    Anion gap 28 (*)    All other components within normal limits  CBC - Abnormal; Notable for the following:    WBC 13.9 (*)    MCV 110.4 (*)    MCH 34.8 (*)    RDW 15.8 (*)    All other components within normal limits  PRO B NATRIURETIC PEPTIDE - Abnormal; Notable for the following:    Pro B Natriuretic peptide (BNP) 10442.0 (*)    All other components within normal limits  BLOOD GAS, ARTERIAL - Abnormal; Notable for the following:    pH, Arterial 7.216 (*)    pO2, Arterial 64.8 (*)    Bicarbonate 13.9 (*)    Acid-base deficit 12.4 (*)  All other components within normal limits  LACTIC ACID, PLASMA - Abnormal; Notable for the following:    Lactic Acid, Venous 11.3 (*)    All other components within normal limits  HEPATIC FUNCTION PANEL - Abnormal; Notable for the following:    AST 205 (*)    ALT 130 (*)    Total Bilirubin 2.1 (*)    Bilirubin, Direct 0.9 (*)    Indirect Bilirubin 1.2 (*)    All other components within normal limits  BLOOD GAS, ARTERIAL - Abnormal; Notable for the following:    pH, Arterial 7.288 (*)    pCO2 arterial 34.5 (*)    pO2, Arterial 131.0 (*)    Bicarbonate 16.0 (*)    Acid-base deficit 9.3 (*)    All other components within normal limits  URINALYSIS, ROUTINE W REFLEX MICROSCOPIC - Abnormal; Notable for the following:    Hgb urine dipstick SMALL (*)    Protein, ur 30 (*)    All other components within normal limits  URINE MICROSCOPIC-ADD ON - Abnormal; Notable for the following:    Squamous Epithelial / LPF FEW (*)    Bacteria, UA MANY (*)    Casts HYALINE CASTS (*)    All other components within normal limits  CBG MONITORING, ED - Abnormal; Notable for the following:    Glucose-Capillary 53 (*)    All other components within normal limits  CBG MONITORING, ED - Abnormal; Notable for the following:    Glucose-Capillary 157 (*)    All other  components within normal limits  CULTURE, BLOOD (ROUTINE X 2)  CULTURE, BLOOD (ROUTINE X 2)  URINE CULTURE  MRSA PCR SCREENING  CULTURE, EXPECTORATED SPUTUM-ASSESSMENT  TROPONIN I  URINALYSIS, ROUTINE W REFLEX MICROSCOPIC  HEMOGLOBIN A1C    Imaging Review Dg Chest Portable 1 View  06/07/2014   CLINICAL DATA:  Shortness of breath.  EXAM: PORTABLE CHEST - 1 VIEW  COMPARISON:  05/10/2014 and the CT of 12/11/2013  FINDINGS: Midline trachea. Mild cardiomegaly. Pulmonary artery enlargement. Moderate left hemidiaphragm elevation. No left and no definite right pleural effusion. No pneumothorax. Progressive interstitial prominence, especially on the right. Suspicion of right lower lobe airspace disease. Left upper abdominal gas is likely within prominent transverse and splenic flexure colon.  IMPRESSION: Progressive interstitial prominence, especially on the right. Question usual interstitial pneumonia with superimposed interstitial edema or atypical infection. Consider short term radiographic followup.  Cannot exclude right lower lobe concurrent airspace disease/pneumonia.  At minimum, PA and lateral radiographs should be considered. Depending on clinical symptomatology, repeat CT may also be informative.  Pulmonary artery enlargement suggests pulmonary arterial hypertension.   Electronically Signed   By: Jeronimo Greaves M.D.   On: 06/07/2014 10:10     EKG Interpretation   Date/Time:  Monday June 07 2014 09:56:45 EDT Ventricular Rate:  130 PR Interval:    QRS Duration: 102 QT Interval:  367 QTC Calculation: 540 R Axis:   119 Text Interpretation:  Atrial fibrillation RVH with secondary  repolarization abnrm wandering baseline Confirmed by Shogo Larkey  MD, Leslie Jester  ([id number]) on 06/07/2014 10:17:41 AM      MDM   Final diagnoses:  Acute on chronic diastolic congestive heart failure  Hyperkalemia  Transaminitis  Sepsis, due to unspecified organism  Acute respiratory failure with hypoxia     Patient presents with acute shortness of breath. History of pulmonary fibrosis, COPD, CHF, and recurrent pneumonia. Denies any fevers. Patient has increased work of breathing. Sats are 85%. Basic labwork obtained.  Rectal temp is 100.1. Sepsis workup initiated. Patient emergently placed on BiPAP. Initial blood gas prior to BiPAP with acidosis and hypoxia.  Chest x-ray with atypical pneumonia with possible overlying interstitial edema. Patient's last echo showed an EF of 55-60%.  Lactate is 11. Given a normal EF, will fluid resuscitate with 2 L of fluid. Patient also noted to be hyperglycemic on initial evaluation. He was placed on glucose-containing fluids. Patient is much improved on BiPAP. Patient given vancomycin and Zosyn for impaired pneumonia coverage.  Will admit to ICU.  I personally performed the services described in this documentation, which was scribed in my presence. The recorded information has been reviewed and is accurate.   [name] Baton, MD 06/07/14 [phone]

## 2014-06-07 NOTE — Progress Notes (Addendum)
ANTIBIOTIC CONSULT NOTE - INITIAL  Pharmacy Consult for Vancomycin Indication: sepsis  No Known Allergies  Patient Measurements: Height:  (180.3 cm) Weight: 160 lb (72.576 kg) IBW/kg (Calculated) : 75.3  Vital Signs: Temp: 100.1 F (37.8 C) (08/24 1024) Temp src: Rectal (08/24 1024) BP: 133/88 mmHg (08/24 1200) Pulse Rate: 92 (08/24 1130) Intake/Output from previous day:   Intake/Output from this shift:    Labs:  Recent Labs  06/07/14 1008  WBC 13.9*  HGB 15.8  PLT 161  CREATININE 1.69*   Estimated Creatinine Clearance: 38.8 ml/min (by C-G formula based on Cr of 1.69). No results found for this basename: VANCOTROUGH, VANCOPEAK, VANCORANDOM, GENTTROUGH, GENTPEAK, GENTRANDOM, TOBRATROUGH, TOBRAPEAK, TOBRARND, AMIKACINPEAK, AMIKACINTROU, AMIKACIN,  in the last 72 hours   Microbiology: No results found for this or any previous visit (from the past 720 hour(s)).  Medical History: Past Medical History  Diagnosis Date  . Essential hypertension, benign   . Type 2 diabetes mellitus   . Atrial fibrillation     Dr. [name] [facility]  . Mixed hyperlipidemia   . Pulmonary fibrosis     dx 11/2013  . Pneumonia     11/2013  . Chronic diastolic heart failure   . On home O2     2L N/C prn  . COPD (chronic obstructive pulmonary disease)     Medications:  Scheduled:    Assessment: 73 yo M who presents with shortness of breath.  He was hospitalized ~ 1 month with PNA.   He is febrile & tachycardic with elevated WBC and lactic acid level.   Scr is elevated above patient's baseline (~1.1 in 04/2014).    CXR cannot exclude PNA.  Cx data pending.   Vancomycin 8/24>> Zosyn 8/24>>  Goal of Therapy:  Vancomycin trough level 15-20 mcg/ml  Plan:  Vancomycin 1gm IV q24h Check Vancomycin trough at steady state Monitor renal function and cx data  F/U admission orders  Safia Panzer, [facility] 06/07/2014,12:59 PM  Also continuing Zosyn for gram negative coverage.   1.  Zosyn 3.375gm IV Q8h to be infused over 4hrs  Junita Push, PharmD, BCPS 06/07/2014@1 :51 PM

## 2014-06-07 NOTE — ED Notes (Addendum)
Per EMS, pt reports sob since this am. Per EMS, pt was released from hospital for pneumonia x1 month ago. Pt alert. Moderate dyspnea and accessory muscle use noted on arrival. Pt on non-rebreather at time of arrival. Pt reports is on elloquil. CBG 53 en route. En route x2 albuterol admin.

## 2014-06-07 NOTE — Progress Notes (Signed)
eLink Physician-Brief Progress Note Patient Name: [name] Sherman DOB: 12-23-1940 MRN: [id number]   Date of Service  06/07/2014  HPI/Events of Note  Gas exchange marginal, bp and pulse ok  Intake/Output Summary (Last 24 hours) at 06/07/14 2044 Last data filed at 06/07/14 1800  Gross per 24 hour  Intake    455 ml  Output    300 ml  Net    155 ml      eICU Interventions  Lasix 80 mg IV x one     Intervention Category Major Interventions: Respiratory failure - evaluation and management  Sandrea Hughs 06/07/2014, 8:43 PM

## 2014-06-07 NOTE — Progress Notes (Signed)
eLink Physician-Brief Progress Note Patient Name: [name] Sherman DOB: 07-14-1941 MRN: [id number]   Date of Service  06/07/2014  HPI/Events of Note  Acute resp failure/ metabolic acidosis with partial compensation ? chf  Vs sepsis/ discussed with Dr Angelique Blonder Fisher/ on pipap but not pressors/ cxr with bilateral as dz   eICU Interventions  rec hc03 drip but otherwise restrict vol if not needing pressors and do calcitonin protocol.     Intervention Category Major Interventions: Respiratory failure - evaluation and management  Sandrea Hughs 06/07/2014, 5:00 PM

## 2014-06-07 NOTE — ED Notes (Signed)
Resp at bsd with bipap as well.

## 2014-06-07 NOTE — H&P (Signed)
[facility] History and Physical  [name] Sherman RUE:[id number] DOB: [birth year] DOA: 06/07/2014  Referring physician:  PCP: [name], PA-C   Chief Complaint: shortness of breath  HPI: [name] Sherman is a very pleasant 73 y.o. male a past medical history that includes atrial fibrillation, chronic diastolic heart failure, COPD on home oxygen at 2 L, diabetes present to the emergency department with the chief complaint of worsening shortness of breath. Initial evaluation in the emergency department he was found to be septic with elevated lactic acid, tachycardic, febrile, hypoxia, leukocytosis and chest x-ray concerning for pneumonia and pulmonary edema, acute renal failure and acute on chronic respiratory failure with hypoxia.  Patient reports he was doing well at home since his discharge about a month ago until yesterday evening. He found he was mildly short of breath and increased his oxygen flow from 2.0-2.5 L. He awakened this morning with worsening shortness of breath. Associated symptoms include mild nonproductive cough and 2 episodes of diarrhea and slight increase of lower extremity edema. He denies any chest pain palpitations. He does sleep propped up on one pillow and did so last night as well. He denies abdominal pain nausea vomiting fever chills. He denies dysuria hematuria frequency or urgency.   Workup in the emergency department includes an ABG with a pH of 7.28 PCO2 of 35.6 a PO2 of 64.8 bicarbonate 13.9, basic metabolic panel reveals a potassium of 5.6 a CO2 of 14 a BUN of 26 and creatinine of 1.69. Hepatic function panel with an AST of 25 and ALT of 1:30 and a total bilirubin of 2.1. ProBNP 10,442, initial troponin is negative, lactic acid 11.3. Complete blood count with WBC is 13.9 MCV 110.4 serum glucose 54. Chest x-ray reveals progressive interstitial prominence, especially on the right. Question usual interstitial pneumonia with superimposed interstitial edema or  atypical infection. Consider short term radiographic followup. Cannot exclude right lower lobe concurrent airspace disease/pneumonia. EKG with A. fib and RVR and 130 beats a minute  Emergency department he is given 2 L of normal saline one dose of vancomycin and Zosyn per pharmacy and 20 mg of Lasix IV as well as an amp D50. He was also placed on BiPAP.  At the time of my exam he is resting comfortably on BiPAP, alert and oriented his vital signs are stable he denies any pain reports an improvement in his breathing.   Review of Systems:  10 point review of systems completed and all systems are negative except as indicated in the history of present illness.   Past Medical History  Diagnosis Date  . Essential hypertension, benign   . Type 2 diabetes mellitus   . Atrial fibrillation     Dr. [name] [facility]  . Mixed hyperlipidemia   . Pulmonary fibrosis     dx 11/2013  . Pneumonia     11/2013  . Chronic diastolic heart failure   . On home O2     2L N/C prn  . COPD (chronic obstructive pulmonary disease)    Past Surgical History  Procedure Laterality Date  . Hernia repair    . Eye surgery     Social History:  reports that he has never smoked. He does not have any smokeless tobacco history on file. He reports that he drinks alcohol. He reports that he does not use illicit drugs.  No Known Allergies  Family History  Problem Relation Age of Onset  . Diabetes Mellitus II Mother   . Diabetes Mellitus II  Sister      Prior to Admission medications   Medication Sig Start Date End Date Taking? Authorizing Provider  apixaban (ELIQUIS) 5 MG TABS tablet Take 5 mg by mouth 2 (two) times daily.    Historical Provider, MD  aspirin EC 81 MG tablet Take 81 mg by mouth daily.    Historical Provider, MD  atorvastatin (LIPITOR) 10 MG tablet Take 1 tablet (10 mg total) by mouth at bedtime. 05/10/14   Standley Brooking, MD  Bee Pollen 550 MG CAPS Take 1 capsule by mouth at bedtime.    Historical  Provider, MD  fluconazole (DIFLUCAN) 100 MG tablet Take 1 tablet (100 mg total) by mouth daily. First dose 7/27. 05/10/14   Standley Brooking, MD  insulin detemir (LEVEMIR) 100 UNIT/ML injection Inject 0.2 mLs (20 Units total) into the skin daily. 05/03/14   Erick Blinks, MD  latanoprost (XALATAN) 0.005 % ophthalmic solution Place 1 drop into the left eye at bedtime.    Historical Provider, MD  levofloxacin (LEVAQUIN) 750 MG tablet Take 1 tablet (750 mg total) by mouth daily at 6 PM. 05/10/14   Standley Brooking, MD  metFORMIN (GLUCOPHAGE) 500 MG tablet Take 1 tablet (500 mg total) by mouth 2 (two) times daily with a meal. 05/03/14   Erick Blinks, MD  metoprolol (LOPRESSOR) 25 MG tablet Take 0.5 tablets (12.5 mg total) by mouth 2 (two) times daily. 05/09/14   Standley Brooking, MD  Multiple Vitamin (MULTIVITAMIN WITH MINERALS) TABS tablet Take 1 tablet by mouth daily.    Historical Provider, MD  tadalafil (CIALIS) 20 MG tablet Take 20 mg by mouth daily as needed for erectile dysfunction.    Historical Provider, MD   Physical Exam: Filed Vitals:   06/07/14 1025 06/07/14 1130 06/07/14 1200 06/07/14 1235  BP: 126/76 111/89 133/88   Pulse: 112 92    Temp:      TempSrc:      Resp: 33 30 30   Height:      Weight:      SpO2: 97% 100%  94%    Wt Readings from Last 3 Encounters:  06/07/14 72.576 kg (160 lb)  05/08/14 73.7 kg (162 lb 7.7 oz)  05/03/14 75.8 kg (167 lb 1.7 oz)    General:  Appears calm and comfortable Eyes: PERRL, normal lids, irises & conjunctiva.  ENT: grossly normal hearing, lips & tongue. His hemorrhage of his mouth are moist and pink Neck: no LAD, masses or thyromegaly Cardiovascular: RRR, occasional ectopic beat. I hear no murmur no gallop no rub. No lower extremity edema. Pedal pulses are present and palpable Telemetry: SR, no arrhythmias  Respiratory: Mild increased work of breathing with conversation on BiPAP. Sounds are very distant. On crackles in bases. I hear no  wheeze Abdomen: soft, ntnd positive bowel sounds throughout Skin: no rash or induration seen on limited exam Musculoskeletal: grossly normal tone BUE/BLE Psychiatric: grossly normal mood and affect, speech fluent and appropriate Neurologic: grossly non-focal.          Labs on Admission:  Basic Metabolic Panel:  Recent Labs Lab 06/07/14 1008  NA 138  K 5.6*  CL 96  CO2 14*  GLUCOSE 54*  BUN 26*  CREATININE 1.69*  CALCIUM 9.7   Liver Function Tests:  Recent Labs Lab 06/07/14 1118  AST 205*  ALT 130*  ALKPHOS 64  BILITOT 2.1*  PROT 7.8  ALBUMIN 3.7   No results found for this basename: LIPASE, AMYLASE,  in  the last 168 hours No results found for this basename: AMMONIA,  in the last 168 hours CBC:  Recent Labs Lab 06/07/14 1008  WBC 13.9*  HGB 15.8  HCT 50.1  MCV 110.4*  PLT 161   Cardiac Enzymes:  Recent Labs Lab 06/07/14 1008  TROPONINI <0.30    BNP (last 3 results)  Recent Labs  04/02/14 0525 04/30/14 1115 06/07/14 1008  PROBNP 1543.0* 1929.0* 10442.0*   CBG:  Recent Labs Lab 06/07/14 0950 06/07/14 1121  GLUCAP 53* 157*    Radiological Exams on Admission: Dg Chest Portable 1 View  06/07/2014   CLINICAL DATA:  Shortness of breath.  EXAM: PORTABLE CHEST - 1 VIEW  COMPARISON:  05/10/2014 and the CT of 12/11/2013  FINDINGS: Midline trachea. Mild cardiomegaly. Pulmonary artery enlargement. Moderate left hemidiaphragm elevation. No left and no definite right pleural effusion. No pneumothorax. Progressive interstitial prominence, especially on the right. Suspicion of right lower lobe airspace disease. Left upper abdominal gas is likely within prominent transverse and splenic flexure colon.  IMPRESSION: Progressive interstitial prominence, especially on the right. Question usual interstitial pneumonia with superimposed interstitial edema or atypical infection. Consider short term radiographic followup.  Cannot exclude right lower lobe concurrent  airspace disease/pneumonia.  At minimum, PA and lateral radiographs should be considered. Depending on clinical symptomatology, repeat CT may also be informative.  Pulmonary artery enlargement suggests pulmonary arterial hypertension.   Electronically Signed   By: Jeronimo Greaves M.D.   On: 06/07/2014 10:10    EKG: Independently reviewed A. fib with RVR and 130 beats  Assessment/Plan Principal Problem:   Sepsis: Related to healthcare acquired pneumonia. Will admit to ICU. Will obtain blood cultures and sputum cultures. Will provide IV fluids, IV antibiotics. Monitor his lactic acid. At time of admission he is hemodynamically stable.  Active Problems:  HCAP (healthcare-associated pneumonia): Patient recently in hospital with hypotension lactic acidosis and no evidence of sepsis at that time. Chest x-ray as above. Currently with fever and leukocytosis. Will continue Vanco and Zosyn that was started in the emergency department. Will obtain blood cultures and sputum cultures.    Acute respiratory failure with hypoxia: In setting of chronic respiratory failure patient on home oxygen at 2 L a minute. Improved in the emergency department with BiPAP. Will admit to the ICU and monitor closely. Repeat blood gas with a pH of 7.28, PCO2 34.5, PO2 131, bicarbonate 16.0. He appears comfortable on BiPAP. Some concern for decompensating diastolic heart failure as well with elevated proBNP. Given lasix  in ED. Will obtain edecho as well. Monitor daily weight and intake and output.    Acute on chronic diastolic heart failure: see above. Home meds include metoprolol. Will continue this for now with parameters. Last echo yields mild LVH with EF 55-60% and grade 2 diastolic dysfunction. Not on diuretic at home. Monitor intake and output and obtain daily weight.     Acute renal failure: likely related to decreased po intake in setting of #1 and #2. Will hydrate with IV fluids. Monitor urine output and recheck BMET in am.      Hypertension: stable. Will continue home metoprolol with parameters    Atrial fibrillation: with RVR on presentation. Given fluids and BiPAP and rate better controlled at time of my exam. Will continue home metoprolol and eliquis. Monitor closely.      DM type 2 (diabetes mellitus, type 2): hypoglycemic on admission. Likely related to #1. Given one amp D50 and fluids of D5NS. Will provide levimir  at half home dose and use SSI for optimal control. Will obtain A1c.      Metabolic acidosis: related to above. Will give IV fluids of .45NS + 1 amp bicarb. Will monitor closely.     Hyperkalemia: mild. Related to above. Given IV lasix in ED. Will recheck in am.     Elevated transaminase level: likely related to acute illness. monitor   Code Status: full DVT Prophylaxis: Family Communication: none present Disposition Plan: home when ready  Time spent: 65 minutes  [facility] Pager [phone]  **Disclaimer: This note may have been dictated with voice recognition software. Similar sounding words can inadvertently be transcribed and this note may contain transcription errors which may not have been corrected upon publication of note.**

## 2014-06-07 NOTE — Progress Notes (Signed)
PT TO C.T. WHILE REMAINING ON BIPAP. PT REMAINS UNABLE TO TOLERATE BEING OFF BIPAP AT THIS TIME.

## 2014-06-07 NOTE — ED Notes (Signed)
Pt alert. Pt given sips of orange juice. Tolerated. MD at [facility]sd at this time.

## 2014-06-07 NOTE — H&P (Addendum)
The patient was seen and examined. His chart, laboratory studies, and vital signs were reviewed. He was discussed with nurse practitioner, Ms. Vedia Coffer. Agree with her findings with additions below.  Assessment and plan:  1. Sepsis, secondary to healthcare acquired pneumonia. Patient was given 1-1/2 L of IV fluids, Zosyn, and vancomycin in the emergency department. We'll continue  IV fluids, but will be cognizant of the possibility of decompensated diastolic heart failure. 2. Presumed healthcare associated pneumonia. We'll treat with Zosyn and vancomycin, oxygen, and therapies. No obvious bronchospasms/wheezes on exam, so will hold off on scheduled nebulizers. 3. Acute respiratory failure, secondary to hypoxia superimposed on chronic respiratory failure. This is secondary to a combination of pneumonia and possible pulmonary edema/decompensated diastolic heart failure. His ABG on a nonrebreather on admission revealed a pH of 7.2, PCO2 of 36, and PO2 of 65. BiPAP started. PO2 has improved, but pH is still low at 7.3. We will continue BiPAP and oxygen as tolerated. 4. Lactic acidosis. His lactic acid was 11.3 on admission. We will continue vigorous IV fluids with an amp of bicarbonate added per liter. We'll add more bicarbonate to his IV fluids. We'll continue to monitor his CO2 and lactic acid levels. 5. Acute on chronic diastolic heart failure. The patient's proBNP was greater than 10,000 on admission and his chest x-ray may reveal superimposed pulmonary edema versus inflammation. He was given 20 mg of Lasix in the ED. We will continue gentle Lasix at 20 mg IV every 12 hours, but will continue IV fluids as well. His last echocardiogram early 2015 revealed an ejection fraction of 55-60% and grade 2 diastolic dysfunction. We will order another 2-D echocardiogram for further evaluation. 6. Acute renal failure secondary to sepsis, prerenal azotemia, and likely hyperperfusion/hyperkalemia. His BUN was 26 and  his creatinine was 1.69 on admission. In July, his last recorded creatinine was 1.32. His potassium is elevated, likely secondary to acute renal failure. We'll continue IV fluids with sodium bicarbonate added which will help with decreasing his serum potassium. Will continue IV fluid hydration. Foley catheter was placed. We'll monitor his urine output closely. 7. Elevated liver transaminases and hyperbilirubinemia. His liver transaminases were within normal limits, but his total bilirubin was 1.4 in July. The elevation may be secondary to sepsis and pneumonia. Also consider hepatic congestion as etiology. 8. Chronic atrial fibrillation, with rapid ventricular rate. The elevated rate is secondary to sepsis. He is chronically anticoagulated with Eliquis. We'll discontinue aspirin. 9. Type 2 diabetes mellitus, with hypoglycemia. Following dextrose, his followup CBG was 157. Will hold on sliding scale NovoLog until his CBGs are consistently above 150. Continue to monitor.    Will discuss the patient with critical care physician at [facility]. Total ICU time 1 hour 30 minutes

## 2014-06-08 ENCOUNTER — Inpatient Hospital Stay (HOSPITAL_COMMUNITY): Payer: Medicare Other

## 2014-06-08 ENCOUNTER — Encounter (HOSPITAL_COMMUNITY): Payer: Self-pay | Admitting: Gastroenterology

## 2014-06-08 DIAGNOSIS — R1013 Epigastric pain: Secondary | ICD-10-CM

## 2014-06-08 DIAGNOSIS — I369 Nonrheumatic tricuspid valve disorder, unspecified: Secondary | ICD-10-CM

## 2014-06-08 DIAGNOSIS — K3189 Other diseases of stomach and duodenum: Secondary | ICD-10-CM

## 2014-06-08 DIAGNOSIS — A419 Sepsis, unspecified organism: Secondary | ICD-10-CM | POA: Diagnosis not present

## 2014-06-08 LAB — BLOOD GAS, ARTERIAL
Acid-Base Excess: 0.6 mmol/L (ref 0.0–2.0)
BICARBONATE: 24.3 meq/L — AB (ref 20.0–24.0)
DRAWN BY: 38235
Delivery systems: POSITIVE
Expiratory PAP: 5
FIO2: 0.6 %
Inspiratory PAP: 10
LHR: 8 {breaths}/min
O2 Saturation: 97 %
PATIENT TEMPERATURE: 37
PH ART: 7.436 (ref 7.350–7.450)
TCO2: 21.2 mmol/L (ref 0–100)
pCO2 arterial: 36.7 mmHg (ref 35.0–45.0)
pO2, Arterial: 88.9 mmHg (ref 80.0–100.0)

## 2014-06-08 LAB — GLUCOSE, CAPILLARY
GLUCOSE-CAPILLARY: 121 mg/dL — AB (ref 70–99)
Glucose-Capillary: 127 mg/dL — ABNORMAL HIGH (ref 70–99)
Glucose-Capillary: 143 mg/dL — ABNORMAL HIGH (ref 70–99)
Glucose-Capillary: 182 mg/dL — ABNORMAL HIGH (ref 70–99)
Glucose-Capillary: 182 mg/dL — ABNORMAL HIGH (ref 70–99)
Glucose-Capillary: 237 mg/dL — ABNORMAL HIGH (ref 70–99)

## 2014-06-08 LAB — URINE CULTURE
COLONY COUNT: NO GROWTH
Culture: NO GROWTH

## 2014-06-08 LAB — CBC
HCT: 43.4 % (ref 39.0–52.0)
Hemoglobin: 14 g/dL (ref 13.0–17.0)
MCH: 34.2 pg — ABNORMAL HIGH (ref 26.0–34.0)
MCHC: 32.3 g/dL (ref 30.0–36.0)
MCV: 106.1 fL — AB (ref 78.0–100.0)
PLATELETS: 144 10*3/uL — AB (ref 150–400)
RBC: 4.09 MIL/uL — ABNORMAL LOW (ref 4.22–5.81)
RDW: 15.6 % — ABNORMAL HIGH (ref 11.5–15.5)
WBC: 8.4 10*3/uL (ref 4.0–10.5)

## 2014-06-08 LAB — COMPREHENSIVE METABOLIC PANEL
ALT: 1316 U/L — ABNORMAL HIGH (ref 0–53)
AST: 2096 U/L — ABNORMAL HIGH (ref 0–37)
Albumin: 3.2 g/dL — ABNORMAL LOW (ref 3.5–5.2)
Alkaline Phosphatase: 51 U/L (ref 39–117)
Anion gap: 18 — ABNORMAL HIGH (ref 5–15)
BUN: 32 mg/dL — ABNORMAL HIGH (ref 6–23)
CALCIUM: 8.3 mg/dL — AB (ref 8.4–10.5)
CHLORIDE: 98 meq/L (ref 96–112)
CO2: 23 meq/L (ref 19–32)
Creatinine, Ser: 1.8 mg/dL — ABNORMAL HIGH (ref 0.50–1.35)
GFR calc Af Amer: 41 mL/min — ABNORMAL LOW (ref >90)
GFR, EST NON AFRICAN AMERICAN: 35 mL/min — AB (ref >90)
Glucose, Bld: 131 mg/dL — ABNORMAL HIGH (ref 70–99)
Potassium: 5 mEq/L (ref 3.7–5.3)
SODIUM: 139 meq/L (ref 137–147)
Total Bilirubin: 1.9 mg/dL — ABNORMAL HIGH (ref 0.3–1.2)
Total Protein: 6.6 g/dL (ref 6.0–8.3)

## 2014-06-08 LAB — BASIC METABOLIC PANEL
Anion gap: 20 — ABNORMAL HIGH (ref 5–15)
Anion gap: 17 — ABNORMAL HIGH (ref 5–15)
BUN: 31 mg/dL — ABNORMAL HIGH (ref 6–23)
BUN: 32 mg/dL — ABNORMAL HIGH (ref 6–23)
CALCIUM: 8.3 mg/dL — AB (ref 8.4–10.5)
CO2: 19 meq/L (ref 19–32)
CO2: 21 meq/L (ref 19–32)
CREATININE: 1.75 mg/dL — AB (ref 0.50–1.35)
Calcium: 8.5 mg/dL (ref 8.4–10.5)
Chloride: 96 mEq/L (ref 96–112)
Chloride: 96 mEq/L (ref 96–112)
Creatinine, Ser: 1.74 mg/dL — ABNORMAL HIGH (ref 0.50–1.35)
GFR calc Af Amer: 42 mL/min — ABNORMAL LOW (ref >90)
GFR calc Af Amer: 42 mL/min — ABNORMAL LOW (ref >90)
GFR calc non Af Amer: 36 mL/min — ABNORMAL LOW (ref >90)
GFR, EST NON AFRICAN AMERICAN: 37 mL/min — AB (ref >90)
GLUCOSE: 158 mg/dL — AB (ref 70–99)
GLUCOSE: 155 mg/dL — AB (ref 70–99)
POTASSIUM: 6.2 meq/L — AB (ref 3.7–5.3)
Potassium: 5.1 mEq/L (ref 3.7–5.3)
SODIUM: 135 meq/L — AB (ref 137–147)
Sodium: 134 mEq/L — ABNORMAL LOW (ref 137–147)

## 2014-06-08 LAB — PROCALCITONIN: Procalcitonin: 35.99 ng/mL

## 2014-06-08 LAB — LACTIC ACID, PLASMA: Lactic Acid, Venous: 4.8 mmol/L — ABNORMAL HIGH (ref 0.5–2.2)

## 2014-06-08 MED ORDER — ATORVASTATIN CALCIUM 10 MG PO TABS: 10.0000 mg | ORAL_TABLET | Freq: Every day | ORAL | Status: DC

## 2014-06-08 MED ORDER — LORAZEPAM 1 MG PO TABS
1.0000 mg | ORAL_TABLET | Freq: Every evening | ORAL | Status: DC | PRN
  Administered 2014-06-08 – 2014-06-12 (×2): 1 mg via ORAL
  Filled 2014-06-08 (×2): qty 1
  Filled 2014-06-08: qty 2

## 2014-06-08 MED ORDER — PANTOPRAZOLE SODIUM 40 MG PO TBEC: 40.0000 mg | DELAYED_RELEASE_TABLET | Freq: Every day | ORAL | Status: DC

## 2014-06-08 MED ORDER — FAMOTIDINE IN NACL 20-0.9 MG/50ML-% IV SOLN
20.0000 mg | Freq: Every day | INTRAVENOUS | Status: DC
  Administered 2014-06-08: 20 mg via INTRAVENOUS
  Filled 2014-06-08 (×2): qty 50

## 2014-06-08 MED ORDER — SODIUM CHLORIDE 0.9 % IV SOLN
INTRAVENOUS | Status: DC
  Administered 2014-06-08 – 2014-06-09 (×3): via INTRAVENOUS

## 2014-06-08 MED ORDER — METHYLPREDNISOLONE SODIUM SUCC 40 MG IJ SOLR
40.0000 mg | Freq: Four times a day (QID) | INTRAMUSCULAR | Status: DC
  Administered 2014-06-08 – 2014-06-16 (×32): 40 mg via INTRAVENOUS
  Filled 2014-06-08 (×32): qty 1

## 2014-06-08 MED ORDER — PANTOPRAZOLE SODIUM 40 MG PO TBEC
40.0000 mg | DELAYED_RELEASE_TABLET | Freq: Every day | ORAL | Status: DC
  Administered 2014-06-09 – 2014-06-14 (×6): 40 mg via ORAL
  Filled 2014-06-08 (×6): qty 1

## 2014-06-08 MED ORDER — LEVALBUTEROL HCL 0.63 MG/3ML IN NEBU
0.6300 mg | INHALATION_SOLUTION | Freq: Four times a day (QID) | RESPIRATORY_TRACT | Status: DC
  Administered 2014-06-08 – 2014-06-09 (×5): 0.63 mg via RESPIRATORY_TRACT
  Filled 2014-06-08 (×5): qty 3

## 2014-06-08 NOTE — Progress Notes (Signed)
  Echocardiogram 2D Echocardiogram has been performed.  [name] Sherman 06/08/2014, 12:17 PM

## 2014-06-08 NOTE — Progress Notes (Signed)
Pt is attempting to drink clear liquid diet while holding venturie  Mask to face. o2 sat will drop when mask is off.

## 2014-06-08 NOTE — Progress Notes (Signed)
[facility] PROGRESS NOTE  [name] Sherman WUJ:[id number] DOB: 07-21-1941 DOA: 06/07/2014 PCP: [name], PA-C    Code Status: Full code Family Communication: Discussed with his son and niece evening of 8/24. Disposition Plan: To be determined.   Consultants:  Pulmonologist, Dr. Juanetta Gosling  Curbside consultation with intensivist, Sandrea Hughs M.D.  Procedures:  BiPAP  Antibiotics:  Vancomycin 06/07/14>>   Zosyn 06/07/14>>  HPI/Subjective: No decompensation or worsening change in status overnight per nursing. This morning, the patient says he is breathing better. He has no chest pain, chest congestion, or abdominal pain.  Objective: Filed Vitals:   06/08/14 0845  BP: 99/65  Pulse: 79  Temp:   Resp: 20   temperature 97.8. Oxygen saturation on BiPAP and oxygen 97%.  Intake/Output Summary (Last 24 hours) at 06/08/14 9562 Last data filed at 06/08/14 0700  Gross per 24 hour  Intake   1465 ml  Output   2750 ml  Net  -1285 ml   Filed Weights   06/07/14 0951 06/07/14 1342 06/08/14 0515  Weight: 72.576 kg (160 lb) 78.4 kg (172 lb 13.5 oz) 77.7 kg (171 lb 4.8 oz)    Exam:   General:  Pleasant, alert 73 year old African-American man in no acute distress.  Cardiovascular: Irregular, irregular.  Respiratory: Fine diffuse crackles bilaterally. Breathing mildly labored at rest.  Abdomen: Positive bowel sounds, soft, no appreciable distention or rigidity; nontender.  Musculoskeletal: No acute hot red joints. Pedal pulses palpable.  Neurologic: He is alert and oriented x3. His speech is clear.   Data Reviewed: Basic Metabolic Panel:  Recent Labs Lab 06/07/14 1008 06/07/14 1827 06/08/14 0209 06/08/14 0503  NA 138 135* 134* 139  K 5.6* 6.2* 5.1 5.0  CL 96 96 96 98  CO2 14* GLUCOSE 54* 158* 155* 131*  BUN 26* 31* 32* 32*  CREATININE 1.69* 1.74* 1.75* 1.80*  CALCIUM 9.7 8.5 8.3* 8.3*   Liver Function Tests:  Recent Labs Lab  06/07/14 1118 06/08/14 0503  AST 205* 2096*  ALT 130* 1316*  ALKPHOS 64 51  BILITOT 2.1* 1.9*  PROT 7.8 6.6  ALBUMIN 3.7 3.2*   No results found for this basename: LIPASE, AMYLASE,  in the last 168 hours No results found for this basename: AMMONIA,  in the last 168 hours CBC:  Recent Labs Lab 06/07/14 1008 06/08/14 0503  WBC 13.9* 8.4  HGB 15.8 14.0  HCT 50.1 43.4  MCV 110.4* 106.1*  PLT 161 144*   Cardiac Enzymes:  Recent Labs Lab 06/07/14 1008  TROPONINI <0.30   BNP (last 3 results)  Recent Labs  04/02/14 0525 04/30/14 1115 06/07/14 1008  PROBNP 1543.0* 1929.0* 10442.0*   CBG:  Recent Labs Lab 06/07/14 1633 06/07/14 1955 06/08/14 0010 06/08/14 0344 06/08/14 0731  GLUCAP 170* 171* 143* 127* 121*    Recent Results (from the past 240 hour(s))  MRSA PCR SCREENING     Status: None   Collection Time    06/07/14  2:05 PM      Result Value Ref Range Status   MRSA by PCR NEGATIVE  NEGATIVE Final   Comment:            The GeneXpert MRSA Assay (FDA     approved for NASAL specimens     only), is one component of a     comprehensive MRSA colonization     surveillance program. It is not     intended to diagnose MRSA     infection  nor to guide or     monitor treatment for     MRSA infections.     Studies: Ct Abdomen Pelvis Wo Contrast  06/07/2014   CLINICAL DATA:  Acute respiratory failure. Evaluate for pneumonia versus edema.  EXAM: CT CHEST, ABDOMEN AND PELVIS WITHOUT CONTRAST  TECHNIQUE: Multidetector CT imaging of the chest, abdomen and pelvis was performed following the standard protocol without IV contrast.  COMPARISON:  Chest radiograph 06/07/2014 and CT chest without contrast and high-resolution images on 12/11/2013  FINDINGS: CT CHEST FINDINGS  Cardiomegaly with biatrial enlargement appears similar to prior chest CT. Negative for pericardial effusion. Atherosclerotic calcification of the normal caliber thoracic aorta, proximal great vessels, left  anterior descending coronary artery and right coronary artery are stable. Esophagus is unremarkable. A prevascular lymph node is mildly prominent measuring 7.6 mm (previously 7 mm). Precarinal lymphadenopathy has progressed since prior chest CT of 12/11/2013. This prominent lymph node currently measures 2.1 cm AP diameter (previously 1.6 cm). Subcarinal lymph node is now 14 mm AP diameter (previously 10 mm).  There is a trace amount of pleural fluid posteriorly on the left. No pleural effusion on the right.  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 There has been a significant interval change in appearance of the lungs since the prior chest CT of 12/11/2013. There are now extensive multifocal bilateral patchy areas of airspace disease, right greater than left, superimposed on the chronic interstitial lung disease. This manifests as extensive ground-glass opacities bilaterally with focal areas of sparing. Lung apices are fairly spared. There is no intralobular septal thickening to suggest pulmonary edema. The trachea can't mainstem bronchi are patent. Negative for pneumothorax. Lung volumes are slightly low. There is some respiratory motion artifact at the level of the mid chest. This results in an artifactually abnormal appearance of the sternum on the reformatted sagittal views. Thoracic spine vertebral bodies are normal in height and alignment.  Densely sclerotic lesion in the posterior right second rib is stable. Densely sclerotic lesion in the posterior right fifth rib is also stable. The sclerotic lesions are well-circumscribed and appear benign.  CT ABDOMEN AND PELVIS FINDINGS  There is marked gaseous distention of the stomach. Gastric wall thickness appears normal. There is an air-fluid level in the fundus of the stomach and another air-fluid level in the  pylorus/duodenal bulb. Gastric outlet obstruction cannot be excluded.  Small bowel loops and colon are normal in caliber.  The noncontrast appearance of the liver, spleen, pancreas, and kidneys is within normal limits. Negative for hydronephrosis. There is bilateral perinephric stranding, nonspecific finding. Both ureters are normal in caliber. No urinary tract stone disease is seen.  There is bilateral thickening of the adrenal glands without discrete nodules or masses.  There is heavy atherosclerotic calcification of the normal caliber abdominal aorta. Scattered atherosclerotic calcification of the iliac vasculature without aneurysm.  Normal appendix.  There is a amorphous high density within the gallbladder lumen. This was not present on prior chest CT. This could reflect gallbladder sludge and/or small gallstones.  Urinary bladder decompressed by Foley catheter. There is some air within the urinary bladder that is likely secondary to the presence of the Foley. Prostate gland appears within normal limits for size and contains some internal calcifications. Small amount of free fluid is noted in the dependent portion the pelvis. Negative for free intraperitoneal air or lymphadenopathy in the abdomen  or pelvis.  There is a mild convex left in the curvature of the upper to mid lumbar spine and a mild convex right curvature at the L4-L5 level. There is partial fusion of the L4-L5 vertebral bodies along the left lateral aspect, nonsurgical. Significant disc space narrowing at L4-5 and L5-S1. No suspicious osseous lesions.  IMPRESSION: 1. Extensive bilateral airspace disease superimposed on the patient's chronic interstitial lung disease (see high-resolution chest CT performed in February 2015). Findings could reflect an acute exacerbation of usual interstitial pneumonitis or could reflect acute multifocal bilateral infectious pneumonia superimposed on chronic interstitial lung disease. There is a trace left pleural  effusion. 2. Progression of reactive mediastinal lymphadenopathy, likely secondary to the current acute lung pathology. 3. Marked gaseous distention of the stomach. This could place the patient at increased risk for aspiration. Gastric outlet obstruction cannot be completely excluded. Findings were discussed by telephone with the patient's nurse Marchelle Folks, at 7:35 p.m., 06/07/2014. 4. Stable cardiomegaly with biatrial enlargement. 5. Extensive atherosclerosis, including the coronary arteries. 6. Amorphous high density within the gallbladder lumen for which gallbladder sludge and/or small stones cannot be excluded.   Electronically Signed   By: Britta Mccreedy M.D.   On: 06/07/2014 19:44   Ct Chest Wo Contrast  06/07/2014   CLINICAL DATA:  Acute respiratory failure. Evaluate for pneumonia versus edema.  EXAM: CT CHEST, ABDOMEN AND PELVIS WITHOUT CONTRAST  TECHNIQUE: Multidetector CT imaging of the chest, abdomen and pelvis was performed following the standard protocol without IV contrast.  COMPARISON:  Chest radiograph 06/07/2014 and CT chest without contrast and high-resolution images on 12/11/2013  FINDINGS: CT CHEST FINDINGS  Cardiomegaly with biatrial enlargement appears similar to prior chest CT. Negative for pericardial effusion. Atherosclerotic calcification of the normal caliber thoracic aorta, proximal great vessels, left anterior descending coronary artery and right coronary artery are stable. Esophagus is unremarkable. A prevascular lymph node is mildly prominent measuring 7.6 mm (previously 7 mm). Precarinal lymphadenopathy has progressed since prior chest CT of 12/11/2013. This prominent lymph node currently measures 2.1 cm AP diameter (previously 1.6 cm). Subcarinal lymph node is now 14 mm AP diameter (previously 10 mm).  There is a trace amount of pleural fluid posteriorly on the left. No pleural effusion on the right.  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 There has been a significant interval change in appearance of the lungs since the prior chest CT of 12/11/2013. There are now extensive multifocal bilateral patchy areas of airspace disease, right greater than left, superimposed on the chronic interstitial lung disease. This manifests as extensive ground-glass opacities bilaterally with focal areas of sparing. Lung apices are fairly spared. There is no intralobular septal thickening to suggest pulmonary edema. The trachea can't mainstem bronchi are patent. Negative for pneumothorax. Lung volumes are slightly low. There is some respiratory motion artifact at the level of the mid chest. This results in an artifactually abnormal appearance of the sternum on the reformatted sagittal views. Thoracic spine vertebral bodies are normal in height and alignment.  Densely sclerotic lesion in the posterior right second rib is stable. Densely sclerotic lesion in the posterior right fifth rib is also stable. The sclerotic lesions are well-circumscribed and appear benign.  CT ABDOMEN AND PELVIS FINDINGS  There is marked gaseous distention of the stomach. Gastric wall thickness appears normal. There is an air-fluid level in the fundus of the stomach  and another air-fluid level in the pylorus/duodenal bulb. Gastric outlet obstruction cannot be excluded.  Small bowel loops and colon are normal in caliber.  The noncontrast appearance of the liver, spleen, pancreas, and kidneys is within normal limits. Negative for hydronephrosis. There is bilateral perinephric stranding, nonspecific finding. Both ureters are normal in caliber. No urinary tract stone disease is seen.  There is bilateral thickening of the adrenal glands without discrete nodules or masses.  There is heavy atherosclerotic calcification of the normal caliber abdominal aorta. Scattered atherosclerotic calcification of  the iliac vasculature without aneurysm.  Normal appendix.  There is a amorphous high density within the gallbladder lumen. This was not present on prior chest CT. This could reflect gallbladder sludge and/or small gallstones.  Urinary bladder decompressed by Foley catheter. There is some air within the urinary bladder that is likely secondary to the presence of the Foley. Prostate gland appears within normal limits for size and contains some internal calcifications. Small amount of free fluid is noted in the dependent portion the pelvis. Negative for free intraperitoneal air or lymphadenopathy in the abdomen or pelvis.  There is a mild convex left in the curvature of the upper to mid lumbar spine and a mild convex right curvature at the L4-L5 level. There is partial fusion of the L4-L5 vertebral bodies along the left lateral aspect, nonsurgical. Significant disc space narrowing at L4-5 and L5-S1. No suspicious osseous lesions.  IMPRESSION: 1. Extensive bilateral airspace disease superimposed on the patient's chronic interstitial lung disease (see high-resolution chest CT performed in February 2015). Findings could reflect an acute exacerbation of usual interstitial pneumonitis or could reflect acute multifocal bilateral infectious pneumonia superimposed on chronic interstitial lung disease. There is a trace left pleural effusion. 2. Progression of reactive mediastinal lymphadenopathy, likely secondary to the current acute lung pathology. 3. Marked gaseous distention of the stomach. This could place the patient at increased risk for aspiration. Gastric outlet obstruction cannot be completely excluded. Findings were discussed by telephone with the patient's nurse Marchelle Folks, at 7:35 p.m., 06/07/2014. 4. Stable cardiomegaly with biatrial enlargement. 5. Extensive atherosclerosis, including the coronary arteries. 6. Amorphous high density within the gallbladder lumen for which gallbladder sludge and/or small stones cannot  be excluded.   Electronically Signed   By: Britta Mccreedy M.D.   On: 06/07/2014 19:44   Dg Chest Portable 1 View  06/07/2014   CLINICAL DATA:  Shortness of breath.  EXAM: PORTABLE CHEST - 1 VIEW  COMPARISON:  05/10/2014 and the CT of 12/11/2013  FINDINGS: Midline trachea. Mild cardiomegaly. Pulmonary artery enlargement. Moderate left hemidiaphragm elevation. No left and no definite right pleural effusion. No pneumothorax. Progressive interstitial prominence, especially on the right. Suspicion of right lower lobe airspace disease. Left upper abdominal gas is likely within prominent transverse and splenic flexure colon.  IMPRESSION: Progressive interstitial prominence, especially on the right. Question usual interstitial pneumonia with superimposed interstitial edema or atypical infection. Consider short term radiographic followup.  Cannot exclude right lower lobe concurrent airspace disease/pneumonia.  At minimum, PA and lateral radiographs should be considered. Depending on clinical symptomatology, repeat CT may also be informative.  Pulmonary artery enlargement suggests pulmonary arterial hypertension.   Electronically Signed   By: Jeronimo Greaves M.D.   On: 06/07/2014 10:10    Scheduled Meds: . antiseptic oral rinse  7 mL Mouth Rinse q12n4p  . apixaban  5 mg Oral BID  . atorvastatin  10 mg Oral QHS  . chlorhexidine  15 mL Mouth Rinse BID  .  furosemide  20 mg Intravenous BID  . insulin aspart  0-9 Units Subcutaneous 6 times per day  . latanoprost  1 drop Left Eye QHS  . methylPREDNISolone (SOLU-MEDROL) injection  40 mg Intravenous Q6H  . metoprolol tartrate  12.5 mg Oral BID  . piperacillin-tazobactam (ZOSYN)  IV  3.375 g Intravenous Q8H  . pneumococcal 23 valent vaccine  0.5 mL Intramuscular Tomorrow-1000  . vancomycin  1,000 mg Intravenous Q24H   Continuous Infusions: . dextrose 5 % and 0.2 % NaCl 1,000 mL with sodium bicarbonate 100 mEq infusion 70 mL/hr at 06/08/14 0827   Assessment and  plan:  Principal Problem:   Sepsis Active Problems:   Pulmonary fibrosis   HCAP (healthcare-associated pneumonia)   Acute respiratory failure with hypoxia   Gastric distention   Hypertension   Atrial fibrillation   Acute on chronic diastolic heart failure   Acute renal failure   DM type 2 (diabetes mellitus, type 2)   Hyperkalemia   Elevated transaminase level    1. Sepsis with lactic acidosis, presumed to be secondary to healthcare acquired pneumonia and/or aspiration pneumonia. His blood pressure has been consistently above 95-100 overnight. He has not required pressors. He is white blood cell count has improved. He was intermittently hypothermic and mildly febrile, but his temperature is within normal limits. With the bicarbonate infusion, his CO2 has increased and his lactic acid has decreased. Blood cultures and urine culture are pending. Clinically, he appears improved. We will continue Zosyn and vancomycin. We'll add Xopenex. Will discontinue bicarbonate the IV fluids. 2. Possible superimposed pneumonitis versus aspiration pneumonitis.  We'll continue antibiotics as above. We'll add IV Pepcid. 3. Severe pulmonary fibrosis. He is followed by Dr. Juanetta Gosling. Dr. Juanetta Gosling plan to refer the patient to the pulmonary fibrosis clinic at [facility]. He was recently started on a new medication for pulmonary fibrosis, OFEV. The patient had not been feeling well since starting this medication. It will be held. Dr. Juanetta Gosling was consulted and recommended starting gentle steroids. 4. Chronic diastolic heart failure with possible acute diastolic failure. Although his pro BNP was greater than 10,000, his chest x-ray was not particularly indicative of decompensated heart failure. Nevertheless, he was given 20 mg of Lasix in the ED and 80 mg of Lasix overnight by the on-call [facility] intensivist. We will continue IV fluid hydration and small dosing of Lasix as needed. We'll order a followup 2-D  echocardiogram. -Acute respiratory failure with hypoxia. Etiology multifactorial including HC AP/possible aspiration pneumonia/pneumonitis/severe pulmonary edema/and chronic heart failure. We'll continue BiPAP and oxygen as started. Followup ABG improved and reassuring. 5. Query gastric outlet obstruction/marked gaseous distention of the stomach on CT. Surprisingly, the patient has very little abdominal tenderness and no significant clinical distention. OFEV can cause GI perforation, abdominal pain, nausea, and diarrhea. We will consult GI for further evaluation. 6. Elevated LFTs, hepatitis. His AST and ALT have increased to the thousands. Etiology unclear. We'll order a viral hepatitis panel and an abdominal ultrasound. Will consult gastroenterology. 7. Acute renal failure. This is likely secondary to ATN and/or prerenal azotemia from his illness. Status post several doses of Lasix. We'll hold on further dosing of Lasix and continue gentle hydration. 8. Chronic atrial fibrillation His rate is currently controlled. Will continue low dosing of metoprolol. We'll continue anticoagulation with Eliquis. Aspirin discontinued. 9. Diabetes mellitus, type II; hypoglycemic on admission. Status post D50 in the ED. His blood glucose is better. We'll start sliding scale NovoLog with the recent start of IV steroids.Marland Kitchen  We'll continue to monitor.  Time spent: 45 minutes ICU time.    [facility]  [facility] Pager [phone]. If 7PM-7AM, please contact night-coverage at [email or web address], password [facility] 06/08/2014, 9:23 AM  LOS: 1 day

## 2014-06-08 NOTE — Consult Note (Signed)
Referring Provider:Dr. Sherrie Mustache  Primary Care Physician:  Lucius Conn Primary Gastroenterologist:  Dr. Darrick Penna  Date of Admission: 06/07/14 Date of Consultation: 06/08/14  Reason for Consultation:  Marked gaseous distention of stomach, question gastric outlet obstruction  HPI:  [name] Sherman is a 73 year old male with history of pulmonary fibrosis, presenting to the ED in respiratory distress. Sepsis secondary to healthcare acquired pneumonia. Denies abdominal pain, N/V, bloating. Believes he had an EGD and colonoscopy in remote past. Diarrhea prior to presentation to ED but resolved now. No melena or hematochezia. No NSAIDs or aspirin powders. Denies prior history of reflux, dysphagia. On Eliquis with history of afib. Was placed on Ofev as an outpatient for pulmonary fibrosis. Denies any current chest pain, SOB.   CT abd/pelvis with marked gaseous distension of stomach, air-fluid level in fundus of stomach and pylorus/duodenal bulb. Unable to exclude gastric outlet obstruction. Transaminases elevated with AST 205 and ALT 130 on admission; today AST 2096 and ALT 1316. Tbili 1.9.    Past Medical History  Diagnosis Date  . Essential hypertension, benign   . Type 2 diabetes mellitus   . Atrial fibrillation     Dr. [name] [facility]  . Mixed hyperlipidemia   . Pulmonary fibrosis     dx 11/2013  . Pneumonia     11/2013  . Chronic diastolic heart failure   . On home O2     2L N/C prn  . COPD (chronic obstructive pulmonary disease)     Past Surgical History  Procedure Laterality Date  . Hernia repair    . Eye surgery      Prior to Admission medications   Medication Sig Start Date End Date Taking? Authorizing Provider  apixaban (ELIQUIS) 5 MG TABS tablet Take 5 mg by mouth 2 (two) times daily.    Historical Provider, MD  aspirin EC 81 MG tablet Take 81 mg by mouth daily.    Historical Provider, MD  atorvastatin (LIPITOR) 10 MG tablet Take 1 tablet (10 mg total) by  mouth at bedtime. 05/10/14   Standley Brooking, MD  Bee Pollen 550 MG CAPS Take 1 capsule by mouth at bedtime.    Historical Provider, MD  fluconazole (DIFLUCAN) 100 MG tablet Take 1 tablet (100 mg total) by mouth daily. First dose 7/27. 05/10/14   Standley Brooking, MD  insulin detemir (LEVEMIR) 100 UNIT/ML injection Inject 0.2 mLs (20 Units total) into the skin daily. 05/03/14   Erick Blinks, MD  latanoprost (XALATAN) 0.005 % ophthalmic solution Place 1 drop into the left eye at bedtime.    Historical Provider, MD  levofloxacin (LEVAQUIN) 750 MG tablet Take 1 tablet (750 mg total) by mouth daily at 6 PM. 05/10/14   Standley Brooking, MD  metFORMIN (GLUCOPHAGE) 500 MG tablet Take 1 tablet (500 mg total) by mouth 2 (two) times daily with a meal. 05/03/14   Erick Blinks, MD  metoprolol (LOPRESSOR) 25 MG tablet Take 0.5 tablets (12.5 mg total) by mouth 2 (two) times daily. 05/09/14   Standley Brooking, MD  Multiple Vitamin (MULTIVITAMIN WITH MINERALS) TABS tablet Take 1 tablet by mouth daily.    Historical Provider, MD  tadalafil (CIALIS) 20 MG tablet Take 20 mg by mouth daily as needed for erectile dysfunction.    Historical Provider, MD    Current Facility-Administered Medications  Medication Dose Route Frequency Provider Last Rate Last Dose  . 0.9 %  sodium chloride infusion   Intravenous Continuous Elliot Cousin,  MD 135 mL/hr at 06/08/14 1100    . acetaminophen (TYLENOL) tablet 650 mg  650 mg Oral Q6H PRN Gwenyth Bender, NP       Or  . acetaminophen (TYLENOL) suppository 650 mg  650 mg Rectal Q6H PRN Gwenyth Bender, NP      . alum & mag hydroxide-simeth (MAALOX/MYLANTA) 200-200-20 MG/5ML suspension 30 mL  30 mL Oral Q6H PRN Gwenyth Bender, NP      . antiseptic oral rinse (CPC / CETYLPYRIDINIUM CHLORIDE 0.05%) solution 7 mL  7 mL Mouth Rinse q12n4p Elliot Cousin, MD   7 mL at 06/08/14 1200  . apixaban (ELIQUIS) tablet 5 mg  5 mg Oral BID Gwenyth Bender, NP   5 mg at 06/08/14 1004  . [START ON  06/09/2014] atorvastatin (LIPITOR) tablet 10 mg  10 mg Oral QHS Elliot Cousin, MD      . chlorhexidine (PERIDEX) 0.12 % solution 15 mL  15 mL Mouth Rinse BID Elliot Cousin, MD   15 mL at 06/08/14 0751  . famotidine (PEPCID) IVPB 20 mg  20 mg Intravenous Daily Elliot Cousin, MD   20 mg at 06/08/14 1035  . HYDROcodone-acetaminophen (NORCO/VICODIN) 5-325 MG per tablet 1-2 tablet  1-2 tablet Oral Q4H PRN Gwenyth Bender, NP   1 tablet at 06/07/14 1558  . insulin aspart (novoLOG) injection 0-9 Units  0-9 Units Subcutaneous 6 times per day Elliot Cousin, MD   2 Units at 06/08/14 1150  . latanoprost (XALATAN) 0.005 % ophthalmic solution 1 drop  1 drop Left Eye QHS Gwenyth Bender, NP   1 drop at 06/07/14 2204  . levalbuterol (XOPENEX) nebulizer solution 0.63 mg  0.63 mg Nebulization Q6H Elliot Cousin, MD      . methylPREDNISolone sodium succinate (SOLU-MEDROL) 40 mg/mL injection 40 mg  40 mg Intravenous Q6H Fredirick Maudlin, MD   40 mg at 06/08/14 1000  . metoprolol tartrate (LOPRESSOR) tablet 12.5 mg  12.5 mg Oral BID Lesle Chris Black, NP   12.5 mg at 06/08/14 1004  . ondansetron (ZOFRAN) tablet 4 mg  4 mg Oral Q6H PRN Gwenyth Bender, NP       Or  . ondansetron [facility]) injection 4 mg  4 mg Intravenous Q6H PRN Gwenyth Bender, NP      . piperacillin-tazobactam (ZOSYN) IVPB 3.375 g  3.375 g Intravenous Q8H [facility], RPH   3.375 g at 06/08/14 1150  . senna-docusate (Senokot-S) tablet 1 tablet  1 tablet Oral QHS PRN Gwenyth Bender, NP      . traZODone (DESYREL) tablet 25 mg  25 mg Oral QHS PRN Gwenyth Bender, NP      . vancomycin (VANCOCIN) IVPB 1000 mg/200 mL premix  1,000 mg Intravenous Q24H [facility], [facility]        Allergies as of 06/07/2014  . (No Known Allergies)    Family History  Problem Relation Age of Onset  . Diabetes Mellitus II Mother   . Diabetes Mellitus II Sister   . Colon cancer Neg Hx     History   Social History  . Marital Status: Married    Spouse Name: N/A     Number of Children: N/A  . Years of Education: N/A   Occupational History  . Not on file.   Social History Main Topics  . Smoking status: Never Smoker   . Smokeless tobacco: Not on file  . Alcohol Use: Yes     Comment:  Occasional  . Drug Use: No  . Sexual Activity: No   Other Topics Concern  . Not on file   Social History Narrative  . No narrative on file    Review of Systems:   Physical Exam: Vital signs in last 24 hours: Temp:  [96.8 F (36 C)-97.9 F (36.6 C)] 97.2 F (36.2 C) (08/25 1145) Pulse Rate:  [28-126] 87 (08/25 1145) Resp:  [18-47] 22 (08/25 1145) BP: (97-164)/(55-132) 103/67 mmHg (08/25 1145) SpO2:  [71 %-100 %] 92 % (08/25 1145) FiO2 (%):  [50 %-60 %] 50 % (08/25 1135) Weight:  [171 lb 4.8 oz (77.7 kg)-172 lb 13.5 oz (78.4 kg)] 171 lb 4.8 oz (77.7 kg) (08/25 0515) Last BM Date: 06/07/14 General:   Alert,  On Bipap but no distress. Oriented. Head:  Normocephalic and atraumatic. Eyes:  Sclera clear, no icterus.    Ears:  Normal auditory acuity. Nose:  No deformity, discharge,  or lesions. Mouth:  No deformity or lesions, dentition normal. Lungs:  Coarse bilaterally, mild use of accessory muscles Heart:  S1 S2 present, irregularly irregular Abdomen:  Soft, nontender and nondistended. No masses, hepatosplenomegaly or hernias noted. Normal bowel sounds, without guarding, and without rebound.   Rectal:  Deferred  Msk:  Symmetrical without gross deformities. Normal posture. Extremities:  With 1-2+ pedal edema Neurologic:  Alert and  oriented x4;  grossly normal neurologically. Psych:  Alert and cooperative. Normal mood and affect.  Intake/Output from previous day: 08/24 0701 - 08/25 0700 In: 1465 [P.O.:240; I.V.:1125; IV Piggyback:100] Out: 2750 [Urine:2750] Intake/Output this shift: Total I/O In: 640 [P.O.:240; I.V.:400] Out: -   Lab Results:  Recent Labs  06/07/14 1008 06/08/14 0503  WBC 13.9* 8.4  HGB 15.8 14.0  HCT 50.1 43.4  PLT  161 144*   BMET  Recent Labs  06/07/14 1827 06/08/14 0209 06/08/14 0503  NA 135* 134* 139  K 6.2* 5.1 5.0  CL 96 96 98  CO2 19 21 23   GLUCOSE 158* 155* 131*  BUN 31* 32* 32*  CREATININE 1.74* 1.75* 1.80*  CALCIUM 8.5 8.3* 8.3*   LFT  Recent Labs  06/07/14 1118 06/08/14 0503  PROT 7.8 6.6  ALBUMIN 3.7 3.2*  AST 205* 2096*  ALT 130* 1316*  ALKPHOS 64 51  BILITOT 2.1* 1.9*  BILIDIR 0.9*  --   IBILI 1.2*  --     Studies/Results: Ct Abdomen Pelvis Wo Contrast  06/07/2014   CLINICAL DATA:  Acute respiratory failure. Evaluate for pneumonia versus edema.  EXAM: CT CHEST, ABDOMEN AND PELVIS WITHOUT CONTRAST  TECHNIQUE: Multidetector CT imaging of the chest, abdomen and pelvis was performed following the standard protocol without IV contrast.  COMPARISON:  Chest radiograph 06/07/2014 and CT chest without contrast and high-resolution images on 12/11/2013  FINDINGS: CT CHEST FINDINGS  Cardiomegaly with biatrial enlargement appears similar to prior chest CT. Negative for pericardial effusion. Atherosclerotic calcification of the normal caliber thoracic aorta, proximal great vessels, left anterior descending coronary artery and right coronary artery are stable. Esophagus is unremarkable. A prevascular lymph node is mildly prominent measuring 7.6 mm (previously 7 mm). Precarinal lymphadenopathy has progressed since prior chest CT of 12/11/2013. This prominent lymph node currently measures 2.1 cm AP diameter (previously 1.6 cm). Subcarinal lymph node is now 14 mm AP diameter (previously 10 mm).  There is a trace amount of pleural fluid posteriorly on the left. No pleural effusion on the right.  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 There has been a significant interval change in appearance of the lungs since the prior chest CT of 12/11/2013. There  are now extensive multifocal bilateral patchy areas of airspace disease, right greater than left, superimposed on the chronic interstitial lung disease. This manifests as extensive ground-glass opacities bilaterally with focal areas of sparing. Lung apices are fairly spared. There is no intralobular septal thickening to suggest pulmonary edema. The trachea can't mainstem bronchi are patent. Negative for pneumothorax. Lung volumes are slightly low. There is some respiratory motion artifact at the level of the mid chest. This results in an artifactually abnormal appearance of the sternum on the reformatted sagittal views. Thoracic spine vertebral bodies are normal in height and alignment.  Densely sclerotic lesion in the posterior right second rib is stable. Densely sclerotic lesion in the posterior right fifth rib is also stable. The sclerotic lesions are well-circumscribed and appear benign.  CT ABDOMEN AND PELVIS FINDINGS  There is marked gaseous distention of the stomach. Gastric wall thickness appears normal. There is an air-fluid level in the fundus of the stomach and another air-fluid level in the pylorus/duodenal bulb. Gastric outlet obstruction cannot be excluded.  Small bowel loops and colon are normal in caliber.  The noncontrast appearance of the liver, spleen, pancreas, and kidneys is within normal limits. Negative for hydronephrosis. There is bilateral perinephric stranding, nonspecific finding. Both ureters are normal in caliber. No urinary tract stone disease is seen.  There is bilateral thickening of the adrenal glands without discrete nodules or masses.  There is heavy atherosclerotic calcification of the normal caliber abdominal aorta. Scattered atherosclerotic calcification of the iliac vasculature without aneurysm.  Normal appendix.  There is a amorphous high density within the gallbladder lumen. This was not present on prior chest CT. This could reflect gallbladder sludge and/or small gallstones.   Urinary bladder decompressed by Foley catheter. There is some air within the urinary bladder that is likely secondary to the presence of the Foley. Prostate gland appears within normal limits for size and contains some internal calcifications. Small amount of free fluid is noted in the dependent portion the pelvis. Negative for free intraperitoneal air or lymphadenopathy in the abdomen or pelvis.  There is a mild convex left in the curvature of the upper to mid lumbar spine and a mild convex right curvature at the L4-L5 level. There is partial fusion of the L4-L5 vertebral bodies along the left lateral aspect, nonsurgical. Significant disc space narrowing at L4-5 and L5-S1. No suspicious osseous lesions.  IMPRESSION: 1. Extensive bilateral airspace disease superimposed on the patient's chronic interstitial lung disease (see high-resolution chest CT performed in February 2015). Findings could reflect an acute exacerbation of usual interstitial pneumonitis or could reflect acute multifocal bilateral infectious pneumonia superimposed on chronic interstitial lung disease. There is a trace left pleural effusion. 2. Progression of reactive mediastinal lymphadenopathy, likely secondary to the current acute lung pathology. 3. Marked gaseous distention of the stomach. This could place the patient at increased risk for aspiration. Gastric outlet obstruction cannot be completely excluded. Findings were discussed by telephone with the patient's nurse Marchelle Folks, at 7:35 p.m., 06/07/2014. 4. Stable cardiomegaly with biatrial enlargement. 5. Extensive atherosclerosis, including the coronary arteries. 6. Amorphous high density within the gallbladder lumen for which gallbladder sludge and/or small stones cannot be excluded.   Electronically Signed   By: Britta Mccreedy M.D.   On: 06/07/2014 19:44  Ct Chest Wo Contrast  06/07/2014   CLINICAL DATA:  Acute respiratory failure. Evaluate for pneumonia versus edema.  EXAM: CT CHEST,  ABDOMEN AND PELVIS WITHOUT CONTRAST  TECHNIQUE: Multidetector CT imaging of the chest, abdomen and pelvis was performed following the standard protocol without IV contrast.  COMPARISON:  Chest radiograph 06/07/2014 and CT chest without contrast and high-resolution images on 12/11/2013  FINDINGS: CT CHEST FINDINGS  Cardiomegaly with biatrial enlargement appears similar to prior chest CT. Negative for pericardial effusion. Atherosclerotic calcification of the normal caliber thoracic aorta, proximal great vessels, left anterior descending coronary artery and right coronary artery are stable. Esophagus is unremarkable. A prevascular lymph node is mildly prominent measuring 7.6 mm (previously 7 mm). Precarinal lymphadenopathy has progressed since prior chest CT of 12/11/2013. This prominent lymph node currently measures 2.1 cm AP diameter (previously 1.6 cm). Subcarinal lymph node is now 14 mm AP diameter (previously 10 mm).  There is a trace amount of pleural fluid posteriorly on the left. No pleural effusion on the right.  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 There has been a significant interval change in appearance of the lungs since the prior chest CT of 12/11/2013. There are now extensive multifocal bilateral patchy areas of airspace disease, right greater than left, superimposed on the chronic interstitial lung disease. This manifests as extensive ground-glass opacities bilaterally with focal areas of sparing. Lung apices are fairly spared. There is no intralobular septal thickening to suggest pulmonary edema. The trachea can't mainstem bronchi are patent. Negative for pneumothorax. Lung volumes are slightly low. There is some respiratory motion artifact at the level of the mid chest. This results in an artifactually abnormal appearance of the sternum on the  reformatted sagittal views. Thoracic spine vertebral bodies are normal in height and alignment.  Densely sclerotic lesion in the posterior right second rib is stable. Densely sclerotic lesion in the posterior right fifth rib is also stable. The sclerotic lesions are well-circumscribed and appear benign.  CT ABDOMEN AND PELVIS FINDINGS  There is marked gaseous distention of the stomach. Gastric wall thickness appears normal. There is an air-fluid level in the fundus of the stomach and another air-fluid level in the pylorus/duodenal bulb. Gastric outlet obstruction cannot be excluded.  Small bowel loops and colon are normal in caliber.  The noncontrast appearance of the liver, spleen, pancreas, and kidneys is within normal limits. Negative for hydronephrosis. There is bilateral perinephric stranding, nonspecific finding. Both ureters are normal in caliber. No urinary tract stone disease is seen.  There is bilateral thickening of the adrenal glands without discrete nodules or masses.  There is heavy atherosclerotic calcification of the normal caliber abdominal aorta. Scattered atherosclerotic calcification of the iliac vasculature without aneurysm.  Normal appendix.  There is a amorphous high density within the gallbladder lumen. This was not present on prior chest CT. This could reflect gallbladder sludge and/or small gallstones.  Urinary bladder decompressed by Foley catheter. There is some air within the urinary bladder that is likely secondary to the presence of the Foley. Prostate gland appears within normal limits for size and contains some internal calcifications. Small amount of free fluid is noted in the dependent portion the pelvis. Negative for free intraperitoneal air or lymphadenopathy in the abdomen or pelvis.  There is a mild convex left in the curvature of the upper to mid lumbar spine and a mild convex  right curvature at the L4-L5 level. There is partial fusion of the L4-L5 vertebral bodies along the  left lateral aspect, nonsurgical. Significant disc space narrowing at L4-5 and L5-S1. No suspicious osseous lesions.  IMPRESSION: 1. Extensive bilateral airspace disease superimposed on the patient's chronic interstitial lung disease (see high-resolution chest CT performed in February 2015). Findings could reflect an acute exacerbation of usual interstitial pneumonitis or could reflect acute multifocal bilateral infectious pneumonia superimposed on chronic interstitial lung disease. There is a trace left pleural effusion. 2. Progression of reactive mediastinal lymphadenopathy, likely secondary to the current acute lung pathology. 3. Marked gaseous distention of the stomach. This could place the patient at increased risk for aspiration. Gastric outlet obstruction cannot be completely excluded. Findings were discussed by telephone with the patient's nurse Marchelle Folks, at 7:35 p.m., 06/07/2014. 4. Stable cardiomegaly with biatrial enlargement. 5. Extensive atherosclerosis, including the coronary arteries. 6. Amorphous high density within the gallbladder lumen for which gallbladder sludge and/or small stones cannot be excluded.   Electronically Signed   By: Britta Mccreedy M.D.   On: 06/07/2014 19:44   Dg Chest Portable 1 View  06/07/2014   CLINICAL DATA:  Shortness of breath.  EXAM: PORTABLE CHEST - 1 VIEW  COMPARISON:  05/10/2014 and the CT of 12/11/2013  FINDINGS: Midline trachea. Mild cardiomegaly. Pulmonary artery enlargement. Moderate left hemidiaphragm elevation. No left and no definite right pleural effusion. No pneumothorax. Progressive interstitial prominence, especially on the right. Suspicion of right lower lobe airspace disease. Left upper abdominal gas is likely within prominent transverse and splenic flexure colon.  IMPRESSION: Progressive interstitial prominence, especially on the right. Question usual interstitial pneumonia with superimposed interstitial edema or atypical infection. Consider short term  radiographic followup.  Cannot exclude right lower lobe concurrent airspace disease/pneumonia.  At minimum, PA and lateral radiographs should be considered. Depending on clinical symptomatology, repeat CT may also be informative.  Pulmonary artery enlargement suggests pulmonary arterial hypertension.   Electronically Signed   By: Jeronimo Greaves M.D.   On: 06/07/2014 10:10    Impression: 73 year old male admitted with sepsis secondary to healthcare acquired pneumonia with concern for aspiration pneumonia in the setting of severe pulmonary fibrosis; found to have marked gaseous distension of the stomach on CT but asymptomatic. Increased risk for aspiration. Recently on Ofev as outpatient for pulmonary fibrosis, which has several adverse GI side effects and can also cause significant elevations in transaminases. Currently on Eliquis with history of afib.   Needs NG tube for decompression. Eliquis will need to be held for 48 hours prior to any endoscopic procedure. Respiratory status would need to be improved/stable prior to endoscopic procedure.  Elevated LFTs: transaminases bumped to thousands. History of fatty liver with US abdomen in Jan 2015 noting diffuse hepatic steatosis. Ofev known to cause elevated transaminases; this has been held. Proceed with viral markers, [place] of abdomen as planned today, check INR.   Plan: NG tube now for decompression Check viral markers, INR, follow LFTs [place] of abdomen as planned Add PPI Next dose of Eliquis this evening: discuss with attending holding Further recommendations to follow.  Nira Retort, ANP-BC [facility]      LOS: 1 day    06/08/2014, 12:22 PM

## 2014-06-08 NOTE — Consult Note (Signed)
Consult requested by: Dr. Sherrie Mustache Consult requested for respiratory failure:  HPI: This is a 73 year old who has known pulmonary fibrosis. He had been in his usual state of fairly poor health and says that he developed increasing problems after he started new medication which is OFEV. He had fairly nonspecific symptoms from that. He said something about he thought he was on steroids but if so I believe that was started by someone else. He was being set up for evaluation at the [facility]. When he came to the emergency department he was in respiratory distress and was placed on BiPAP. He appeared to be septic with metabolic acidemia and elevated lactate and elevated pro calcitonin. CT showed multilobar infiltrates superimposed on his pulmonary fibrosis. He also had a very distended stomach with a very large gas bubble.  Past Medical History  Diagnosis Date  . Essential hypertension, benign   . Type 2 diabetes mellitus   . Atrial fibrillation     Dr. [name] [facility]  . Mixed hyperlipidemia   . Pulmonary fibrosis     dx 11/2013  . Pneumonia     11/2013  . Chronic diastolic heart failure   . On home O2     2L N/C prn  . COPD (chronic obstructive pulmonary disease)      Family History  Problem Relation Age of Onset  . Diabetes Mellitus II Mother   . Diabetes Mellitus II Sister      History   Social History  . Marital Status: Married    Spouse Name: N/A    Number of Children: N/A  . Years of Education: N/A   Social History Main Topics  . Smoking status: Never Smoker   . Smokeless tobacco: None  . Alcohol Use: Yes     Comment: Occasional  . Drug Use: No  . Sexual Activity: No   Other Topics Concern  . None   Social History Narrative  . None     ROS: Is not clear that he vomited. However he does have significant abdominal discomfort. No HEENT symptoms. He has been coughing a little bit.    Objective: Vital signs in last 24 hours: Temp:  [96.8 F  (36 C)-100.1 F (37.8 C)] 97.8 F (36.6 C) (08/25 0730) Pulse Rate:  [28-159] 84 (08/25 0812) Resp:  [18-47] 33 (08/25 0812) BP: (98-164)/(55-132) 105/74 mmHg (08/25 0812) SpO2:  [85 %-100 %] 94 % (08/25 0812) FiO2 (%):  [60 %-100 %] 60 % (08/24 1342) Weight:  [72.576 kg (160 lb)-78.4 kg (172 lb 13.5 oz)] 77.7 kg (171 lb 4.8 oz) (08/25 0515) Weight change:     Intake/Output from previous day: 08/24 0701 - 08/25 0700 In: 1465 [P.O.:240; I.V.:1125; IV Piggyback:100] Out: 2750 [Urine:2750]  PHYSICAL EXAM He is awake and alert he is on BiPAP. He looks fairly comfortable. His HEENT exam is unremarkable. Nose and throat are clear. His neck is supple. His chest shows rhonchi and wheezing bilaterally. Heart is irregular without gallop. His abdomen is soft without masses. He has 1+ edema of the extremities. Central nervous system exam is grossly intact  Lab Results: Basic Metabolic Panel:  Recent Labs  09/81/19 0209 06/08/14 0503  NA 134* 139  K 5.1 5.0  CL 96 98  CO2 21 23  GLUCOSE 155* 131*  BUN 32* 32*  CREATININE 1.75* 1.80*  CALCIUM 8.3* 8.3*   Liver Function Tests:  Recent Labs  06/07/14 1118 06/08/14 0503  AST 205* 2096*  ALT  130* 1316*  ALKPHOS 64 51  BILITOT 2.1* 1.9*  PROT 7.8 6.6  ALBUMIN 3.7 3.2*   No results found for this basename: LIPASE, AMYLASE,  in the last 72 hours No results found for this basename: AMMONIA,  in the last 72 hours CBC:  Recent Labs  06/07/14 1008 06/08/14 0503  WBC 13.9* 8.4  HGB 15.8 14.0  HCT 50.1 43.4  MCV 110.4* 106.1*  PLT 161 144*   Cardiac Enzymes:  Recent Labs  06/07/14 1008  TROPONINI <0.30   BNP:  Recent Labs  06/07/14 1008  PROBNP 10442.0*   D-Dimer: No results found for this basename: DDIMER,  in the last 72 hours CBG:  Recent Labs  06/07/14 1121 06/07/14 1633 06/07/14 1955 06/08/14 0010 06/08/14 0344 06/08/14 0731  GLUCAP 157* 170* 171* 143* 127* 121*   Hemoglobin A1C:  Recent Labs   06/07/14 0957  HGBA1C 6.3*   Fasting Lipid Panel: No results found for this basename: CHOL, HDL, LDLCALC, TRIG, CHOLHDL, LDLDIRECT,  in the last 72 hours Thyroid Function Tests: No results found for this basename: TSH, T4TOTAL, FREET4, T3FREE, THYROIDAB,  in the last 72 hours Anemia Panel: No results found for this basename: VITAMINB12, FOLATE, FERRITIN, TIBC, IRON, RETICCTPCT,  in the last 72 hours Coagulation: No results found for this basename: LABPROT, INR,  in the last 72 hours Urine Drug Screen: Drugs of Abuse  No results found for this basename: labopia, cocainscrnur, labbenz, amphetmu, thcu, labbarb    Alcohol Level: No results found for this basename: ETH,  in the last 72 hours Urinalysis:  Recent Labs  06/07/14 1230 06/07/14 1405  COLORURINE YELLOW YELLOW  LABSPEC 1.025 1.020  PHURINE 5.0 5.0  GLUCOSEU NEGATIVE NEGATIVE  HGBUR SMALL* MODERATE*  BILIRUBINUR NEGATIVE NEGATIVE  KETONESUR NEGATIVE NEGATIVE  PROTEINUR 30* TRACE*  UROBILINOGEN 1.0 0.2  NITRITE NEGATIVE NEGATIVE  LEUKOCYTESUR NEGATIVE NEGATIVE   Misc. Labs:   ABGS:  Recent Labs  06/08/14 0540  PHART 7.436  PO2ART 88.9  TCO2 21.2  HCO3 24.3*     MICROBIOLOGY: Recent Results (from the past 240 hour(s))  MRSA PCR SCREENING     Status: None   Collection Time    06/07/14  2:05 PM      Result Value Ref Range Status   MRSA by PCR NEGATIVE  NEGATIVE Final   Comment:            The GeneXpert MRSA Assay (FDA     approved for NASAL specimens     only), is one component of a     comprehensive MRSA colonization     surveillance program. It is not     intended to diagnose MRSA     infection nor to guide or     monitor treatment for     MRSA infections.    Studies/Results: Ct Abdomen Pelvis Wo Contrast  06/07/2014   CLINICAL DATA:  Acute respiratory failure. Evaluate for pneumonia versus edema.  EXAM: CT CHEST, ABDOMEN AND PELVIS WITHOUT CONTRAST  TECHNIQUE: Multidetector CT imaging of the  chest, abdomen and pelvis was performed following the standard protocol without IV contrast.  COMPARISON:  Chest radiograph 06/07/2014 and CT chest without contrast and high-resolution images on 12/11/2013  FINDINGS: CT CHEST FINDINGS  Cardiomegaly with biatrial enlargement appears similar to prior chest CT. Negative for pericardial effusion. Atherosclerotic calcification of the normal caliber thoracic aorta, proximal great vessels, left anterior descending coronary artery and right coronary artery are stable. Esophagus is unremarkable. A  prevascular lymph node is mildly prominent measuring 7.6 mm (previously 7 mm). Precarinal lymphadenopathy has progressed since prior chest CT of 12/11/2013. This prominent lymph node currently measures 2.1 cm AP diameter (previously 1.6 cm). Subcarinal lymph node is now 14 mm AP diameter (previously 10 mm).  There is a trace amount of pleural fluid posteriorly on the left. No pleural effusion on the right.  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 There has been a significant interval change in appearance of the lungs since the prior chest CT of 12/11/2013. There are now extensive multifocal bilateral patchy areas of airspace disease, right greater than left, superimposed on the chronic interstitial lung disease. This manifests as extensive ground-glass opacities bilaterally with focal areas of sparing. Lung apices are fairly spared. There is no intralobular septal thickening to suggest pulmonary edema. The trachea can't mainstem bronchi are patent. Negative for pneumothorax. Lung volumes are slightly low. There is some respiratory motion artifact at the level of the mid chest. This results in an artifactually abnormal appearance of the sternum on the reformatted sagittal views. Thoracic spine vertebral bodies are normal in height and  alignment.  Densely sclerotic lesion in the posterior right second rib is stable. Densely sclerotic lesion in the posterior right fifth rib is also stable. The sclerotic lesions are well-circumscribed and appear benign.  CT ABDOMEN AND PELVIS FINDINGS  There is marked gaseous distention of the stomach. Gastric wall thickness appears normal. There is an air-fluid level in the fundus of the stomach and another air-fluid level in the pylorus/duodenal bulb. Gastric outlet obstruction cannot be excluded.  Small bowel loops and colon are normal in caliber.  The noncontrast appearance of the liver, spleen, pancreas, and kidneys is within normal limits. Negative for hydronephrosis. There is bilateral perinephric stranding, nonspecific finding. Both ureters are normal in caliber. No urinary tract stone disease is seen.  There is bilateral thickening of the adrenal glands without discrete nodules or masses.  There is heavy atherosclerotic calcification of the normal caliber abdominal aorta. Scattered atherosclerotic calcification of the iliac vasculature without aneurysm.  Normal appendix.  There is a amorphous high density within the gallbladder lumen. This was not present on prior chest CT. This could reflect gallbladder sludge and/or small gallstones.  Urinary bladder decompressed by Foley catheter. There is some air within the urinary bladder that is likely secondary to the presence of the Foley. Prostate gland appears within normal limits for size and contains some internal calcifications. Small amount of free fluid is noted in the dependent portion the pelvis. Negative for free intraperitoneal air or lymphadenopathy in the abdomen or pelvis.  There is a mild convex left in the curvature of the upper to mid lumbar spine and a mild convex right curvature at the L4-L5 level. There is partial fusion of the L4-L5 vertebral bodies along the left lateral aspect, nonsurgical. Significant disc space narrowing at L4-5 and L5-S1.  No suspicious osseous lesions.  IMPRESSION: 1. Extensive bilateral airspace disease superimposed on the patient's chronic interstitial lung disease (see high-resolution chest CT performed in February 2015). Findings could reflect an acute exacerbation of usual interstitial pneumonitis or could reflect acute multifocal bilateral infectious pneumonia superimposed on chronic interstitial lung disease. There is a trace left pleural effusion. 2. Progression of reactive mediastinal lymphadenopathy, likely secondary to the current acute lung pathology. 3. Marked gaseous distention of the stomach. This could  place the patient at increased risk for aspiration. Gastric outlet obstruction cannot be completely excluded. Findings were discussed by telephone with the patient's nurse Marchelle Folks, at 7:35 p.m., 06/07/2014. 4. Stable cardiomegaly with biatrial enlargement. 5. Extensive atherosclerosis, including the coronary arteries. 6. Amorphous high density within the gallbladder lumen for which gallbladder sludge and/or small stones cannot be excluded.   Electronically Signed   By: Britta Mccreedy M.D.   On: 06/07/2014 19:44   Ct Chest Wo Contrast  06/07/2014   CLINICAL DATA:  Acute respiratory failure. Evaluate for pneumonia versus edema.  EXAM: CT CHEST, ABDOMEN AND PELVIS WITHOUT CONTRAST  TECHNIQUE: Multidetector CT imaging of the chest, abdomen and pelvis was performed following the standard protocol without IV contrast.  COMPARISON:  Chest radiograph 06/07/2014 and CT chest without contrast and high-resolution images on 12/11/2013  FINDINGS: CT CHEST FINDINGS  Cardiomegaly with biatrial enlargement appears similar to prior chest CT. Negative for pericardial effusion. Atherosclerotic calcification of the normal caliber thoracic aorta, proximal great vessels, left anterior descending coronary artery and right coronary artery are stable. Esophagus is unremarkable. A prevascular lymph node is mildly prominent measuring 7.6 mm  (previously 7 mm). Precarinal lymphadenopathy has progressed since prior chest CT of 12/11/2013. This prominent lymph node currently measures 2.1 cm AP diameter (previously 1.6 cm). Subcarinal lymph node is now 14 mm AP diameter (previously 10 mm).  There is a trace amount of pleural fluid posteriorly on the left. No pleural effusion on the right.  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 There has been a significant interval change in appearance of the lungs since the prior chest CT of 12/11/2013. There are now extensive multifocal bilateral patchy areas of airspace disease, right greater than left, superimposed on the chronic interstitial lung disease. This manifests as extensive ground-glass opacities bilaterally with focal areas of sparing. Lung apices are fairly spared. There is no intralobular septal thickening to suggest pulmonary edema. The trachea can't mainstem bronchi are patent. Negative for pneumothorax. Lung volumes are slightly low. There is some respiratory motion artifact at the level of the mid chest. This results in an artifactually abnormal appearance of the sternum on the reformatted sagittal views. Thoracic spine vertebral bodies are normal in height and alignment.  Densely sclerotic lesion in the posterior right second rib is stable. Densely sclerotic lesion in the posterior right fifth rib is also stable. The sclerotic lesions are well-circumscribed and appear benign.  CT ABDOMEN AND PELVIS FINDINGS  There is marked gaseous distention of the stomach. Gastric wall thickness appears normal. There is an air-fluid level in the fundus of the stomach and another air-fluid level in the pylorus/duodenal bulb. Gastric outlet obstruction cannot be excluded.  Small bowel loops and colon are normal in caliber.  The noncontrast appearance of the liver, spleen,  pancreas, and kidneys is within normal limits. Negative for hydronephrosis. There is bilateral perinephric stranding, nonspecific finding. Both ureters are normal in caliber. No urinary tract stone disease is seen.  There is bilateral thickening of the adrenal glands without discrete nodules or masses.  There is heavy atherosclerotic calcification of the normal caliber abdominal aorta. Scattered atherosclerotic calcification of the iliac vasculature without aneurysm.  Normal appendix.  There is a amorphous high density within the gallbladder lumen. This was not present on prior chest CT. This could reflect gallbladder sludge and/or small gallstones.  Urinary bladder decompressed by Foley  catheter. There is some air within the urinary bladder that is likely secondary to the presence of the Foley. Prostate gland appears within normal limits for size and contains some internal calcifications. Small amount of free fluid is noted in the dependent portion the pelvis. Negative for free intraperitoneal air or lymphadenopathy in the abdomen or pelvis.  There is a mild convex left in the curvature of the upper to mid lumbar spine and a mild convex right curvature at the L4-L5 level. There is partial fusion of the L4-L5 vertebral bodies along the left lateral aspect, nonsurgical. Significant disc space narrowing at L4-5 and L5-S1. No suspicious osseous lesions.  IMPRESSION: 1. Extensive bilateral airspace disease superimposed on the patient's chronic interstitial lung disease (see high-resolution chest CT performed in February 2015). Findings could reflect an acute exacerbation of usual interstitial pneumonitis or could reflect acute multifocal bilateral infectious pneumonia superimposed on chronic interstitial lung disease. There is a trace left pleural effusion. 2. Progression of reactive mediastinal lymphadenopathy, likely secondary to the current acute lung pathology. 3. Marked gaseous distention of the stomach. This could  place the patient at increased risk for aspiration. Gastric outlet obstruction cannot be completely excluded. Findings were discussed by telephone with the patient's nurse Marchelle Folks, at 7:35 p.m., 06/07/2014. 4. Stable cardiomegaly with biatrial enlargement. 5. Extensive atherosclerosis, including the coronary arteries. 6. Amorphous high density within the gallbladder lumen for which gallbladder sludge and/or small stones cannot be excluded.   Electronically Signed   By: Britta Mccreedy M.D.   On: 06/07/2014 19:44   Dg Chest Portable 1 View  06/07/2014   CLINICAL DATA:  Shortness of breath.  EXAM: PORTABLE CHEST - 1 VIEW  COMPARISON:  05/10/2014 and the CT of 12/11/2013  FINDINGS: Midline trachea. Mild cardiomegaly. Pulmonary artery enlargement. Moderate left hemidiaphragm elevation. No left and no definite right pleural effusion. No pneumothorax. Progressive interstitial prominence, especially on the right. Suspicion of right lower lobe airspace disease. Left upper abdominal gas is likely within prominent transverse and splenic flexure colon.  IMPRESSION: Progressive interstitial prominence, especially on the right. Question usual interstitial pneumonia with superimposed interstitial edema or atypical infection. Consider short term radiographic followup.  Cannot exclude right lower lobe concurrent airspace disease/pneumonia.  At minimum, PA and lateral radiographs should be considered. Depending on clinical symptomatology, repeat CT may also be informative.  Pulmonary artery enlargement suggests pulmonary arterial hypertension.   Electronically Signed   By: Jeronimo Greaves M.D.   On: 06/07/2014 10:10    Medications:  Prior to Admission:  Prescriptions prior to admission  Medication Sig Dispense Refill  . apixaban (ELIQUIS) 5 MG TABS tablet Take 5 mg by mouth 2 (two) times daily.      Marland Kitchen aspirin EC 81 MG tablet Take 81 mg by mouth daily.      Marland Kitchen atorvastatin (LIPITOR) 10 MG tablet Take 1 tablet (10 mg total) by  mouth at bedtime.      Alphonsus Sias Pollen 550 MG CAPS Take 1 capsule by mouth at bedtime.      . fluconazole (DIFLUCAN) 100 MG tablet Take 1 tablet (100 mg total) by mouth daily. First dose 7/27.  3 tablet  0  . insulin detemir (LEVEMIR) 100 UNIT/ML injection Inject 0.2 mLs (20 Units total) into the skin daily.  10 mL  11  . latanoprost (XALATAN) 0.005 % ophthalmic solution Place 1 drop into the left eye at bedtime.      Marland Kitchen levofloxacin (LEVAQUIN) 750 MG tablet Take 1 tablet (  750 mg total) by mouth daily at 6 PM.  3 tablet  0  . metFORMIN (GLUCOPHAGE) 500 MG tablet Take 1 tablet (500 mg total) by mouth 2 (two) times daily with a meal.  60 tablet  1  . metoprolol (LOPRESSOR) 25 MG tablet Take 0.5 tablets (12.5 mg total) by mouth 2 (two) times daily.  60 tablet  0  . Multiple Vitamin (MULTIVITAMIN WITH MINERALS) TABS tablet Take 1 tablet by mouth daily.      . tadalafil (CIALIS) 20 MG tablet Take 20 mg by mouth daily as needed for erectile dysfunction.       Scheduled: . antiseptic oral rinse  7 mL Mouth Rinse q12n4p  . apixaban  5 mg Oral BID  . atorvastatin  10 mg Oral QHS  . chlorhexidine  15 mL Mouth Rinse BID  . furosemide  20 mg Intravenous BID  . insulin aspart  0-9 Units Subcutaneous 6 times per day  . latanoprost  1 drop Left Eye QHS  . metoprolol tartrate  12.5 mg Oral BID  . piperacillin-tazobactam (ZOSYN)  IV  3.375 g Intravenous Q8H  . pneumococcal 23 valent vaccine  0.5 mL Intramuscular Tomorrow-1000  . vancomycin  1,000 mg Intravenous Q24H   Continuous: . dextrose 5 % and 0.2 % NaCl 1,000 mL with sodium bicarbonate 100 mEq infusion 70 mL/hr at 06/08/14 0827   ZOX:[id number], acetaminophen, alum & mag hydroxide-simeth, HYDROcodone-acetaminophen, ondansetron (ZOFRAN) IV, ondansetron, senna-docusate, traZODone  Assesment: He was admitted with sepsis. I think he has aspirated based on the appearance of the chest x-ray and the markedly enlarged gas bubble seen on CT. His new  medication has GI side effects as a predominant part of the problem. I do not think this represents ARDS because it's not taking much pressure from the BiPAP to ventilate him. He appears to have improved. Principal Problem:   Sepsis Active Problems:   Hypertension   Atrial fibrillation   Acute on chronic diastolic heart failure   Acute renal failure   DM type 2 (diabetes mellitus, type 2)   HCAP (healthcare-associated pneumonia)   Acute respiratory failure with hypoxia   Metabolic acidosis   Hyperkalemia   Elevated transaminase level    Plan: Continue treatments. I think add steroids.    LOS: 1 day   Bianka Liberati L 06/08/2014, 8:47 AM

## 2014-06-08 NOTE — Consult Note (Addendum)
REVIEWED. NG TUBE NO RETURN. PT DENIES NAUSEA/VOMITING/ABDOMINAL PAIN. HOLD ELIQUIS. CLEAR LIQUID DIET. D/C NG TUBE.

## 2014-06-09 ENCOUNTER — Inpatient Hospital Stay (HOSPITAL_COMMUNITY): Payer: Medicare Other

## 2014-06-09 DIAGNOSIS — R7402 Elevation of levels of lactic acid dehydrogenase (LDH): Secondary | ICD-10-CM

## 2014-06-09 DIAGNOSIS — R740 Nonspecific elevation of levels of transaminase and lactic acid dehydrogenase [LDH]: Secondary | ICD-10-CM

## 2014-06-09 DIAGNOSIS — N179 Acute kidney failure, unspecified: Secondary | ICD-10-CM

## 2014-06-09 DIAGNOSIS — J9600 Acute respiratory failure, unspecified whether with hypoxia or hypercapnia: Secondary | ICD-10-CM

## 2014-06-09 DIAGNOSIS — I509 Heart failure, unspecified: Secondary | ICD-10-CM

## 2014-06-09 DIAGNOSIS — I4891 Unspecified atrial fibrillation: Secondary | ICD-10-CM

## 2014-06-09 DIAGNOSIS — I5033 Acute on chronic diastolic (congestive) heart failure: Secondary | ICD-10-CM

## 2014-06-09 DIAGNOSIS — J189 Pneumonia, unspecified organism: Secondary | ICD-10-CM

## 2014-06-09 LAB — GLUCOSE, CAPILLARY
GLUCOSE-CAPILLARY: 152 mg/dL — AB (ref 70–99)
GLUCOSE-CAPILLARY: 160 mg/dL — AB (ref 70–99)
GLUCOSE-CAPILLARY: 175 mg/dL — AB (ref 70–99)
GLUCOSE-CAPILLARY: 288 mg/dL — AB (ref 70–99)
Glucose-Capillary: 161 mg/dL — ABNORMAL HIGH (ref 70–99)
Glucose-Capillary: 277 mg/dL — ABNORMAL HIGH (ref 70–99)

## 2014-06-09 LAB — BLOOD GAS, ARTERIAL
Acid-Base Excess: 3.1 mmol/L — ABNORMAL HIGH (ref 0.0–2.0)
BICARBONATE: 26.8 meq/L — AB (ref 20.0–24.0)
Delivery systems: POSITIVE
Drawn by: 22223
EXPIRATORY PAP: 5
FIO2: 45 %
Inspiratory PAP: 10
O2 SAT: 93.4 %
PCO2 ART: 38.3 mmHg (ref 35.0–45.0)
PO2 ART: 70.2 mmHg — AB (ref 80.0–100.0)
Patient temperature: 37
RATE: 8 resp/min
TCO2: 23.4 mmol/L (ref 0–100)
pH, Arterial: 7.459 — ABNORMAL HIGH (ref 7.350–7.450)

## 2014-06-09 LAB — EXPECTORATED SPUTUM ASSESSMENT W GRAM STAIN, RFLX TO RESP C

## 2014-06-09 LAB — PROCALCITONIN: Procalcitonin: 20.97 ng/mL

## 2014-06-09 LAB — HEPATIC FUNCTION PANEL
ALBUMIN: 2.8 g/dL — AB (ref 3.5–5.2)
ALT: 1071 U/L — ABNORMAL HIGH (ref 0–53)
AST: 718 U/L — AB (ref 0–37)
Alkaline Phosphatase: 50 U/L (ref 39–117)
Bilirubin, Direct: 1.1 mg/dL — ABNORMAL HIGH (ref 0.0–0.3)
Indirect Bilirubin: 1.5 mg/dL — ABNORMAL HIGH (ref 0.3–0.9)
TOTAL PROTEIN: 5.9 g/dL — AB (ref 6.0–8.3)
Total Bilirubin: 2.6 mg/dL — ABNORMAL HIGH (ref 0.3–1.2)

## 2014-06-09 LAB — BASIC METABOLIC PANEL
Anion gap: 11 (ref 5–15)
BUN: 30 mg/dL — ABNORMAL HIGH (ref 6–23)
CO2: 28 mEq/L (ref 19–32)
Calcium: 8 mg/dL — ABNORMAL LOW (ref 8.4–10.5)
Chloride: 101 mEq/L (ref 96–112)
Creatinine, Ser: 1.56 mg/dL — ABNORMAL HIGH (ref 0.50–1.35)
GFR calc non Af Amer: 42 mL/min — ABNORMAL LOW (ref >90)
GFR, EST AFRICAN AMERICAN: 48 mL/min — AB (ref >90)
Glucose, Bld: 163 mg/dL — ABNORMAL HIGH (ref 70–99)
POTASSIUM: 4.2 meq/L (ref 3.7–5.3)
SODIUM: 140 meq/L (ref 137–147)

## 2014-06-09 LAB — CBC
HEMATOCRIT: 42.6 % (ref 39.0–52.0)
Hemoglobin: 13.9 g/dL (ref 13.0–17.0)
MCH: 33.8 pg (ref 26.0–34.0)
MCHC: 32.6 g/dL (ref 30.0–36.0)
MCV: 103.6 fL — AB (ref 78.0–100.0)
Platelets: 156 10*3/uL (ref 150–400)
RBC: 4.11 MIL/uL — AB (ref 4.22–5.81)
RDW: 15.2 % (ref 11.5–15.5)
WBC: 9.5 10*3/uL (ref 4.0–10.5)

## 2014-06-09 LAB — EXPECTORATED SPUTUM ASSESSMENT W REFEX TO RESP CULTURE

## 2014-06-09 LAB — HEPATITIS PANEL, ACUTE
HCV AB: NEGATIVE
HEP A IGM: NONREACTIVE
Hep B C IgM: NONREACTIVE
Hepatitis B Surface Ag: NEGATIVE

## 2014-06-09 LAB — PROTIME-INR
INR: 2.56 — AB (ref 0.00–1.49)
Prothrombin Time: 27.5 seconds — ABNORMAL HIGH (ref 11.6–15.2)

## 2014-06-09 LAB — LACTIC ACID, PLASMA: Lactic Acid, Venous: 2.5 mmol/L — ABNORMAL HIGH (ref 0.5–2.2)

## 2014-06-09 MED ORDER — LEVALBUTEROL HCL 0.63 MG/3ML IN NEBU
0.6300 mg | INHALATION_SOLUTION | Freq: Three times a day (TID) | RESPIRATORY_TRACT | Status: DC
  Administered 2014-06-09 – 2014-06-17 (×23): 0.63 mg via RESPIRATORY_TRACT
  Filled 2014-06-09 (×24): qty 3

## 2014-06-09 MED ORDER — FUROSEMIDE 10 MG/ML IJ SOLN
40.0000 mg | Freq: Two times a day (BID) | INTRAMUSCULAR | Status: DC
  Administered 2014-06-09 – 2014-06-12 (×6): 40 mg via INTRAVENOUS
  Filled 2014-06-09 (×6): qty 4

## 2014-06-09 MED ORDER — ALBUTEROL SULFATE (2.5 MG/3ML) 0.083% IN NEBU: 2.5000 mg | INHALATION_SOLUTION | RESPIRATORY_TRACT | Status: DC | PRN

## 2014-06-09 NOTE — Progress Notes (Signed)
ANTIBIOTIC CONSULT NOTE - follow up  Pharmacy Consult for Vancomycin Indication: sepsis  No Known Allergies  Patient Measurements: Height:  (180.3 cm) Weight: 169 lb 5 oz (76.8 kg) IBW/kg (Calculated) : 75.3  Vital Signs: Temp: 97.7 F (36.5 C) (08/26 0800) Temp src: Axillary (08/26 0800) BP: 127/91 mmHg (08/26 0900) Pulse Rate: 83 (08/26 0904) Intake/Output from previous day: 08/25 0701 - 08/26 0700 In: 6725 [P.O.:960; I.V.:2965; IV Piggyback:400] Out: 1000 [Urine:1000] Intake/Output from this shift:    Labs:  Recent Labs  06/07/14 1008  06/08/14 0209 06/08/14 0503 06/09/14 0434  WBC 13.9*  --   --  8.4 9.5  HGB 15.8  --   --  14.0 13.9  PLT 161  --   --  144* 156  CREATININE 1.69*  < > 1.75* 1.80* 1.56*  < > = values in this interval not displayed. Estimated Creatinine Clearance: 43.6 ml/min (by C-G formula based on Cr of 1.56). No results found for this basename: VANCOTROUGH, Leodis Binet, VANCORANDOM, GENTTROUGH, GENTPEAK, GENTRANDOM, TOBRATROUGH, TOBRAPEAK, TOBRARND, AMIKACINPEAK, AMIKACINTROU, AMIKACIN,  in the last 72 hours   Microbiology: Recent Results (from the past 720 hour(s))  CULTURE, BLOOD (ROUTINE X 2)     Status: None   Collection Time    06/07/14 10:45 AM      Result Value Ref Range Status   Specimen Description BLOOD RIGHT FOREARM DRAWN BY RN AMANDA   Final   Special Requests BOTTLES DRAWN AEROBIC ONLY 4CC   Final   Culture NO GROWTH 2 DAYS   Final   Report Status PENDING   Incomplete  CULTURE, BLOOD (ROUTINE X 2)     Status: None   Collection Time    06/07/14 11:18 AM      Result Value Ref Range Status   Specimen Description BLOOD LEFT ARM   Final   Special Requests BOTTLES DRAWN AEROBIC AND ANAEROBIC 6CC   Final   Culture NO GROWTH 2 DAYS   Final   Report Status PENDING   Incomplete  URINE CULTURE     Status: None   Collection Time    06/07/14 12:30 PM      Result Value Ref Range Status   Specimen Description URINE, CATHETERIZED    Final   Special Requests NONE   Final   Culture  Setup Time     Final   Value: 06/07/2014 20:28     Performed at [facility] Count     Final   Value: NO GROWTH     Performed at [facility]   Culture     Final   Value: NO GROWTH     Performed at [facility]   Report Status 06/08/2014 FINAL   Final  MRSA PCR SCREENING     Status: None   Collection Time    06/07/14  2:05 PM      Result Value Ref Range Status   MRSA by PCR NEGATIVE  NEGATIVE Final   Comment:            The GeneXpert MRSA Assay (FDA     approved for NASAL specimens     only), is one component of a     comprehensive MRSA colonization     surveillance program. It is not     intended to diagnose MRSA     infection nor to guide or     monitor treatment for     MRSA infections.  CULTURE, EXPECTORATED  SPUTUM-ASSESSMENT     Status: None   Collection Time    06/08/14  2:00 PM      Result Value Ref Range Status   Specimen Description SPUTUM EXPECTORATED   Final   Special Requests NONE   Final   Sputum evaluation     Final   Value: THIS SPECIMEN IS ACCEPTABLE. RESPIRATORY CULTURE REPORT TO FOLLOW.     Performed at [facility]   Report Status PENDING   Incomplete  CULTURE, RESPIRATORY (NON-EXPECTORATED)     Status: None   Collection Time    06/08/14  2:00 PM      Result Value Ref Range Status   Specimen Description SPUTUM EXPECTORATED   Final   Special Requests NONE   Final   Gram Stain     Final   Value: MODERATE WBC PRESENT,BOTH PMN AND MONONUCLEAR     FEW SQUAMOUS EPITHELIAL CELLS PRESENT     RARE GRAM POSITIVE COCCI     IN PAIRS FEW YEAST     Performed at [facility]   Culture PENDING   Incomplete   Report Status PENDING   Incomplete   Medical History: Past Medical History  Diagnosis Date  . Essential hypertension, benign   . Type 2 diabetes mellitus   . Atrial fibrillation     Dr. [name] [facility]  . Mixed hyperlipidemia   . Pulmonary fibrosis      dx 11/2013  . Pneumonia     11/2013  . Chronic diastolic heart failure   . On home O2     2L N/C prn  . COPD (chronic obstructive pulmonary disease)    Medications:  Scheduled:  . antiseptic oral rinse  7 mL Mouth Rinse q12n4p  . chlorhexidine  15 mL Mouth Rinse BID  . insulin aspart  0-9 Units Subcutaneous 6 times per day  . latanoprost  1 drop Left Eye QHS  . levalbuterol  0.63 mg Nebulization Q6H  . methylPREDNISolone (SOLU-MEDROL) injection  40 mg Intravenous Q6H  . metoprolol tartrate  12.5 mg Oral BID  . pantoprazole  40 mg Oral QAC supper  . piperacillin-tazobactam (ZOSYN)  IV  3.375 g Intravenous Q8H  . vancomycin  1,000 mg Intravenous Q24H   Assessment: 73 yo M who presents with shortness of breath.  He was hospitalized ~ 1 month with PNA.   He was febrile & tachycardic with elevated WBC and lactic acid level on admission.  Scr has improved some.  Currently afebrile with normal WBC.  CXR cannot exclude PNA.  Cx data pending.   Vancomycin 8/24>> Zosyn 8/24>>  Goal of Therapy:  Vancomycin trough level 15-20 mcg/ml  Plan:  Vancomycin 1gm IV q24h Check Vancomycin trough at steady state (anticipate Friday) Monitor renal function and cx data   Also continuing Zosyn for gram negative coverage.  1.  Zosyn 3.375gm IV Q8h to be infused over 4hrs  Linna Thebeau A 06/09/2014,10:42 AM

## 2014-06-09 NOTE — Progress Notes (Signed)
Subjective: He says he feels better. He is on BiPAP at this point. He gives me further history that when he went to bed on the night of admission he felt okay and then when he got up to go to the bathroom he had severe shortness of breath.  Objective: Vital signs in last 24 hours: Temp:  [97.2 F (36.2 C)-98.6 F (37 C)] 97.6 F (36.4 C) (08/26 0400) Pulse Rate:  [25-94] 87 (08/26 0752) Resp:  [14-33] 16 (08/26 0752) BP: (79-139)/(46-93) 125/69 mmHg (08/26 0708) SpO2:  [71 %-100 %] 97 % (08/26 0752) FiO2 (%):  [40 %-50 %] 40 % (08/26 0752) Weight:  [76.8 kg (169 lb 5 oz)] 76.8 kg (169 lb 5 oz) (08/26 0444) Weight change: 4.224 kg (9 lb 5 oz) Last BM Date: 06/07/14  Intake/Output from previous day: 08/25 0701 - 08/26 0700 In: 6725 [P.O.:960; I.V.:2965; IV Piggyback:400] Out: 1000 [Urine:1000]  PHYSICAL EXAM General appearance: alert, cooperative, mild distress and On BiPAP which makes communication difficult Resp: rhonchi bilaterally Cardio: He is in atrial fibrillation. I do not hear a gallop GI: soft, non-tender; bowel sounds normal; no masses,  no organomegaly Extremities: Trace edema  Lab Results:  Results for orders placed during the hospital encounter of 06/07/14 (from the past 48 hour(s))  CBG MONITORING, ED     Status: Abnormal   Collection Time    06/07/14  9:50 AM      Result Value Ref Range   Glucose-Capillary 53 (*) 70 - 99 mg/dL  HEMOGLOBIN A1C     Status: Abnormal   Collection Time    06/07/14  9:57 AM      Result Value Ref Range   Hemoglobin A1C 6.3 (*) <5.7 %   Comment: (NOTE)                                                                               According to the ADA Clinical Practice Recommendations for 2011, when     HbA1c is used as a screening test:      >=6.5%   Diagnostic of Diabetes Mellitus               (if abnormal result is confirmed)     5.7-6.4%   Increased risk of developing Diabetes Mellitus     References:Diagnosis and  Classification of Diabetes Mellitus,Diabetes     KYHC,6237,62(GBTDV 1):S62-S69 and Standards of Medical Care in             Diabetes - 2011,Diabetes Care,2011,34 (Suppl 1):S11-S61.   Mean Plasma Glucose 134 (*) <117 mg/dL   Comment: Performed at [place]     Status: Abnormal   Collection Time    06/07/14 10:08 AM      Result Value Ref Range   Sodium 138  137 - 147 mEq/L   Potassium 5.6 (*) 3.7 - 5.3 mEq/L   Chloride 96  96 - 112 mEq/L   CO2 14 (*) 19 - 32 mEq/L   Glucose, Bld 54 (*) 70 - 99 mg/dL   BUN 26 (*) 6 - 23 mg/dL   Creatinine, Ser 1.69 (*) 0.50 - 1.35 mg/dL   Calcium 9.7  8.4 - 10.5 mg/dL   GFR calc non Af Amer 38 (*) >90 mL/min   GFR calc Af Amer 44 (*) >90 mL/min   Comment: (NOTE)     The eGFR has been calculated using the CKD EPI equation.     This calculation has not been validated in all clinical situations.     eGFR's persistently <90 mL/min signify possible Chronic Kidney     Disease.   Anion gap 28 (*) 5 - 15  CBC     Status: Abnormal   Collection Time    06/07/14 10:08 AM      Result Value Ref Range   WBC 13.9 (*) 4.0 - 10.5 K/uL   RBC 4.54  4.22 - 5.81 MIL/uL   Hemoglobin 15.8  13.0 - 17.0 g/dL   HCT 50.1  39.0 - 52.0 %   MCV 110.4 (*) 78.0 - 100.0 fL   MCH 34.8 (*) 26.0 - 34.0 pg   MCHC 31.5  30.0 - 36.0 g/dL   RDW 15.8 (*) 11.5 - 15.5 %   Platelets 161  150 - 400 K/uL  TROPONIN I     Status: None   Collection Time    06/07/14 10:08 AM      Result Value Ref Range   Troponin I <0.30  <0.30 ng/mL   Comment:            Due to the release kinetics of cTnI,     a negative result within the first hours     of the onset of symptoms does not rule out     myocardial infarction with certainty.     If myocardial infarction is still suspected,     repeat the test at appropriate intervals.  PRO B NATRIURETIC PEPTIDE     Status: Abnormal   Collection Time    06/07/14 10:08 AM      Result Value Ref Range   Pro B Natriuretic  peptide (BNP) 10442.0 (*) 0 - 450 pg/mL  BLOOD GAS, ARTERIAL     Status: Abnormal   Collection Time    06/07/14 10:15 AM      Result Value Ref Range   FIO2 100.00     Delivery systems NON-REBREATHER OXYGEN MASK     pH, Arterial 7.216 (*) 7.350 - 7.450   pCO2 arterial 35.6  35.0 - 45.0 mmHg   pO2, Arterial 64.8 (*) 80.0 - 100.0 mmHg   Bicarbonate 13.9 (*) 20.0 - 24.0 mEq/L   TCO2 12.8  0 - 100 mmol/L   Acid-base deficit 12.4 (*) 0.0 - 2.0 mmol/L   O2 Saturation 83.2     Patient temperature 37.0     Collection site LEFT RADIAL     Drawn by [id number]     Sample type ARTERIAL     Allens test (pass/fail) PASS  PASS  LACTIC ACID, PLASMA     Status: Abnormal   Collection Time    06/07/14 10:40 AM      Result Value Ref Range   Lactic Acid, Venous 11.3 (*) 0.5 - 2.2 mmol/L  CULTURE, BLOOD (ROUTINE X 2)     Status: None   Collection Time    06/07/14 10:45 AM      Result Value Ref Range   Specimen Description BLOOD RIGHT FOREARM DRAWN BY RN AMANDA     Special Requests BOTTLES DRAWN AEROBIC ONLY 4CC     Culture NO GROWTH 1 DAY     Report Status PENDING  HEPATITIS PANEL, ACUTE     Status: None   Collection Time    06/07/14 11:08 AM      Result Value Ref Range   Hepatitis B Surface Ag NEGATIVE  NEGATIVE   HCV Ab NEGATIVE  NEGATIVE   Hep A IgM NON REACTIVE  NON REACTIVE   Hep B C IgM NON REACTIVE  NON REACTIVE   Comment: (NOTE)     High levels of Hepatitis B Core IgM antibody are detectable     during the acute stage of Hepatitis B. This antibody is used     to differentiate current from past HBV infection.     Performed at [place] PANEL     Status: Abnormal   Collection Time    06/07/14 11:18 AM      Result Value Ref Range   Total Protein 7.8  6.0 - 8.3 g/dL   Albumin 3.7  3.5 - 5.2 g/dL   AST 205 (*) 0 - 37 U/L   ALT 130 (*) 0 - 53 U/L   Alkaline Phosphatase 64  39 - 117 U/L   Total Bilirubin 2.1 (*) 0.3 - 1.2 mg/dL   Bilirubin, Direct 0.9 (*)  0.0 - 0.3 mg/dL   Indirect Bilirubin 1.2 (*) 0.3 - 0.9 mg/dL  CULTURE, BLOOD (ROUTINE X 2)     Status: None   Collection Time    06/07/14 11:18 AM      Result Value Ref Range   Specimen Description BLOOD LEFT ARM     Special Requests BOTTLES DRAWN AEROBIC AND ANAEROBIC 6CC     Culture NO GROWTH 1 DAY     Report Status PENDING    PROCALCITONIN     Status: None   Collection Time    06/07/14 11:18 AM      Result Value Ref Range   Procalcitonin 14.36     Comment:            Interpretation:     PCT >= 10 ng/mL:     Important systemic inflammatory response,     almost exclusively due to severe bacterial     sepsis or septic shock.     (NOTE)             ICU PCT Algorithm               Non ICU PCT Algorithm        ----------------------------     ------------------------------             PCT < 0.25 ng/mL                 PCT < 0.1 ng/mL         Stopping of antibiotics            Stopping of antibiotics           strongly encouraged.               strongly encouraged.        ----------------------------     ------------------------------           PCT level decrease by               PCT < 0.25 ng/mL           >= 80% from peak PCT           OR PCT 0.25 - 0.5 ng/mL  Stopping of antibiotics                                                 encouraged.         Stopping of antibiotics               encouraged.        ----------------------------     ------------------------------           PCT level decrease by              PCT >= 0.25 ng/mL           < 80% from peak PCT            AND PCT >= 0.5 ng/mL            Continuing antibiotics                                                  encouraged.           Continuing antibiotics                encouraged.        ----------------------------     ------------------------------         PCT level increase compared          PCT > 0.5 ng/mL             with peak PCT AND              PCT >= 0.5 ng/mL             Escalation of antibiotics                                               strongly encouraged.          Escalation of antibiotics            strongly encouraged.  CBG MONITORING, ED     Status: Abnormal   Collection Time    06/07/14 11:21 AM      Result Value Ref Range   Glucose-Capillary 157 (*) 70 - 99 mg/dL   Comment 1 Documented in Chart     Comment 2 Notify RN    BLOOD GAS, ARTERIAL     Status: Abnormal   Collection Time    06/07/14 12:20 PM      Result Value Ref Range   FIO2 100.00     Delivery systems BILEVEL POSITIVE AIRWAY PRESSURE     Inspiratory PAP 12     Expiratory PAP 6     pH, Arterial 7.288 (*) 7.350 - 7.450   pCO2 arterial 34.5 (*) 35.0 - 45.0 mmHg   pO2, Arterial 131.0 (*) 80.0 - 100.0 mmHg   Bicarbonate 16.0 (*) 20.0 - 24.0 mEq/L   TCO2 14.6  0 - 100 mmol/L   Acid-base deficit 9.3 (*) 0.0 - 2.0 mmol/L   O2 Saturation 98.3     Patient temperature 37.0     Collection site LEFT RADIAL     Drawn by  [id number]     Sample type ARTERIAL     Allens test (pass/fail) PASS  PASS  URINALYSIS, ROUTINE W REFLEX MICROSCOPIC     Status: Abnormal   Collection Time    06/07/14 12:30 PM      Result Value Ref Range   Color, Urine YELLOW  YELLOW   APPearance CLEAR  CLEAR   Specific Gravity, Urine 1.025  1.005 - 1.030   pH 5.0  5.0 - 8.0   Glucose, UA NEGATIVE  NEGATIVE mg/dL   Hgb urine dipstick SMALL (*) NEGATIVE   Bilirubin Urine NEGATIVE  NEGATIVE   Ketones, ur NEGATIVE  NEGATIVE mg/dL   Protein, ur 30 (*) NEGATIVE mg/dL   Urobilinogen, UA 1.0  0.0 - 1.0 mg/dL   Nitrite NEGATIVE  NEGATIVE   Leukocytes, UA NEGATIVE  NEGATIVE  URINE CULTURE     Status: None   Collection Time    06/07/14 12:30 PM      Result Value Ref Range   Specimen Description URINE, CATHETERIZED     Special Requests NONE     Culture  Setup Time       Value: 06/07/2014 20:28     Performed at [facility] Count       Value: NO GROWTH     Performed at [facility]   Culture       Value: NO GROWTH      Performed at [facility]   Report Status 06/08/2014 FINAL    URINE MICROSCOPIC-ADD ON     Status: Abnormal   Collection Time    06/07/14 12:30 PM      Result Value Ref Range   Squamous Epithelial / LPF FEW (*) RARE   WBC, UA 3-6  <3 WBC/hpf   RBC / HPF 0-2  <3 RBC/hpf   Bacteria, UA MANY (*) RARE   Casts HYALINE CASTS (*) NEGATIVE  MRSA PCR SCREENING     Status: None   Collection Time    06/07/14  2:05 PM      Result Value Ref Range   MRSA by PCR NEGATIVE  NEGATIVE   Comment:            The GeneXpert MRSA Assay (FDA     approved for NASAL specimens     only), is one component of a     comprehensive MRSA colonization     surveillance program. It is not     intended to diagnose MRSA     infection nor to guide or     monitor treatment for     MRSA infections.  URINALYSIS, ROUTINE W REFLEX MICROSCOPIC     Status: Abnormal   Collection Time    06/07/14  2:05 PM      Result Value Ref Range   Color, Urine YELLOW  YELLOW   APPearance CLEAR  CLEAR   Specific Gravity, Urine 1.020  1.005 - 1.030   pH 5.0  5.0 - 8.0   Glucose, UA NEGATIVE  NEGATIVE mg/dL   Hgb urine dipstick MODERATE (*) NEGATIVE   Bilirubin Urine NEGATIVE  NEGATIVE   Ketones, ur NEGATIVE  NEGATIVE mg/dL   Protein, ur TRACE (*) NEGATIVE mg/dL   Urobilinogen, UA 0.2  0.0 - 1.0 mg/dL   Nitrite NEGATIVE  NEGATIVE   Leukocytes, UA NEGATIVE  NEGATIVE  URINE MICROSCOPIC-ADD ON     Status: Abnormal   Collection Time    06/07/14  2:05 PM  Result Value Ref Range   Squamous Epithelial / LPF RARE  RARE   WBC, UA 0-2  <3 WBC/hpf   RBC / HPF 11-20  <3 RBC/hpf   Bacteria, UA FEW (*) RARE  GLUCOSE, CAPILLARY     Status: Abnormal   Collection Time    06/07/14  4:33 PM      Result Value Ref Range   Glucose-Capillary 170 (*) 70 - 99 mg/dL  BASIC METABOLIC PANEL     Status: Abnormal   Collection Time    06/07/14  6:27 PM      Result Value Ref Range   Sodium 135 (*) 137 - 147 mEq/L   Potassium 6.2 (*) 3.7 -  5.3 mEq/L   Chloride 96  96 - 112 mEq/L   CO2 19  19 - 32 mEq/L   Glucose, Bld 158 (*) 70 - 99 mg/dL   BUN 31 (*) 6 - 23 mg/dL   Creatinine, Ser 1.74 (*) 0.50 - 1.35 mg/dL   Calcium 8.5  8.4 - 10.5 mg/dL   GFR calc non Af Amer 37 (*) >90 mL/min   GFR calc Af Amer 42 (*) >90 mL/min   Comment: (NOTE)     The eGFR has been calculated using the CKD EPI equation.     This calculation has not been validated in all clinical situations.     eGFR's persistently <90 mL/min signify possible Chronic Kidney     Disease.   Anion gap 20 (*) 5 - 15  GLUCOSE, CAPILLARY     Status: Abnormal   Collection Time    06/07/14  7:55 PM      Result Value Ref Range   Glucose-Capillary 171 (*) 70 - 99 mg/dL  GLUCOSE, CAPILLARY     Status: Abnormal   Collection Time    06/08/14 12:10 AM      Result Value Ref Range   Glucose-Capillary 143 (*) 70 - 99 mg/dL   Comment 1 Notify RN    BASIC METABOLIC PANEL     Status: Abnormal   Collection Time    06/08/14  2:09 AM      Result Value Ref Range   Sodium 134 (*) 137 - 147 mEq/L   Potassium 5.1  3.7 - 5.3 mEq/L   Comment: DELTA CHECK NOTED   Chloride 96  96 - 112 mEq/L   CO2 21  19 - 32 mEq/L   Glucose, Bld 155 (*) 70 - 99 mg/dL   BUN 32 (*) 6 - 23 mg/dL   Creatinine, Ser 1.75 (*) 0.50 - 1.35 mg/dL   Calcium 8.3 (*) 8.4 - 10.5 mg/dL   GFR calc non Af Amer 36 (*) >90 mL/min   GFR calc Af Amer 42 (*) >90 mL/min   Comment: (NOTE)     The eGFR has been calculated using the CKD EPI equation.     This calculation has not been validated in all clinical situations.     eGFR's persistently <90 mL/min signify possible Chronic Kidney     Disease.   Anion gap 17 (*) 5 - 15  GLUCOSE, CAPILLARY     Status: Abnormal   Collection Time    06/08/14  3:44 AM      Result Value Ref Range   Glucose-Capillary 127 (*) 70 - 99 mg/dL   Comment 1 Notify RN    COMPREHENSIVE METABOLIC PANEL     Status: Abnormal   Collection Time    06/08/14  5:03 AM  Result Value Ref Range    Sodium 139  137 - 147 mEq/L   Potassium 5.0  3.7 - 5.3 mEq/L   Chloride 98  96 - 112 mEq/L   CO2 23  19 - 32 mEq/L   Glucose, Bld 131 (*) 70 - 99 mg/dL   BUN 32 (*) 6 - 23 mg/dL   Creatinine, Ser 1.80 (*) 0.50 - 1.35 mg/dL   Calcium 8.3 (*) 8.4 - 10.5 mg/dL   Total Protein 6.6  6.0 - 8.3 g/dL   Albumin 3.2 (*) 3.5 - 5.2 g/dL   AST 2096 (*) 0 - 37 U/L   ALT 1316 (*) 0 - 53 U/L   Alkaline Phosphatase 51  39 - 117 U/L   Total Bilirubin 1.9 (*) 0.3 - 1.2 mg/dL   GFR calc non Af Amer 35 (*) >90 mL/min   GFR calc Af Amer 41 (*) >90 mL/min   Comment: (NOTE)     The eGFR has been calculated using the CKD EPI equation.     This calculation has not been validated in all clinical situations.     eGFR's persistently <90 mL/min signify possible Chronic Kidney     Disease.   Anion gap 18 (*) 5 - 15  CBC     Status: Abnormal   Collection Time    06/08/14  5:03 AM      Result Value Ref Range   WBC 8.4  4.0 - 10.5 K/uL   RBC 4.09 (*) 4.22 - 5.81 MIL/uL   Hemoglobin 14.0  13.0 - 17.0 g/dL   HCT 43.4  39.0 - 52.0 %   MCV 106.1 (*) 78.0 - 100.0 fL   MCH 34.2 (*) 26.0 - 34.0 pg   MCHC 32.3  30.0 - 36.0 g/dL   RDW 15.6 (*) 11.5 - 15.5 %   Platelets 144 (*) 150 - 400 K/uL  PROCALCITONIN     Status: None   Collection Time    06/08/14  5:03 AM      Result Value Ref Range   Procalcitonin 35.99     Comment:            Interpretation:     PCT >= 10 ng/mL:     Important systemic inflammatory response,     almost exclusively due to severe bacterial     sepsis or septic shock.     (NOTE)             ICU PCT Algorithm               Non ICU PCT Algorithm        ----------------------------     ------------------------------             PCT < 0.25 ng/mL                 PCT < 0.1 ng/mL         Stopping of antibiotics            Stopping of antibiotics           strongly encouraged.               strongly encouraged.        ----------------------------     ------------------------------            PCT level decrease by               PCT < 0.25 ng/mL           >=  80% from peak PCT           OR PCT 0.25 - 0.5 ng/mL          Stopping of antibiotics                                                 encouraged.         Stopping of antibiotics               encouraged.        ----------------------------     ------------------------------           PCT level decrease by              PCT >= 0.25 ng/mL           < 80% from peak PCT            AND PCT >= 0.5 ng/mL            Continuing antibiotics                                                  encouraged.           Continuing antibiotics                encouraged.        ----------------------------     ------------------------------         PCT level increase compared          PCT > 0.5 ng/mL             with peak PCT AND              PCT >= 0.5 ng/mL             Escalation of antibiotics                                              strongly encouraged.          Escalation of antibiotics            strongly encouraged.  LACTIC ACID, PLASMA     Status: Abnormal   Collection Time    06/08/14  5:03 AM      Result Value Ref Range   Lactic Acid, Venous 4.8 (*) 0.5 - 2.2 mmol/L  BLOOD GAS, ARTERIAL     Status: Abnormal   Collection Time    06/08/14  5:40 AM      Result Value Ref Range   FIO2 0.60     Delivery systems BILEVEL POSITIVE AIRWAY PRESSURE     Rate 8     Inspiratory PAP 10     Expiratory PAP 5     pH, Arterial 7.436  7.350 - 7.450   pCO2 arterial 36.7  35.0 - 45.0 mmHg   pO2, Arterial 88.9  80.0 - 100.0 mmHg   Bicarbonate 24.3 (*) 20.0 - 24.0 mEq/L   TCO2 21.2  0 - 100 mmol/L   Acid-Base Excess 0.6  0.0 - 2.0 mmol/L   O2 Saturation 97.0  Patient temperature 37.0     Collection site RIGHT RADIAL     Drawn by [phone]     Sample type ARTERIAL DRAW     Allens test (pass/fail) PASS  PASS  GLUCOSE, CAPILLARY     Status: Abnormal   Collection Time    06/08/14  7:31 AM      Result Value Ref Range   Glucose-Capillary 121  (*) 70 - 99 mg/dL  GLUCOSE, CAPILLARY     Status: Abnormal   Collection Time    06/08/14 11:41 AM      Result Value Ref Range   Glucose-Capillary 182 (*) 70 - 99 mg/dL  CULTURE, EXPECTORATED SPUTUM-ASSESSMENT     Status: None   Collection Time    06/08/14  2:00 PM      Result Value Ref Range   Specimen Description SPUTUM EXPECTORATED     Special Requests NONE     Sputum evaluation       Value: THIS SPECIMEN IS ACCEPTABLE. RESPIRATORY CULTURE REPORT TO FOLLOW.     Performed at [facility]   Report Status PENDING    GLUCOSE, CAPILLARY     Status: Abnormal   Collection Time    06/08/14  4:50 PM      Result Value Ref Range   Glucose-Capillary 182 (*) 70 - 99 mg/dL   Comment 1 Documented in Chart     Comment 2 Notify RN    GLUCOSE, CAPILLARY     Status: Abnormal   Collection Time    06/08/14  7:52 PM      Result Value Ref Range   Glucose-Capillary 237 (*) 70 - 99 mg/dL  GLUCOSE, CAPILLARY     Status: Abnormal   Collection Time    06/09/14 12:22 AM      Result Value Ref Range   Glucose-Capillary 160 (*) 70 - 99 mg/dL  BASIC METABOLIC PANEL     Status: Abnormal   Collection Time    06/09/14  4:34 AM      Result Value Ref Range   Sodium 140  137 - 147 mEq/L   Potassium 4.2  3.7 - 5.3 mEq/L   Chloride 101  96 - 112 mEq/L   CO2 28  19 - 32 mEq/L   Glucose, Bld 163 (*) 70 - 99 mg/dL   BUN 30 (*) 6 - 23 mg/dL   Creatinine, Ser 1.56 (*) 0.50 - 1.35 mg/dL   Calcium 8.0 (*) 8.4 - 10.5 mg/dL   GFR calc non Af Amer 42 (*) >90 mL/min   GFR calc Af Amer 48 (*) >90 mL/min   Comment: (NOTE)     The eGFR has been calculated using the CKD EPI equation.     This calculation has not been validated in all clinical situations.     eGFR's persistently <90 mL/min signify possible Chronic Kidney     Disease.   Anion gap 11  5 - 15  PROCALCITONIN     Status: None   Collection Time    06/09/14  4:34 AM      Result Value Ref Range   Procalcitonin 20.97     Comment:             Interpretation:     PCT >= 10 ng/mL:     Important systemic inflammatory response,     almost exclusively due to severe bacterial     sepsis or septic shock.     (NOTE)  ICU PCT Algorithm               Non ICU PCT Algorithm        ----------------------------     ------------------------------             PCT < 0.25 ng/mL                 PCT < 0.1 ng/mL         Stopping of antibiotics            Stopping of antibiotics           strongly encouraged.               strongly encouraged.        ----------------------------     ------------------------------           PCT level decrease by               PCT < 0.25 ng/mL           >= 80% from peak PCT           OR PCT 0.25 - 0.5 ng/mL          Stopping of antibiotics                                                 encouraged.         Stopping of antibiotics               encouraged.        ----------------------------     ------------------------------           PCT level decrease by              PCT >= 0.25 ng/mL           < 80% from peak PCT            AND PCT >= 0.5 ng/mL            Continuing antibiotics                                                  encouraged.           Continuing antibiotics                encouraged.        ----------------------------     ------------------------------         PCT level increase compared          PCT > 0.5 ng/mL             with peak PCT AND              PCT >= 0.5 ng/mL             Escalation of antibiotics                                              strongly encouraged.          Escalation of antibiotics  strongly encouraged.  HEPATIC FUNCTION PANEL     Status: Abnormal   Collection Time    06/09/14  4:34 AM      Result Value Ref Range   Total Protein 5.9 (*) 6.0 - 8.3 g/dL   Albumin 2.8 (*) 3.5 - 5.2 g/dL   AST 718 (*) 0 - 37 U/L   ALT 1071 (*) 0 - 53 U/L   Alkaline Phosphatase 50  39 - 117 U/L   Total Bilirubin 2.6 (*) 0.3 - 1.2 mg/dL   Bilirubin, Direct 1.1  (*) 0.0 - 0.3 mg/dL   Indirect Bilirubin 1.5 (*) 0.3 - 0.9 mg/dL  CBC     Status: Abnormal   Collection Time    06/09/14  4:34 AM      Result Value Ref Range   WBC 9.5  4.0 - 10.5 K/uL   RBC 4.11 (*) 4.22 - 5.81 MIL/uL   Hemoglobin 13.9  13.0 - 17.0 g/dL   HCT 42.6  39.0 - 52.0 %   MCV 103.6 (*) 78.0 - 100.0 fL   MCH 33.8  26.0 - 34.0 pg   MCHC 32.6  30.0 - 36.0 g/dL   RDW 15.2  11.5 - 15.5 %   Platelets 156  150 - 400 K/uL  LACTIC ACID, PLASMA     Status: Abnormal   Collection Time    06/09/14  4:34 AM      Result Value Ref Range   Lactic Acid, Venous 2.5 (*) 0.5 - 2.2 mmol/L  GLUCOSE, CAPILLARY     Status: Abnormal   Collection Time    06/09/14  4:40 AM      Result Value Ref Range   Glucose-Capillary 152 (*) 70 - 99 mg/dL   Comment 1 Notify RN    BLOOD GAS, ARTERIAL     Status: Abnormal   Collection Time    06/09/14  4:58 AM      Result Value Ref Range   FIO2 45.00     Delivery systems BILEVEL POSITIVE AIRWAY PRESSURE     Rate 8     Inspiratory PAP 10     Expiratory PAP 5     pH, Arterial 7.459 (*) 7.350 - 7.450   pCO2 arterial 38.3  35.0 - 45.0 mmHg   pO2, Arterial 70.2 (*) 80.0 - 100.0 mmHg   Bicarbonate 26.8 (*) 20.0 - 24.0 mEq/L   TCO2 23.4  0 - 100 mmol/L   Acid-Base Excess 3.1 (*) 0.0 - 2.0 mmol/L   O2 Saturation 93.4     Patient temperature 37.0     Collection site RIGHT RADIAL     Drawn by [id number]     Sample type ARTERIAL     Allens test (pass/fail) PASS  PASS    ABGS  Recent Labs  06/09/14 0458  PHART 7.459*  PO2ART 70.2*  TCO2 23.4  HCO3 26.8*   CULTURES Recent Results (from the past 240 hour(s))  CULTURE, BLOOD (ROUTINE X 2)     Status: None   Collection Time    06/07/14 10:45 AM      Result Value Ref Range Status   Specimen Description BLOOD RIGHT FOREARM DRAWN BY RN AMANDA   Final   Special Requests BOTTLES DRAWN AEROBIC ONLY 4CC   Final   Culture NO GROWTH 1 DAY   Final   Report Status PENDING   Incomplete  CULTURE, BLOOD (ROUTINE X  2)     Status: None   Collection Time  06/07/14 11:18 AM      Result Value Ref Range Status   Specimen Description BLOOD LEFT ARM   Final   Special Requests BOTTLES DRAWN AEROBIC AND ANAEROBIC 6CC   Final   Culture NO GROWTH 1 DAY   Final   Report Status PENDING   Incomplete  URINE CULTURE     Status: None   Collection Time    06/07/14 12:30 PM      Result Value Ref Range Status   Specimen Description URINE, CATHETERIZED   Final   Special Requests NONE   Final   Culture  Setup Time     Final   Value: 06/07/2014 20:28     Performed at [facility] Count     Final   Value: NO GROWTH     Performed at [facility]   Culture     Final   Value: NO GROWTH     Performed at [facility]   Report Status 06/08/2014 FINAL   Final  MRSA PCR SCREENING     Status: None   Collection Time    06/07/14  2:05 PM      Result Value Ref Range Status   MRSA by PCR NEGATIVE  NEGATIVE Final   Comment:            The GeneXpert MRSA Assay (FDA     approved for NASAL specimens     only), is one component of a     comprehensive MRSA colonization     surveillance program. It is not     intended to diagnose MRSA     infection nor to guide or     monitor treatment for     MRSA infections.  CULTURE, EXPECTORATED SPUTUM-ASSESSMENT     Status: None   Collection Time    06/08/14  2:00 PM      Result Value Ref Range Status   Specimen Description SPUTUM EXPECTORATED   Final   Special Requests NONE   Final   Sputum evaluation     Final   Value: THIS SPECIMEN IS ACCEPTABLE. RESPIRATORY CULTURE REPORT TO FOLLOW.     Performed at [facility]   Report Status PENDING   Incomplete   Studies/Results: Ct Abdomen Pelvis Wo Contrast  06/07/2014   CLINICAL DATA:  Acute respiratory failure. Evaluate for pneumonia versus edema.  EXAM: CT CHEST, ABDOMEN AND PELVIS WITHOUT CONTRAST  TECHNIQUE: Multidetector CT imaging of the chest, abdomen and pelvis was performed following  the standard protocol without IV contrast.  COMPARISON:  Chest radiograph 06/07/2014 and CT chest without contrast and high-resolution images on 12/11/2013  FINDINGS: CT CHEST FINDINGS  Cardiomegaly with biatrial enlargement appears similar to prior chest CT. Negative for pericardial effusion. Atherosclerotic calcification of the normal caliber thoracic aorta, proximal great vessels, left anterior descending coronary artery and right coronary artery are stable. Esophagus is unremarkable. A prevascular lymph node is mildly prominent measuring 7.6 mm (previously 7 mm). Precarinal lymphadenopathy has progressed since prior chest CT of 12/11/2013. This prominent lymph node currently measures 2.1 cm AP diameter (previously 1.6 cm). Subcarinal lymph node is now 14 mm AP diameter (previously 10 mm).  There is a trace amount of pleural fluid posteriorly on the left. No pleural effusion on the right.  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 There has been a  significant interval change in appearance of the lungs since the prior chest CT of 12/11/2013. There are now extensive multifocal bilateral patchy areas of airspace disease, right greater than left, superimposed on the chronic interstitial lung disease. This manifests as extensive ground-glass opacities bilaterally with focal areas of sparing. Lung apices are fairly spared. There is no intralobular septal thickening to suggest pulmonary edema. The trachea can't mainstem bronchi are patent. Negative for pneumothorax. Lung volumes are slightly low. There is some respiratory motion artifact at the level of the mid chest. This results in an artifactually abnormal appearance of the sternum on the reformatted sagittal views. Thoracic spine vertebral bodies are normal in height and alignment.  Densely sclerotic lesion in the  posterior right second rib is stable. Densely sclerotic lesion in the posterior right fifth rib is also stable. The sclerotic lesions are well-circumscribed and appear benign.  CT ABDOMEN AND PELVIS FINDINGS  There is marked gaseous distention of the stomach. Gastric wall thickness appears normal. There is an air-fluid level in the fundus of the stomach and another air-fluid level in the pylorus/duodenal bulb. Gastric outlet obstruction cannot be excluded.  Small bowel loops and colon are normal in caliber.  The noncontrast appearance of the liver, spleen, pancreas, and kidneys is within normal limits. Negative for hydronephrosis. There is bilateral perinephric stranding, nonspecific finding. Both ureters are normal in caliber. No urinary tract stone disease is seen.  There is bilateral thickening of the adrenal glands without discrete nodules or masses.  There is heavy atherosclerotic calcification of the normal caliber abdominal aorta. Scattered atherosclerotic calcification of the iliac vasculature without aneurysm.  Normal appendix.  There is a amorphous high density within the gallbladder lumen. This was not present on prior chest CT. This could reflect gallbladder sludge and/or small gallstones.  Urinary bladder decompressed by Foley catheter. There is some air within the urinary bladder that is likely secondary to the presence of the Foley. Prostate gland appears within normal limits for size and contains some internal calcifications. Small amount of free fluid is noted in the dependent portion the pelvis. Negative for free intraperitoneal air or lymphadenopathy in the abdomen or pelvis.  There is a mild convex left in the curvature of the upper to mid lumbar spine and a mild convex right curvature at the L4-L5 level. There is partial fusion of the L4-L5 vertebral bodies along the left lateral aspect, nonsurgical. Significant disc space narrowing at L4-5 and L5-S1. No suspicious osseous lesions.  IMPRESSION:  1. Extensive bilateral airspace disease superimposed on the patient's chronic interstitial lung disease (see high-resolution chest CT performed in February 2015). Findings could reflect an acute exacerbation of usual interstitial pneumonitis or could reflect acute multifocal bilateral infectious pneumonia superimposed on chronic interstitial lung disease. There is a trace left pleural effusion. 2. Progression of reactive mediastinal lymphadenopathy, likely secondary to the current acute lung pathology. 3. Marked gaseous distention of the stomach. This could place the patient at increased risk for aspiration. Gastric outlet obstruction cannot be completely excluded. Findings were discussed by telephone with the patient's nurse Estill Bamberg, at 7:35 p.m., 06/07/2014. 4. Stable cardiomegaly with biatrial enlargement. 5. Extensive atherosclerosis, including the coronary arteries. 6. Amorphous high density within the gallbladder lumen for which gallbladder sludge and/or small stones cannot be excluded.   Electronically Signed   By: Curlene Dolphin M.D.   On: 06/07/2014 19:44   Ct Chest Wo Contrast  06/07/2014   CLINICAL DATA:  Acute respiratory failure. Evaluate for pneumonia versus edema.  EXAM: CT CHEST, ABDOMEN AND PELVIS WITHOUT CONTRAST  TECHNIQUE: Multidetector CT imaging of the chest, abdomen and pelvis was performed following the standard protocol without IV contrast.  COMPARISON:  Chest radiograph 06/07/2014 and CT chest without contrast and high-resolution images on 12/11/2013  FINDINGS: CT CHEST FINDINGS  Cardiomegaly with biatrial enlargement appears similar to prior chest CT. Negative for pericardial effusion. Atherosclerotic calcification of the normal caliber thoracic aorta, proximal great vessels, left anterior descending coronary artery and right coronary artery are stable. Esophagus is unremarkable. A prevascular lymph node is mildly prominent measuring 7.6 mm (previously 7 mm). Precarinal lymphadenopathy  has progressed since prior chest CT of 12/11/2013. This prominent lymph node currently measures 2.1 cm AP diameter (previously 1.6 cm). Subcarinal lymph node is now 14 mm AP diameter (previously 10 mm).  There is a trace amount of pleural fluid posteriorly on the left. No pleural effusion on the right.  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 There has been a significant interval change in appearance of the lungs since the prior chest CT of 12/11/2013. There are now extensive multifocal bilateral patchy areas of airspace disease, right greater than left, superimposed on the chronic interstitial lung disease. This manifests as extensive ground-glass opacities bilaterally with focal areas of sparing. Lung apices are fairly spared. There is no intralobular septal thickening to suggest pulmonary edema. The trachea can't mainstem bronchi are patent. Negative for pneumothorax. Lung volumes are slightly low. There is some respiratory motion artifact at the level of the mid chest. This results in an artifactually abnormal appearance of the sternum on the reformatted sagittal views. Thoracic spine vertebral bodies are normal in height and alignment.  Densely sclerotic lesion in the posterior right second rib is stable. Densely sclerotic lesion in the posterior right fifth rib is also stable. The sclerotic lesions are well-circumscribed and appear benign.  CT ABDOMEN AND PELVIS FINDINGS  There is marked gaseous distention of the stomach. Gastric wall thickness appears normal. There is an air-fluid level in the fundus of the stomach and another air-fluid level in the pylorus/duodenal bulb. Gastric outlet obstruction cannot be excluded.  Small bowel loops and colon are normal in caliber.  The noncontrast appearance of the liver, spleen, pancreas, and kidneys is within normal  limits. Negative for hydronephrosis. There is bilateral perinephric stranding, nonspecific finding. Both ureters are normal in caliber. No urinary tract stone disease is seen.  There is bilateral thickening of the adrenal glands without discrete nodules or masses.  There is heavy atherosclerotic calcification of the normal caliber abdominal aorta. Scattered atherosclerotic calcification of the iliac vasculature without aneurysm.  Normal appendix.  There is a amorphous high density within the gallbladder lumen. This was not present on prior chest CT. This could reflect gallbladder sludge and/or small gallstones.  Urinary bladder decompressed by Foley catheter. There is some air within the urinary bladder that is likely secondary to the presence of the Foley. Prostate gland appears within normal limits for size and contains some internal calcifications. Small amount of free fluid is noted in the dependent portion the pelvis. Negative for free intraperitoneal air or lymphadenopathy in the abdomen or pelvis.  There is a mild convex left in the curvature of the upper to mid lumbar spine and a mild convex right curvature at the L4-L5 level. There is partial fusion of the L4-L5 vertebral bodies along the left lateral aspect,  nonsurgical. Significant disc space narrowing at L4-5 and L5-S1. No suspicious osseous lesions.  IMPRESSION: 1. Extensive bilateral airspace disease superimposed on the patient's chronic interstitial lung disease (see high-resolution chest CT performed in February 2015). Findings could reflect an acute exacerbation of usual interstitial pneumonitis or could reflect acute multifocal bilateral infectious pneumonia superimposed on chronic interstitial lung disease. There is a trace left pleural effusion. 2. Progression of reactive mediastinal lymphadenopathy, likely secondary to the current acute lung pathology. 3. Marked gaseous distention of the stomach. This could place the patient at increased risk  for aspiration. Gastric outlet obstruction cannot be completely excluded. Findings were discussed by telephone with the patient's nurse Estill Bamberg, at 7:35 p.m., 06/07/2014. 4. Stable cardiomegaly with biatrial enlargement. 5. Extensive atherosclerosis, including the coronary arteries. 6. Amorphous high density within the gallbladder lumen for which gallbladder sludge and/or small stones cannot be excluded.   Electronically Signed   By: Curlene Dolphin M.D.   On: 06/07/2014 19:44   US Abdomen Complete  06/08/2014   CLINICAL DATA:  Elevated liver function tests.  EXAM: ULTRASOUND ABDOMEN COMPLETE  COMPARISON:  CT, 06/07/2014.  FINDINGS: Gallbladder:  Only mildly distended. No convincing stone. No wall thickening or pericholecystic fluid.  Common bile duct:  Diameter: 4.2 mm.  Not seen distally.  Liver:  Mild increased echogenicity. Liver normal in size. No mass or focal lesion. Hepatopetal flow was documented in the portal vein.  IVC:  No abnormality visualized.  Pancreas:  Minimally visualized, mostly obscured by bowel gas. Portions seen are unremarkable.  Spleen:  Not seen.  Left upper quadrant obscured by bowel gas.  Right Kidney:  Length: 11.2 cm. Echogenicity within normal limits. No mass or hydronephrosis visualized.  Left Kidney:  Length: 12.0 cm. Echogenicity within normal limits. No mass or hydronephrosis visualized.  Abdominal aorta:  No convincing aneurysm.  Limited visualization.  Other findings:  None.  IMPRESSION: 1. No acute findings. Exam was limited due to the patient's heavy breathing and increased bowel gas. 2. Probable hepatic steatosis. 3. Pancreas not well visualized.  Spleen not visualized.   Electronically Signed   By: Lajean Manes M.D.   On: 06/08/2014 14:08   Dg Chest Port 1 View  06/09/2014   CLINICAL DATA:  Pneumonia.  EXAM: PORTABLE CHEST - 1 VIEW  COMPARISON:  CT chest and chest radiograph 01/05/2014.  FINDINGS: Trachea is midline. Heart is enlarged. Diffuse bilateral airspace disease  persists. Aeration may have improved slightly in the right lung in the interval. No definite pleural fluid. Left hemidiaphragm is elevated.  IMPRESSION: Persistent diffuse bilateral airspace disease, with mild coarsening. Findings may be due to edema, superimposed on known pulmonary fibrosis.   Electronically Signed   By: Lorin Picket M.D.   On: 06/09/2014 07:44   Dg Chest Portable 1 View  06/07/2014   CLINICAL DATA:  Shortness of breath.  EXAM: PORTABLE CHEST - 1 VIEW  COMPARISON:  05/10/2014 and the CT of 12/11/2013  FINDINGS: Midline trachea. Mild cardiomegaly. Pulmonary artery enlargement. Moderate left hemidiaphragm elevation. No left and no definite right pleural effusion. No pneumothorax. Progressive interstitial prominence, especially on the right. Suspicion of right lower lobe airspace disease. Left upper abdominal gas is likely within prominent transverse and splenic flexure colon.  IMPRESSION: Progressive interstitial prominence, especially on the right. Question usual interstitial pneumonia with superimposed interstitial edema or atypical infection. Consider short term radiographic followup.  Cannot exclude right lower lobe concurrent airspace disease/pneumonia.  At minimum, PA and lateral radiographs should be  considered. Depending on clinical symptomatology, repeat CT may also be informative.  Pulmonary artery enlargement suggests pulmonary arterial hypertension.   Electronically Signed   By: Abigail Miyamoto M.D.   On: 06/07/2014 10:10    Medications:  Prior to Admission:  Prescriptions prior to admission  Medication Sig Dispense Refill  . apixaban (ELIQUIS) 5 MG TABS tablet Take 5 mg by mouth 2 (two) times daily.      Marland Kitchen aspirin EC 81 MG tablet Take 81 mg by mouth daily.      Marland Kitchen atorvastatin (LIPITOR) 10 MG tablet Take 1 tablet (10 mg total) by mouth at bedtime.      Raelyn Ensign Pollen 550 MG CAPS Take 1 capsule by mouth at bedtime.      . fluconazole (DIFLUCAN) 100 MG tablet Take 1 tablet (100  mg total) by mouth daily. First dose 7/27.  3 tablet  0  . insulin detemir (LEVEMIR) 100 UNIT/ML injection Inject 0.2 mLs (20 Units total) into the skin daily.  10 mL  11  . latanoprost (XALATAN) 0.005 % ophthalmic solution Place 1 drop into the left eye at bedtime.      Marland Kitchen levofloxacin (LEVAQUIN) 750 MG tablet Take 1 tablet (750 mg total) by mouth daily at 6 PM.  3 tablet  0  . metFORMIN (GLUCOPHAGE) 500 MG tablet Take 1 tablet (500 mg total) by mouth 2 (two) times daily with a meal.  60 tablet  1  . metoprolol (LOPRESSOR) 25 MG tablet Take 0.5 tablets (12.5 mg total) by mouth 2 (two) times daily.  60 tablet  0  . Multiple Vitamin (MULTIVITAMIN WITH MINERALS) TABS tablet Take 1 tablet by mouth daily.      . tadalafil (CIALIS) 20 MG tablet Take 20 mg by mouth daily as needed for erectile dysfunction.       Scheduled: . antiseptic oral rinse  7 mL Mouth Rinse q12n4p  . atorvastatin  10 mg Oral QHS  . chlorhexidine  15 mL Mouth Rinse BID  . insulin aspart  0-9 Units Subcutaneous 6 times per day  . latanoprost  1 drop Left Eye QHS  . levalbuterol  0.63 mg Nebulization Q6H  . methylPREDNISolone (SOLU-MEDROL) injection  40 mg Intravenous Q6H  . metoprolol tartrate  12.5 mg Oral BID  . pantoprazole  40 mg Oral QAC supper  . piperacillin-tazobactam (ZOSYN)  IV  3.375 g Intravenous Q8H  . vancomycin  1,000 mg Intravenous Q24H   Continuous: . sodium chloride 135 mL/hr at 06/09/14 0600   BTD:[id number], acetaminophen, alum & mag hydroxide-simeth, HYDROcodone-acetaminophen, LORazepam, ondansetron (ZOFRAN) IV, ondansetron, senna-docusate, traZODone  Assesment: He was admitted with acute respiratory failure requiring BiPAP. He has healthcare associated pneumonia which is presumably from aspiration. He has been septic. He is still requiring BiPAP at night. He seems to have generally improved. At baseline he has pulmonary fibrosis. Because of his sepsis he appears to have multi-system injury with  elevated transaminases and acute renal failure Principal Problem:   Sepsis Active Problems:   Hypertension   Atrial fibrillation   Acute on chronic diastolic heart failure   Pulmonary fibrosis   Acute renal failure   DM type 2 (diabetes mellitus, type 2)   HCAP (healthcare-associated pneumonia)   Acute respiratory failure with hypoxia   Hyperkalemia   Elevated transaminase level   Gastric distention    Plan: Continue current treatments. Attempt to get him off of BiPAP again today    LOS: 2 days   Amandamarie Feggins L  06/09/2014, 7:59 AM

## 2014-06-09 NOTE — Progress Notes (Signed)
Pt resting comfortably in no distress vital within normal range spo2 96% on 45% v. Mask rr 17 hor 82 will continue to monitor through out the night , Bipap at bedside if needed

## 2014-06-09 NOTE — Progress Notes (Signed)
[facility] PROGRESS NOTE  [name] Sherman HKV:[id number] DOB: [birth year] DOA: 06/07/2014 PCP: [name], PA-C    Code Status: Full code Family Communication: Discussed with patient. No family at the bedside. Disposition Plan: To be determined.   Consultants:  Pulmonologist, Dr. Juanetta Gosling  Curbside consultation with intensivist, Sandrea Hughs M.D.  Gastroenterology  Procedures:  BiPAP  Antibiotics:  Vancomycin 06/07/14>>   Zosyn 06/07/14>>  HPI/Subjective: Feeling better on BiPAP. Feels that breathing is improving. He reports a productive cough.  Objective: Filed Vitals:   06/09/14 1137  BP:   Pulse:   Temp: 98.6 F (37 C)  Resp:    Intake/Output Summary (Last 24 hours) at 06/09/14 1241 Last data filed at 06/09/14 1100  Gross per 24 hour  Intake 6358.33 ml  Output   1000 ml  Net 5358.33 ml   Filed Weights   06/07/14 1342 06/08/14 0515 06/09/14 0444  Weight: 78.4 kg (172 lb 13.5 oz) 77.7 kg (171 lb 4.8 oz) 76.8 kg (169 lb 5 oz)    Exam:   General:  Pleasant, alert 73 year old African-American man in no acute distress, currently on BiPAP  Cardiovascular: Irregular, irregular. 1+ pedal edema bilaterally  Respiratory: Fine diffuse crackles bilaterally. Breathing mildly labored at rest.  Abdomen: Positive bowel sounds, soft, no appreciable distention or rigidity; nontender.  Musculoskeletal: No acute hot red joints. Pedal pulses palpable.  Neurologic: He is alert and oriented x3. His speech is clear.   Data Reviewed: Basic Metabolic Panel:  Recent Labs Lab 06/07/14 1008 06/07/14 1827 06/08/14 0209 06/08/14 0503 06/09/14 0434  NA 138 135* 134* 139 140  K 5.6* 6.2* 5.1 5.0 4.2  CL 96 96 96 98 101  CO2 14* 19 21 23 28   GLUCOSE 54* 158* 155* 131* 163*  BUN 26* 31* 32* 32* 30*  CREATININE 1.69* 1.74* 1.75* 1.80* 1.56*  CALCIUM 9.7 8.5 8.3* 8.3* 8.0*   Liver Function Tests:  Recent Labs Lab 06/07/14 1118 06/08/14 0503  06/09/14 0434  AST 205* 2096* 718*  ALT 130* 1316* 1071*  ALKPHOS 64 51 50  BILITOT 2.1* 1.9* 2.6*  PROT 7.8 6.6 5.9*  ALBUMIN 3.7 3.2* 2.8*   No results found for this basename: LIPASE, AMYLASE,  in the last 168 hours No results found for this basename: AMMONIA,  in the last 168 hours CBC:  Recent Labs Lab 06/07/14 1008 06/08/14 0503 06/09/14 0434  WBC 13.9* 8.4 9.5  HGB 15.8 14.0 13.9  HCT 50.1 43.4 42.6  MCV 110.4* 106.1* 103.6*  PLT 161 144* 156   Cardiac Enzymes:  Recent Labs Lab 06/07/14 1008  TROPONINI <0.30   BNP (last 3 results)  Recent Labs  04/02/14 0525 04/30/14 1115 06/07/14 1008  PROBNP 1543.0* 1929.0* 10442.0*   CBG:  Recent Labs Lab 06/08/14 1952 06/09/14 0022 06/09/14 0440 06/09/14 0814 06/09/14 1134  GLUCAP 237* 160* 152* 161* 175*    Recent Results (from the past 240 hour(s))  CULTURE, BLOOD (ROUTINE X 2)     Status: None   Collection Time    06/07/14 10:45 AM      Result Value Ref Range Status   Specimen Description BLOOD RIGHT FOREARM DRAWN BY RN AMANDA   Final   Special Requests BOTTLES DRAWN AEROBIC ONLY 4CC   Final   Culture NO GROWTH 2 DAYS   Final   Report Status PENDING   Incomplete  CULTURE, BLOOD (ROUTINE X 2)     Status: None   Collection Time    06/07/14  11:18 AM      Result Value Ref Range Status   Specimen Description BLOOD LEFT ARM   Final   Special Requests BOTTLES DRAWN AEROBIC AND ANAEROBIC 6CC   Final   Culture NO GROWTH 2 DAYS   Final   Report Status PENDING   Incomplete  URINE CULTURE     Status: None   Collection Time    06/07/14 12:30 PM      Result Value Ref Range Status   Specimen Description URINE, CATHETERIZED   Final   Special Requests NONE   Final   Culture  Setup Time     Final   Value: 06/07/2014 20:28     Performed at [facility] Count     Final   Value: NO GROWTH     Performed at [facility]   Culture     Final   Value: NO GROWTH     Performed at [facility]   Report Status 06/08/2014 FINAL   Final  MRSA PCR SCREENING     Status: None   Collection Time    06/07/14  2:05 PM      Result Value Ref Range Status   MRSA by PCR NEGATIVE  NEGATIVE Final   Comment:            The GeneXpert MRSA Assay (FDA     approved for NASAL specimens     only), is one component of a     comprehensive MRSA colonization     surveillance program. It is not     intended to diagnose MRSA     infection nor to guide or     monitor treatment for     MRSA infections.  CULTURE, EXPECTORATED SPUTUM-ASSESSMENT     Status: None   Collection Time    06/08/14  2:00 PM      Result Value Ref Range Status   Specimen Description SPUTUM EXPECTORATED   Final   Special Requests NONE   Final   Sputum evaluation     Final   Value: THIS SPECIMEN IS ACCEPTABLE. RESPIRATORY CULTURE REPORT TO FOLLOW.     Performed at [facility]   Report Status 06/09/2014 FINAL   Final  CULTURE, RESPIRATORY (NON-EXPECTORATED)     Status: None   Collection Time    06/08/14  2:00 PM      Result Value Ref Range Status   Specimen Description SPUTUM EXPECTORATED   Final   Special Requests NONE   Final   Gram Stain     Final   Value: MODERATE WBC PRESENT,BOTH PMN AND MONONUCLEAR     FEW SQUAMOUS EPITHELIAL CELLS PRESENT     RARE GRAM POSITIVE COCCI     IN PAIRS FEW YEAST     Performed at [facility]   Culture PENDING   Incomplete   Report Status PENDING   Incomplete     Studies: Ct Abdomen Pelvis Wo Contrast  06/07/2014   CLINICAL DATA:  Acute respiratory failure. Evaluate for pneumonia versus edema.  EXAM: CT CHEST, ABDOMEN AND PELVIS WITHOUT CONTRAST  TECHNIQUE: Multidetector CT imaging of the chest, abdomen and pelvis was performed following the standard protocol without IV contrast.  COMPARISON:  Chest radiograph 06/07/2014 and CT chest without contrast and high-resolution images on 12/11/2013  FINDINGS: CT CHEST FINDINGS  Cardiomegaly with biatrial enlargement  appears similar to prior chest CT. Negative for pericardial effusion. Atherosclerotic calcification of the normal  caliber thoracic aorta, proximal great vessels, left anterior descending coronary artery and right coronary artery are stable. Esophagus is unremarkable. A prevascular lymph node is mildly prominent measuring 7.6 mm (previously 7 mm). Precarinal lymphadenopathy has progressed since prior chest CT of 12/11/2013. This prominent lymph node currently measures 2.1 cm AP diameter (previously 1.6 cm). Subcarinal lymph node is now 14 mm AP diameter (previously 10 mm).  There is a trace amount of pleural fluid posteriorly on the left. No pleural effusion on the right.  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 There has been a significant interval change in appearance of the lungs since the prior chest CT of 12/11/2013. There are now extensive multifocal bilateral patchy areas of airspace disease, right greater than left, superimposed on the chronic interstitial lung disease. This manifests as extensive ground-glass opacities bilaterally with focal areas of sparing. Lung apices are fairly spared. There is no intralobular septal thickening to suggest pulmonary edema. The trachea can't mainstem bronchi are patent. Negative for pneumothorax. Lung volumes are slightly low. There is some respiratory motion artifact at the level of the mid chest. This results in an artifactually abnormal appearance of the sternum on the reformatted sagittal views. Thoracic spine vertebral bodies are normal in height and alignment.  Densely sclerotic lesion in the posterior right second rib is stable. Densely sclerotic lesion in the posterior right fifth rib is also stable. The sclerotic lesions are well-circumscribed and appear benign.  CT ABDOMEN AND PELVIS FINDINGS  There is marked gaseous  distention of the stomach. Gastric wall thickness appears normal. There is an air-fluid level in the fundus of the stomach and another air-fluid level in the pylorus/duodenal bulb. Gastric outlet obstruction cannot be excluded.  Small bowel loops and colon are normal in caliber.  The noncontrast appearance of the liver, spleen, pancreas, and kidneys is within normal limits. Negative for hydronephrosis. There is bilateral perinephric stranding, nonspecific finding. Both ureters are normal in caliber. No urinary tract stone disease is seen.  There is bilateral thickening of the adrenal glands without discrete nodules or masses.  There is heavy atherosclerotic calcification of the normal caliber abdominal aorta. Scattered atherosclerotic calcification of the iliac vasculature without aneurysm.  Normal appendix.  There is a amorphous high density within the gallbladder lumen. This was not present on prior chest CT. This could reflect gallbladder sludge and/or small gallstones.  Urinary bladder decompressed by Foley catheter. There is some air within the urinary bladder that is likely secondary to the presence of the Foley. Prostate gland appears within normal limits for size and contains some internal calcifications. Small amount of free fluid is noted in the dependent portion the pelvis. Negative for free intraperitoneal air or lymphadenopathy in the abdomen or pelvis.  There is a mild convex left in the curvature of the upper to mid lumbar spine and a mild convex right curvature at the L4-L5 level. There is partial fusion of the L4-L5 vertebral bodies along the left lateral aspect, nonsurgical. Significant disc space narrowing at L4-5 and L5-S1. No suspicious osseous lesions.  IMPRESSION: 1. Extensive bilateral airspace disease superimposed on the patient's chronic interstitial lung disease (see high-resolution chest CT performed in February 2015). Findings could reflect an acute exacerbation of usual interstitial  pneumonitis or could reflect acute multifocal bilateral infectious pneumonia superimposed on chronic interstitial lung disease. There is a trace left pleural effusion. 2. Progression  of reactive mediastinal lymphadenopathy, likely secondary to the current acute lung pathology. 3. Marked gaseous distention of the stomach. This could place the patient at increased risk for aspiration. Gastric outlet obstruction cannot be completely excluded. Findings were discussed by telephone with the patient's nurse Marchelle Folks, at 7:35 p.m., 06/07/2014. 4. Stable cardiomegaly with biatrial enlargement. 5. Extensive atherosclerosis, including the coronary arteries. 6. Amorphous high density within the gallbladder lumen for which gallbladder sludge and/or small stones cannot be excluded.   Electronically Signed   By: Britta Mccreedy M.D.   On: 06/07/2014 19:44   Ct Chest Wo Contrast  06/07/2014   CLINICAL DATA:  Acute respiratory failure. Evaluate for pneumonia versus edema.  EXAM: CT CHEST, ABDOMEN AND PELVIS WITHOUT CONTRAST  TECHNIQUE: Multidetector CT imaging of the chest, abdomen and pelvis was performed following the standard protocol without IV contrast.  COMPARISON:  Chest radiograph 06/07/2014 and CT chest without contrast and high-resolution images on 12/11/2013  FINDINGS: CT CHEST FINDINGS  Cardiomegaly with biatrial enlargement appears similar to prior chest CT. Negative for pericardial effusion. Atherosclerotic calcification of the normal caliber thoracic aorta, proximal great vessels, left anterior descending coronary artery and right coronary artery are stable. Esophagus is unremarkable. A prevascular lymph node is mildly prominent measuring 7.6 mm (previously 7 mm). Precarinal lymphadenopathy has progressed since prior chest CT of 12/11/2013. This prominent lymph node currently measures 2.1 cm AP diameter (previously 1.6 cm). Subcarinal lymph node is now 14 mm AP diameter (previously 10 mm).  There is a trace amount of  pleural fluid posteriorly on the left. No pleural effusion on the right.  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 There has been a significant interval change in appearance of the lungs since the prior chest CT of 12/11/2013. There are now extensive multifocal bilateral patchy areas of airspace disease, right greater than left, superimposed on the chronic interstitial lung disease. This manifests as extensive ground-glass opacities bilaterally with focal areas of sparing. Lung apices are fairly spared. There is no intralobular septal thickening to suggest pulmonary edema. The trachea can't mainstem bronchi are patent. Negative for pneumothorax. Lung volumes are slightly low. There is some respiratory motion artifact at the level of the mid chest. This results in an artifactually abnormal appearance of the sternum on the reformatted sagittal views. Thoracic spine vertebral bodies are normal in height and alignment.  Densely sclerotic lesion in the posterior right second rib is stable. Densely sclerotic lesion in the posterior right fifth rib is also stable. The sclerotic lesions are well-circumscribed and appear benign.  CT ABDOMEN AND PELVIS FINDINGS  There is marked gaseous distention of the stomach. Gastric wall thickness appears normal. There is an air-fluid level in the fundus of the stomach and another air-fluid level in the pylorus/duodenal bulb. Gastric outlet obstruction cannot be excluded.  Small bowel loops and colon are normal in caliber.  The noncontrast appearance of the liver, spleen, pancreas, and kidneys is within normal limits. Negative for hydronephrosis. There is bilateral perinephric stranding, nonspecific finding. Both ureters are normal in caliber. No urinary tract stone disease is seen.  There is bilateral thickening of the adrenal glands without  discrete nodules or masses.  There is heavy atherosclerotic calcification of the normal caliber abdominal aorta. Scattered atherosclerotic calcification of the iliac vasculature without aneurysm.  Normal appendix.  There is a amorphous high density within the gallbladder lumen. This  was not present on prior chest CT. This could reflect gallbladder sludge and/or small gallstones.  Urinary bladder decompressed by Foley catheter. There is some air within the urinary bladder that is likely secondary to the presence of the Foley. Prostate gland appears within normal limits for size and contains some internal calcifications. Small amount of free fluid is noted in the dependent portion the pelvis. Negative for free intraperitoneal air or lymphadenopathy in the abdomen or pelvis.  There is a mild convex left in the curvature of the upper to mid lumbar spine and a mild convex right curvature at the L4-L5 level. There is partial fusion of the L4-L5 vertebral bodies along the left lateral aspect, nonsurgical. Significant disc space narrowing at L4-5 and L5-S1. No suspicious osseous lesions.  IMPRESSION: 1. Extensive bilateral airspace disease superimposed on the patient's chronic interstitial lung disease (see high-resolution chest CT performed in February 2015). Findings could reflect an acute exacerbation of usual interstitial pneumonitis or could reflect acute multifocal bilateral infectious pneumonia superimposed on chronic interstitial lung disease. There is a trace left pleural effusion. 2. Progression of reactive mediastinal lymphadenopathy, likely secondary to the current acute lung pathology. 3. Marked gaseous distention of the stomach. This could place the patient at increased risk for aspiration. Gastric outlet obstruction cannot be completely excluded. Findings were discussed by telephone with the patient's nurse Marchelle Folks, at 7:35 p.m., 06/07/2014. 4. Stable cardiomegaly with biatrial enlargement. 5. Extensive  atherosclerosis, including the coronary arteries. 6. Amorphous high density within the gallbladder lumen for which gallbladder sludge and/or small stones cannot be excluded.   Electronically Signed   By: Britta Mccreedy M.D.   On: 06/07/2014 19:44   US Abdomen Complete  06/08/2014   CLINICAL DATA:  Elevated liver function tests.  EXAM: ULTRASOUND ABDOMEN COMPLETE  COMPARISON:  CT, 06/07/2014.  FINDINGS: Gallbladder:  Only mildly distended. No convincing stone. No wall thickening or pericholecystic fluid.  Common bile duct:  Diameter: 4.2 mm.  Not seen distally.  Liver:  Mild increased echogenicity. Liver normal in size. No mass or focal lesion. Hepatopetal flow was documented in the portal vein.  IVC:  No abnormality visualized.  Pancreas:  Minimally visualized, mostly obscured by bowel gas. Portions seen are unremarkable.  Spleen:  Not seen.  Left upper quadrant obscured by bowel gas.  Right Kidney:  Length: 11.2 cm. Echogenicity within normal limits. No mass or hydronephrosis visualized.  Left Kidney:  Length: 12.0 cm. Echogenicity within normal limits. No mass or hydronephrosis visualized.  Abdominal aorta:  No convincing aneurysm.  Limited visualization.  Other findings:  None.  IMPRESSION: 1. No acute findings. Exam was limited due to the patient's heavy breathing and increased bowel gas. 2. Probable hepatic steatosis. 3. Pancreas not well visualized.  Spleen not visualized.   Electronically Signed   By: Amie Portland M.D.   On: 06/08/2014 14:08   Dg Chest Port 1 View  06/09/2014   CLINICAL DATA:  Pneumonia.  EXAM: PORTABLE CHEST - 1 VIEW  COMPARISON:  CT chest and chest radiograph 01/05/2014.  FINDINGS: Trachea is midline. Heart is enlarged. Diffuse bilateral airspace disease persists. Aeration may have improved slightly in the right lung in the interval. No definite pleural fluid. Left hemidiaphragm is elevated.  IMPRESSION: Persistent diffuse bilateral airspace disease, with mild coarsening. Findings may  be due to edema, superimposed on known pulmonary fibrosis.   Electronically Signed   By: Leanna Battles M.D.   On: 06/09/2014 07:44    Scheduled Meds: . antiseptic  oral rinse  7 mL Mouth Rinse q12n4p  . chlorhexidine  15 mL Mouth Rinse BID  . furosemide  40 mg Intravenous BID  . insulin aspart  0-9 Units Subcutaneous 6 times per day  . latanoprost  1 drop Left Eye QHS  . levalbuterol  0.63 mg Nebulization Q6H  . methylPREDNISolone (SOLU-MEDROL) injection  40 mg Intravenous Q6H  . metoprolol tartrate  12.5 mg Oral BID  . pantoprazole  40 mg Oral QAC supper  . piperacillin-tazobactam (ZOSYN)  IV  3.375 g Intravenous Q8H  . vancomycin  1,000 mg Intravenous Q24H   Continuous Infusions: . sodium chloride 10 mL/hr at 06/09/14 0940   Assessment and plan:  Principal Problem:   Sepsis Active Problems:   Hypertension   Atrial fibrillation   Acute on chronic diastolic heart failure   Pulmonary fibrosis   Acute renal failure   DM type 2 (diabetes mellitus, type 2)   HCAP (healthcare-associated pneumonia)   Acute respiratory failure with hypoxia   Hyperkalemia   Elevated transaminase level   Gastric distention    1. Sepsis with lactic acidosis, presumed to be secondary to healthcare acquired pneumonia and/or aspiration pneumonia. Blood pressure has remained stable and he has not required pressors. His white blood cell count has improved. He was intermittently hypothermic and mildly febrile, but his temperature is within normal limits. Blood/urine cultures have not shown any growth. Sputum culture is currently in process. Continue current antibiotics 2. Possible superimposed pneumonitis versus aspiration pneumonitis.  We'll continue antibiotics as above. Also on IV Pepcid. 3. Severe pulmonary fibrosis. He is followed by Dr. Juanetta Gosling. Dr. Juanetta Gosling plans to refer the patient to the pulmonary fibrosis clinic at [facility]. He was recently started on a new medication for pulmonary fibrosis, OFEV.  The patient had not been feeling well since starting this medication. It will be held. Dr. Juanetta Gosling was consulted and recommended starting gentle steroids. 4. Acute on Chronic diastolic heart failure with possible acute diastolic failure. Patient has elevated BNP and chest x-ray may indicate underlying pulmonary edema. Clinically he does have evidence of peripheral edema. We'll discontinue IV fluids at this time and start IV Lasix. Echocardiogram shows preserved ejection fraction. -Acute respiratory failure with hypoxia. Etiology multifactorial including HC AP/possible aspiration pneumonia/pneumonitis/severe pulmonary edema/and chronic heart failure. We'll continue BiPAP and oxygen as started. Followup ABG improved and reassuring. 5. Query gastric outlet obstruction/marked gaseous distention of the stomach on CT. He's not having any abdominal pain, nausea, vomiting. Seen by gastroenterology and plans are for endoscopy once respiratory status stabilizes. 6. Elevated LFTs, hepatitis. His AST and ALT have increased to the thousands. Etiology unclear. Viral markers were unremarkable an abdominal ultrasound did not have any significant findings. He has known fatty liver disease. Statin has been discontinued. Gastroenterology is consulted. May also be related to ischemia in setting of sepsis. Continue to follow 7. Acute renal failure. This is likely secondary to ATN and/or prerenal azotemia from his illness. Currently, he does have signs of volume overload. We will continue Lasix and follow renal function closely. 8. Chronic atrial fibrillation His rate is currently controlled. Will continue low dosing of metoprolol. Anticoagulation currently on hold in anticipation of EGD. Aspirin discontinued. 9. Diabetes mellitus, type II; hypoglycemic on admission. Status post D50 in the ED. His blood glucose is better. We'll start sliding scale NovoLog with the recent start of IV steroids.Maryclare Labrador continue to  monitor.  Time spent:    Mayan Kloepfer  [facility] Pager [phone]. If 7PM-7AM,  please contact night-coverage at [email or web address], password [facility] 06/09/2014, 12:41 PM  LOS: 2 days

## 2014-06-09 NOTE — Progress Notes (Signed)
Pt on 50% v mask

## 2014-06-09 NOTE — Progress Notes (Signed)
Subjective: Denies abdominal pain, N/V. On Bipap. States he feels his breathing is better.   Objective: Vital signs in last 24 hours: Temp:  [97.2 F (36.2 C)-98.6 F (37 C)] 97.6 F (36.4 C) (08/26 0400) Pulse Rate:  [25-94] 87 (08/26 0708) Resp:  [14-33] 16 (08/26 0708) BP: (79-139)/(46-93) 125/69 mmHg (08/26 0708) SpO2:  [71 %-100 %] 97 % (08/26 0709) FiO2 (%):  [40 %-50 %] 40 % (08/26 0709) Weight:  [169 lb 5 oz (76.8 kg)] 169 lb 5 oz (76.8 kg) (08/26 0444) Last BM Date: 06/07/14 General:   Alert and oriented, pleasant Head:  Normocephalic and atraumatic. Abdomen:  Bowel sounds present, soft, non-tender, non-distended. No HSM or hernias noted. No rebound or guarding. No masses appreciated  Extremities:  Without edema. Neurologic:  Alert and  oriented x4   Intake/Output from previous day: 08/25 0701 - 08/26 0700 In: 6725 [P.O.:960; I.V.:2965; IV Piggyback:400] Out: 1000 [Urine:1000] Intake/Output this shift:    Lab Results:  Recent Labs  06/07/14 1008 06/08/14 0503 06/09/14 0434  WBC 13.9* 8.4 9.5  HGB 15.8 14.0 13.9  HCT 50.1 43.4 42.6  PLT 161 144* 156   BMET  Recent Labs  06/08/14 0209 06/08/14 0503 06/09/14 0434  NA 134* 139 140  K 5.1 5.0 4.2  CL 96 98 101  CO2 GLUCOSE 155* 131* 163*  BUN 32* 32* 30*  CREATININE 1.75* 1.80* 1.56*  CALCIUM 8.3* 8.3* 8.0*   LFT  Recent Labs  06/07/14 1118 06/08/14 0503 06/09/14 0434  PROT 7.8 6.6 5.9*  ALBUMIN 3.7 3.2* 2.8*  AST 205* 2096* 718*  ALT 130* 1316* 1071*  ALKPHOS 64 51 50  BILITOT 2.1* 1.9* 2.6*  BILIDIR 0.9*  --  1.1*  IBILI 1.2*  --  1.5*   Hepatitis Panel  Recent Labs  06/07/14 1108  HEPBSAG NEGATIVE  HCVAB NEGATIVE  HEPAIGM NON REACTIVE  HEPBIGM NON REACTIVE     Studies/Results: Ct Abdomen Pelvis Wo Contrast  06/07/2014   CLINICAL DATA:  Acute respiratory failure. Evaluate for pneumonia versus edema.  EXAM: CT CHEST, ABDOMEN AND PELVIS WITHOUT CONTRAST   TECHNIQUE: Multidetector CT imaging of the chest, abdomen and pelvis was performed following the standard protocol without IV contrast.  COMPARISON:  Chest radiograph 06/07/2014 and CT chest without contrast and high-resolution images on 12/11/2013  FINDINGS: CT CHEST FINDINGS  Cardiomegaly with biatrial enlargement appears similar to prior chest CT. Negative for pericardial effusion. Atherosclerotic calcification of the normal caliber thoracic aorta, proximal great vessels, left anterior descending coronary artery and right coronary artery are stable. Esophagus is unremarkable. A prevascular lymph node is mildly prominent measuring 7.6 mm (previously 7 mm). Precarinal lymphadenopathy has progressed since prior chest CT of 12/11/2013. This prominent lymph node currently measures 2.1 cm AP diameter (previously 1.6 cm). Subcarinal lymph node is now 14 mm AP diameter (previously 10 mm).  There is a trace amount of pleural fluid posteriorly on the left. No pleural effusion on the right.  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 There has been a significant interval change in appearance of the lungs since the prior chest CT of 12/11/2013. There are now extensive multifocal bilateral patchy areas of airspace disease, right greater than left, superimposed on the chronic interstitial lung disease. This manifests as extensive ground-glass opacities bilaterally with focal areas of sparing. Lung apices are  fairly spared. There is no intralobular septal thickening to suggest pulmonary edema. The trachea can't mainstem bronchi are patent. Negative for pneumothorax. Lung volumes are slightly low. There is some respiratory motion artifact at the level of the mid chest. This results in an artifactually abnormal appearance of the sternum on the reformatted sagittal views. Thoracic spine  vertebral bodies are normal in height and alignment.  Densely sclerotic lesion in the posterior right second rib is stable. Densely sclerotic lesion in the posterior right fifth rib is also stable. The sclerotic lesions are well-circumscribed and appear benign.  CT ABDOMEN AND PELVIS FINDINGS  There is marked gaseous distention of the stomach. Gastric wall thickness appears normal. There is an air-fluid level in the fundus of the stomach and another air-fluid level in the pylorus/duodenal bulb. Gastric outlet obstruction cannot be excluded.  Small bowel loops and colon are normal in caliber.  The noncontrast appearance of the liver, spleen, pancreas, and kidneys is within normal limits. Negative for hydronephrosis. There is bilateral perinephric stranding, nonspecific finding. Both ureters are normal in caliber. No urinary tract stone disease is seen.  There is bilateral thickening of the adrenal glands without discrete nodules or masses.  There is heavy atherosclerotic calcification of the normal caliber abdominal aorta. Scattered atherosclerotic calcification of the iliac vasculature without aneurysm.  Normal appendix.  There is a amorphous high density within the gallbladder lumen. This was not present on prior chest CT. This could reflect gallbladder sludge and/or small gallstones.  Urinary bladder decompressed by Foley catheter. There is some air within the urinary bladder that is likely secondary to the presence of the Foley. Prostate gland appears within normal limits for size and contains some internal calcifications. Small amount of free fluid is noted in the dependent portion the pelvis. Negative for free intraperitoneal air or lymphadenopathy in the abdomen or pelvis.  There is a mild convex left in the curvature of the upper to mid lumbar spine and a mild convex right curvature at the L4-L5 level. There is partial fusion of the L4-L5 vertebral bodies along the left lateral aspect, nonsurgical.  Significant disc space narrowing at L4-5 and L5-S1. No suspicious osseous lesions.  IMPRESSION: 1. Extensive bilateral airspace disease superimposed on the patient's chronic interstitial lung disease (see high-resolution chest CT performed in February 2015). Findings could reflect an acute exacerbation of usual interstitial pneumonitis or could reflect acute multifocal bilateral infectious pneumonia superimposed on chronic interstitial lung disease. There is a trace left pleural effusion. 2. Progression of reactive mediastinal lymphadenopathy, likely secondary to the current acute lung pathology. 3. Marked gaseous distention of the stomach. This could place the patient at increased risk for aspiration. Gastric outlet obstruction cannot be completely excluded. Findings were discussed by telephone with the patient's nurse Marchelle Folks, at 7:35 p.m., 06/07/2014. 4. Stable cardiomegaly with biatrial enlargement. 5. Extensive atherosclerosis, including the coronary arteries. 6. Amorphous high density within the gallbladder lumen for which gallbladder sludge and/or small stones cannot be excluded.   Electronically Signed   By: Britta Mccreedy M.D.   On: 06/07/2014 19:44   Ct Chest Wo Contrast  06/07/2014   CLINICAL DATA:  Acute respiratory failure. Evaluate for pneumonia versus edema.  EXAM: CT CHEST, ABDOMEN AND PELVIS WITHOUT CONTRAST  TECHNIQUE: Multidetector CT imaging of the chest, abdomen and pelvis was performed following the standard protocol without IV contrast.  COMPARISON:  Chest radiograph 06/07/2014 and CT chest without contrast and high-resolution images on 12/11/2013  FINDINGS: CT CHEST FINDINGS  Cardiomegaly  with biatrial enlargement appears similar to prior chest CT. Negative for pericardial effusion. Atherosclerotic calcification of the normal caliber thoracic aorta, proximal great vessels, left anterior descending coronary artery and right coronary artery are stable. Esophagus is unremarkable. A prevascular  lymph node is mildly prominent measuring 7.6 mm (previously 7 mm). Precarinal lymphadenopathy has progressed since prior chest CT of 12/11/2013. This prominent lymph node currently measures 2.1 cm AP diameter (previously 1.6 cm). Subcarinal lymph node is now 14 mm AP diameter (previously 10 mm).  There is a trace amount of pleural fluid posteriorly on the left. No pleural effusion on the right.  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 There has been a significant interval change in appearance of the lungs since the prior chest CT of 12/11/2013. There are now extensive multifocal bilateral patchy areas of airspace disease, right greater than left, superimposed on the chronic interstitial lung disease. This manifests as extensive ground-glass opacities bilaterally with focal areas of sparing. Lung apices are fairly spared. There is no intralobular septal thickening to suggest pulmonary edema. The trachea can't mainstem bronchi are patent. Negative for pneumothorax. Lung volumes are slightly low. There is some respiratory motion artifact at the level of the mid chest. This results in an artifactually abnormal appearance of the sternum on the reformatted sagittal views. Thoracic spine vertebral bodies are normal in height and alignment.  Densely sclerotic lesion in the posterior right second rib is stable. Densely sclerotic lesion in the posterior right fifth rib is also stable. The sclerotic lesions are well-circumscribed and appear benign.  CT ABDOMEN AND PELVIS FINDINGS  There is marked gaseous distention of the stomach. Gastric wall thickness appears normal. There is an air-fluid level in the fundus of the stomach and another air-fluid level in the pylorus/duodenal bulb. Gastric outlet obstruction cannot be excluded.  Small bowel loops and colon are normal in caliber.   The noncontrast appearance of the liver, spleen, pancreas, and kidneys is within normal limits. Negative for hydronephrosis. There is bilateral perinephric stranding, nonspecific finding. Both ureters are normal in caliber. No urinary tract stone disease is seen.  There is bilateral thickening of the adrenal glands without discrete nodules or masses.  There is heavy atherosclerotic calcification of the normal caliber abdominal aorta. Scattered atherosclerotic calcification of the iliac vasculature without aneurysm.  Normal appendix.  There is a amorphous high density within the gallbladder lumen. This was not present on prior chest CT. This could reflect gallbladder sludge and/or small gallstones.  Urinary bladder decompressed by Foley catheter. There is some air within the urinary bladder that is likely secondary to the presence of the Foley. Prostate gland appears within normal limits for size and contains some internal calcifications. Small amount of free fluid is noted in the dependent portion the pelvis. Negative for free intraperitoneal air or lymphadenopathy in the abdomen or pelvis.  There is a mild convex left in the curvature of the upper to mid lumbar spine and a mild convex right curvature at the L4-L5 level. There is partial fusion of the L4-L5 vertebral bodies along the left lateral aspect, nonsurgical. Significant disc space narrowing at L4-5 and L5-S1. No suspicious osseous lesions.  IMPRESSION: 1. Extensive bilateral airspace disease superimposed on the patient's chronic interstitial lung disease (see high-resolution chest CT performed in February 2015). Findings could reflect an acute exacerbation of usual interstitial pneumonitis or could reflect acute multifocal  bilateral infectious pneumonia superimposed on chronic interstitial lung disease. There is a trace left pleural effusion. 2. Progression of reactive mediastinal lymphadenopathy, likely secondary to the current acute lung pathology. 3.  Marked gaseous distention of the stomach. This could place the patient at increased risk for aspiration. Gastric outlet obstruction cannot be completely excluded. Findings were discussed by telephone with the patient's nurse Marchelle Folks, at 7:35 p.m., 06/07/2014. 4. Stable cardiomegaly with biatrial enlargement. 5. Extensive atherosclerosis, including the coronary arteries. 6. Amorphous high density within the gallbladder lumen for which gallbladder sludge and/or small stones cannot be excluded.   Electronically Signed   By: Britta Mccreedy M.D.   On: 06/07/2014 19:44   US Abdomen Complete  06/08/2014   CLINICAL DATA:  Elevated liver function tests.  EXAM: ULTRASOUND ABDOMEN COMPLETE  COMPARISON:  CT, 06/07/2014.  FINDINGS: Gallbladder:  Only mildly distended. No convincing stone. No wall thickening or pericholecystic fluid.  Common bile duct:  Diameter: 4.2 mm.  Not seen distally.  Liver:  Mild increased echogenicity. Liver normal in size. No mass or focal lesion. Hepatopetal flow was documented in the portal vein.  IVC:  No abnormality visualized.  Pancreas:  Minimally visualized, mostly obscured by bowel gas. Portions seen are unremarkable.  Spleen:  Not seen.  Left upper quadrant obscured by bowel gas.  Right Kidney:  Length: 11.2 cm. Echogenicity within normal limits. No mass or hydronephrosis visualized.  Left Kidney:  Length: 12.0 cm. Echogenicity within normal limits. No mass or hydronephrosis visualized.  Abdominal aorta:  No convincing aneurysm.  Limited visualization.  Other findings:  None.  IMPRESSION: 1. No acute findings. Exam was limited due to the patient's heavy breathing and increased bowel gas. 2. Probable hepatic steatosis. 3. Pancreas not well visualized.  Spleen not visualized.   Electronically Signed   By: Amie Portland M.D.   On: 06/08/2014 14:08   Dg Chest Port 1 View  06/09/2014   CLINICAL DATA:  Pneumonia.  EXAM: PORTABLE CHEST - 1 VIEW  COMPARISON:  CT chest and chest radiograph  01/05/2014.  FINDINGS: Trachea is midline. Heart is enlarged. Diffuse bilateral airspace disease persists. Aeration may have improved slightly in the right lung in the interval. No definite pleural fluid. Left hemidiaphragm is elevated.  IMPRESSION: Persistent diffuse bilateral airspace disease, with mild coarsening. Findings may be due to edema, superimposed on known pulmonary fibrosis.   Electronically Signed   By: Leanna Battles M.D.   On: 06/09/2014 07:44   Dg Chest Portable 1 View  06/07/2014   CLINICAL DATA:  Shortness of breath.  EXAM: PORTABLE CHEST - 1 VIEW  COMPARISON:  05/10/2014 and the CT of 12/11/2013  FINDINGS: Midline trachea. Mild cardiomegaly. Pulmonary artery enlargement. Moderate left hemidiaphragm elevation. No left and no definite right pleural effusion. No pneumothorax. Progressive interstitial prominence, especially on the right. Suspicion of right lower lobe airspace disease. Left upper abdominal gas is likely within prominent transverse and splenic flexure colon.  IMPRESSION: Progressive interstitial prominence, especially on the right. Question usual interstitial pneumonia with superimposed interstitial edema or atypical infection. Consider short term radiographic followup.  Cannot exclude right lower lobe concurrent airspace disease/pneumonia.  At minimum, PA and lateral radiographs should be considered. Depending on clinical symptomatology, repeat CT may also be informative.  Pulmonary artery enlargement suggests pulmonary arterial hypertension.   Electronically Signed   By: Jeronimo Greaves M.D.   On: 06/07/2014 10:10    Assessment: 73 year old male admitted with sepsis secondary to healthcare acquired pneumonia with concern  for aspiration pneumonia in the setting of severe pulmonary fibrosis; found to have marked gaseous distension of the stomach on CT but asymptomatic. Increased risk for aspiration. Recently on Ofev as outpatient for pulmonary fibrosis, which has several adverse  GI side effects and can also cause significant elevations in transaminases. Eliquis for history of afib: discontinued 8/25 with last dose 8/25 in the morning.   Eliquis will need to be held for 48 hours prior to any endoscopic procedure. Respiratory status would need to be improved/stable prior to endoscopic procedure. On bipap currently, with plans to wean today.   Elevated LFTs: transaminases bumped to thousands. Now with improvement today. Viral markers negative and [place] of abdomen without acute findings. Known history of fatty liver.  Ofev known to cause elevated transaminases; this has been held. With clinical presentation question bystander reaction, ischemic hepatitis, med effect. Asymptomatic. Check INR and follow LFTs.   Plan: Continue to hold Eliquis. Earliest EGD would be Thursday, 8/27, pending respiratory status. May need to put on hold if unable to remain on bipap No NG tube needed at this time Check INR, follow LFTs PPI for GI prophylaxis  Nira Retort, ANP-BC [facility]    LOS: 2 days    06/09/2014, 7:49 AM  Attending note:  Patient seen and examined this afternoon. Patient in no GI distress, whatsoever. We'll reevaluate tomorrow morning.

## 2014-06-10 LAB — BASIC METABOLIC PANEL
Anion gap: 12 (ref 5–15)
BUN: 27 mg/dL — ABNORMAL HIGH (ref 6–23)
CO2: 29 mEq/L (ref 19–32)
Calcium: 8.5 mg/dL (ref 8.4–10.5)
Chloride: 100 mEq/L (ref 96–112)
Creatinine, Ser: 1.45 mg/dL — ABNORMAL HIGH (ref 0.50–1.35)
GFR calc Af Amer: 53 mL/min — ABNORMAL LOW (ref >90)
GFR, EST NON AFRICAN AMERICAN: 46 mL/min — AB (ref >90)
GLUCOSE: 183 mg/dL — AB (ref 70–99)
POTASSIUM: 4.5 meq/L (ref 3.7–5.3)
SODIUM: 141 meq/L (ref 137–147)

## 2014-06-10 LAB — GLUCOSE, CAPILLARY
GLUCOSE-CAPILLARY: 196 mg/dL — AB (ref 70–99)
GLUCOSE-CAPILLARY: 203 mg/dL — AB (ref 70–99)
GLUCOSE-CAPILLARY: 249 mg/dL — AB (ref 70–99)
GLUCOSE-CAPILLARY: 291 mg/dL — AB (ref 70–99)
Glucose-Capillary: 170 mg/dL — ABNORMAL HIGH (ref 70–99)
Glucose-Capillary: 137 mg/dL — ABNORMAL HIGH (ref 70–99)

## 2014-06-10 LAB — HEPATIC FUNCTION PANEL
ALT: 1124 U/L — ABNORMAL HIGH (ref 0–53)
AST: 468 U/L — ABNORMAL HIGH (ref 0–37)
Albumin: 2.8 g/dL — ABNORMAL LOW (ref 3.5–5.2)
Alkaline Phosphatase: 49 U/L (ref 39–117)
BILIRUBIN TOTAL: 2.3 mg/dL — AB (ref 0.3–1.2)
Bilirubin, Direct: 0.9 mg/dL — ABNORMAL HIGH (ref 0.0–0.3)
Indirect Bilirubin: 1.4 mg/dL — ABNORMAL HIGH (ref 0.3–0.9)
Total Protein: 6.1 g/dL (ref 6.0–8.3)

## 2014-06-10 LAB — CBC
HCT: 43.6 % (ref 39.0–52.0)
Hemoglobin: 14 g/dL (ref 13.0–17.0)
MCH: 33.4 pg (ref 26.0–34.0)
MCHC: 32.1 g/dL (ref 30.0–36.0)
MCV: 104.1 fL — ABNORMAL HIGH (ref 78.0–100.0)
Platelets: 173 10*3/uL (ref 150–400)
RBC: 4.19 MIL/uL — AB (ref 4.22–5.81)
RDW: 14.9 % (ref 11.5–15.5)
WBC: 11.2 10*3/uL — AB (ref 4.0–10.5)

## 2014-06-10 LAB — PROTIME-INR
INR: 1.67 — ABNORMAL HIGH (ref 0.00–1.49)
Prothrombin Time: 19.7 seconds — ABNORMAL HIGH (ref 11.6–15.2)

## 2014-06-10 LAB — VANCOMYCIN, TROUGH: Vancomycin Tr: 11.2 ug/mL (ref 10.0–20.0)

## 2014-06-10 MED ORDER — SODIUM CHLORIDE 0.9 % IV SOLN
1500.0000 mg | INTRAVENOUS | Status: DC
  Administered 2014-06-10 – 2014-06-14 (×5): 1500 mg via INTRAVENOUS
  Filled 2014-06-10 (×6): qty 1500

## 2014-06-10 NOTE — Progress Notes (Signed)
eLink Physician-Brief Progress Note Patient Name: [name] Sherman DOB: [birth year] MRN: [id number]   Date of Service  06/10/2014  HPI/Events of Note  Patient with hx of COPD and CHF, with recurrent desaturations to the mid 80s.  eICU Interventions  Bipap QHS (as tolerated) and then 2hr QAM (as tolerated) and 2hrs QPM (as tolerated).     Intervention Category Intermediate Interventions: Other:  Nicolis Boody 06/10/2014, 6:07 PM

## 2014-06-10 NOTE — Progress Notes (Signed)
Subjective: He says he feels much better. He did not require BiPAP last night. He is on oxygen by mask  Objective: Vital signs in last 24 hours: Temp:  [97.6 F (36.4 C)-98.6 F (37 C)] 97.6 F (36.4 C) (08/27 0000) Pulse Rate:  [50-102] 75 (08/27 0400) Resp:  [15-32] 15 (08/27 0400) BP: (105-153)/(57-121) 138/83 mmHg (08/27 0400) SpO2:  [68 %-100 %] 95 % (08/27 0705) FiO2 (%):  [5 %-50 %] 45 % (08/27 0705) Weight:  [72.576 kg (160 lb)] 72.576 kg (160 lb) (08/27 0500) Weight change: -4.224 kg (-9 lb 5 oz) Last BM Date: 06/08/14  Intake/Output from previous day: 08/26 0701 - 08/27 0700 In: 3043.3 [P.O.:1800; I.V.:443.3; IV Piggyback:250] Out: 2500 [Urine:2500]  PHYSICAL EXAM General appearance: alert, cooperative and mild distress Resp: rhonchi bilaterally Cardio: regular rate and rhythm, S1, S2 normal, no murmur, click, rub or gallop GI: soft, non-tender; bowel sounds normal; no masses,  no organomegaly Extremities: extremities normal, atraumatic, no cyanosis or edema  Lab Results:  Results for orders placed during the hospital encounter of 06/07/14 (from the past 48 hour(s))  GLUCOSE, CAPILLARY     Status: Abnormal   Collection Time    06/08/14 11:41 AM      Result Value Ref Range   Glucose-Capillary 182 (*) 70 - 99 mg/dL  CULTURE, EXPECTORATED SPUTUM-ASSESSMENT     Status: None   Collection Time    06/08/14  2:00 PM      Result Value Ref Range   Specimen Description SPUTUM EXPECTORATED     Special Requests NONE     Sputum evaluation       Value: THIS SPECIMEN IS ACCEPTABLE. RESPIRATORY CULTURE REPORT TO FOLLOW.     Performed at [facility]   Report Status 06/09/2014 FINAL    CULTURE, RESPIRATORY (NON-EXPECTORATED)     Status: None   Collection Time    06/08/14  2:00 PM      Result Value Ref Range   Specimen Description SPUTUM EXPECTORATED     Special Requests NONE     Gram Stain       Value: MODERATE WBC PRESENT,BOTH PMN AND MONONUCLEAR     FEW  SQUAMOUS EPITHELIAL CELLS PRESENT     RARE GRAM POSITIVE COCCI     IN PAIRS FEW YEAST     Performed at [facility]   Culture PENDING     Report Status PENDING    GLUCOSE, CAPILLARY     Status: Abnormal   Collection Time    06/08/14  4:50 PM      Result Value Ref Range   Glucose-Capillary 182 (*) 70 - 99 mg/dL   Comment 1 Documented in Chart     Comment 2 Notify RN    GLUCOSE, CAPILLARY     Status: Abnormal   Collection Time    06/08/14  7:52 PM      Result Value Ref Range   Glucose-Capillary 237 (*) 70 - 99 mg/dL  GLUCOSE, CAPILLARY     Status: Abnormal   Collection Time    06/09/14 12:22 AM      Result Value Ref Range   Glucose-Capillary 160 (*) 70 - 99 mg/dL  BASIC METABOLIC PANEL     Status: Abnormal   Collection Time    06/09/14  4:34 AM      Result Value Ref Range   Sodium 140  137 - 147 mEq/L   Potassium 4.2  3.7 - 5.3 mEq/L   Chloride 101  96 - 112 mEq/L   CO2 28  19 - 32 mEq/L   Glucose, Bld 163 (*) 70 - 99 mg/dL   BUN 30 (*) 6 - 23 mg/dL   Creatinine, Ser 1.56 (*) 0.50 - 1.35 mg/dL   Calcium 8.0 (*) 8.4 - 10.5 mg/dL   GFR calc non Af Amer 42 (*) >90 mL/min   GFR calc Af Amer 48 (*) >90 mL/min   Comment: (NOTE)     The eGFR has been calculated using the CKD EPI equation.     This calculation has not been validated in all clinical situations.     eGFR's persistently <90 mL/min signify possible Chronic Kidney     Disease.   Anion gap 11  5 - 15  PROCALCITONIN     Status: None   Collection Time    06/09/14  4:34 AM      Result Value Ref Range   Procalcitonin 20.97     Comment:            Interpretation:     PCT >= 10 ng/mL:     Important systemic inflammatory response,     almost exclusively due to severe bacterial     sepsis or septic shock.     (NOTE)             ICU PCT Algorithm               Non ICU PCT Algorithm        ----------------------------     ------------------------------             PCT < 0.25 ng/mL                 PCT < 0.1  ng/mL         Stopping of antibiotics            Stopping of antibiotics           strongly encouraged.               strongly encouraged.        ----------------------------     ------------------------------           PCT level decrease by               PCT < 0.25 ng/mL           >= 80% from peak PCT           OR PCT 0.25 - 0.5 ng/mL          Stopping of antibiotics                                                 encouraged.         Stopping of antibiotics               encouraged.        ----------------------------     ------------------------------           PCT level decrease by              PCT >= 0.25 ng/mL           < 80% from peak PCT            AND PCT >= 0.5 ng/mL  Continuing antibiotics                                                  encouraged.           Continuing antibiotics                encouraged.        ----------------------------     ------------------------------         PCT level increase compared          PCT > 0.5 ng/mL             with peak PCT AND              PCT >= 0.5 ng/mL             Escalation of antibiotics                                              strongly encouraged.          Escalation of antibiotics            strongly encouraged.  HEPATIC FUNCTION PANEL     Status: Abnormal   Collection Time    06/09/14  4:34 AM      Result Value Ref Range   Total Protein 5.9 (*) 6.0 - 8.3 g/dL   Albumin 2.8 (*) 3.5 - 5.2 g/dL   AST 718 (*) 0 - 37 U/L   ALT 1071 (*) 0 - 53 U/L   Alkaline Phosphatase 50  39 - 117 U/L   Total Bilirubin 2.6 (*) 0.3 - 1.2 mg/dL   Bilirubin, Direct 1.1 (*) 0.0 - 0.3 mg/dL   Indirect Bilirubin 1.5 (*) 0.3 - 0.9 mg/dL  CBC     Status: Abnormal   Collection Time    06/09/14  4:34 AM      Result Value Ref Range   WBC 9.5  4.0 - 10.5 K/uL   RBC 4.11 (*) 4.22 - 5.81 MIL/uL   Hemoglobin 13.9  13.0 - 17.0 g/dL   HCT 42.6  39.0 - 52.0 %   MCV 103.6 (*) 78.0 - 100.0 fL   MCH 33.8  26.0 - 34.0 pg   MCHC 32.6  30.0 -  36.0 g/dL   RDW 15.2  11.5 - 15.5 %   Platelets 156  150 - 400 K/uL  LACTIC ACID, PLASMA     Status: Abnormal   Collection Time    06/09/14  4:34 AM      Result Value Ref Range   Lactic Acid, Venous 2.5 (*) 0.5 - 2.2 mmol/L  GLUCOSE, CAPILLARY     Status: Abnormal   Collection Time    06/09/14  4:40 AM      Result Value Ref Range   Glucose-Capillary 152 (*) 70 - 99 mg/dL   Comment 1 Notify RN    BLOOD GAS, ARTERIAL     Status: Abnormal   Collection Time    06/09/14  4:58 AM      Result Value Ref Range   FIO2 45.00     Delivery systems BILEVEL POSITIVE AIRWAY PRESSURE     Rate 8     Inspiratory PAP 10  Expiratory PAP 5     pH, Arterial 7.459 (*) 7.350 - 7.450   pCO2 arterial 38.3  35.0 - 45.0 mmHg   pO2, Arterial 70.2 (*) 80.0 - 100.0 mmHg   Bicarbonate 26.8 (*) 20.0 - 24.0 mEq/L   TCO2 23.4  0 - 100 mmol/L   Acid-Base Excess 3.1 (*) 0.0 - 2.0 mmol/L   O2 Saturation 93.4     Patient temperature 37.0     Collection site RIGHT RADIAL     Drawn by [id number]     Sample type ARTERIAL     Allens test (pass/fail) PASS  PASS  GLUCOSE, CAPILLARY     Status: Abnormal   Collection Time    06/09/14  8:14 AM      Result Value Ref Range   Glucose-Capillary 161 (*) 70 - 99 mg/dL   Comment 1 Notify RN    PROTIME-INR     Status: Abnormal   Collection Time    06/09/14 10:29 AM      Result Value Ref Range   Prothrombin Time 27.5 (*) 11.6 - 15.2 seconds   INR 2.56 (*) 0.00 - 1.49  GLUCOSE, CAPILLARY     Status: Abnormal   Collection Time    06/09/14 11:34 AM      Result Value Ref Range   Glucose-Capillary 175 (*) 70 - 99 mg/dL   Comment 1 Notify RN    GLUCOSE, CAPILLARY     Status: Abnormal   Collection Time    06/09/14  4:29 PM      Result Value Ref Range   Glucose-Capillary 277 (*) 70 - 99 mg/dL   Comment 1 Notify RN    GLUCOSE, CAPILLARY     Status: Abnormal   Collection Time    06/09/14  7:53 PM      Result Value Ref Range   Glucose-Capillary 288 (*) 70 - 99 mg/dL   GLUCOSE, CAPILLARY     Status: Abnormal   Collection Time    06/10/14 12:11 AM      Result Value Ref Range   Glucose-Capillary 203 (*) 70 - 99 mg/dL  GLUCOSE, CAPILLARY     Status: Abnormal   Collection Time    06/10/14  4:21 AM      Result Value Ref Range   Glucose-Capillary 170 (*) 70 - 99 mg/dL  HEPATIC FUNCTION PANEL     Status: Abnormal   Collection Time    06/10/14  4:48 AM      Result Value Ref Range   Total Protein 6.1  6.0 - 8.3 g/dL   Albumin 2.8 (*) 3.5 - 5.2 g/dL   AST 468 (*) 0 - 37 U/L   ALT 1124 (*) 0 - 53 U/L   Alkaline Phosphatase 49  39 - 117 U/L   Total Bilirubin 2.3 (*) 0.3 - 1.2 mg/dL   Bilirubin, Direct 0.9 (*) 0.0 - 0.3 mg/dL   Indirect Bilirubin 1.4 (*) 0.3 - 0.9 mg/dL  BASIC METABOLIC PANEL     Status: Abnormal   Collection Time    06/10/14  4:48 AM      Result Value Ref Range   Sodium 141  137 - 147 mEq/L   Potassium 4.5  3.7 - 5.3 mEq/L   Chloride 100  96 - 112 mEq/L   CO2 29  19 - 32 mEq/L   Glucose, Bld 183 (*) 70 - 99 mg/dL   BUN 27 (*) 6 - 23 mg/dL   Creatinine, Ser 1.45 (*)  0.50 - 1.35 mg/dL   Calcium 8.5  8.4 - 10.5 mg/dL   GFR calc non Af Amer 46 (*) >90 mL/min   GFR calc Af Amer 53 (*) >90 mL/min   Comment: (NOTE)     The eGFR has been calculated using the CKD EPI equation.     This calculation has not been validated in all clinical situations.     eGFR's persistently <90 mL/min signify possible Chronic Kidney     Disease.   Anion gap 12  5 - 15  CBC     Status: Abnormal   Collection Time    06/10/14  4:48 AM      Result Value Ref Range   WBC 11.2 (*) 4.0 - 10.5 K/uL   RBC 4.19 (*) 4.22 - 5.81 MIL/uL   Hemoglobin 14.0  13.0 - 17.0 g/dL   HCT 43.6  39.0 - 52.0 %   MCV 104.1 (*) 78.0 - 100.0 fL   MCH 33.4  26.0 - 34.0 pg   MCHC 32.1  30.0 - 36.0 g/dL   RDW 14.9  11.5 - 15.5 %   Platelets 173  150 - 400 K/uL    ABGS  Recent Labs  06/09/14 0458  PHART 7.459*  PO2ART 70.2*  TCO2 23.4  HCO3 26.8*   CULTURES Recent  Results (from the past 240 hour(s))  CULTURE, BLOOD (ROUTINE X 2)     Status: None   Collection Time    06/07/14 10:45 AM      Result Value Ref Range Status   Specimen Description BLOOD RIGHT FOREARM DRAWN BY RN AMANDA   Final   Special Requests BOTTLES DRAWN AEROBIC ONLY 4CC   Final   Culture NO GROWTH 2 DAYS   Final   Report Status PENDING   Incomplete  CULTURE, BLOOD (ROUTINE X 2)     Status: None   Collection Time    06/07/14 11:18 AM      Result Value Ref Range Status   Specimen Description BLOOD LEFT ARM   Final   Special Requests BOTTLES DRAWN AEROBIC AND ANAEROBIC 6CC   Final   Culture NO GROWTH 2 DAYS   Final   Report Status PENDING   Incomplete  URINE CULTURE     Status: None   Collection Time    06/07/14 12:30 PM      Result Value Ref Range Status   Specimen Description URINE, CATHETERIZED   Final   Special Requests NONE   Final   Culture  Setup Time     Final   Value: 06/07/2014 20:28     Performed at [facility]     Final   Value: NO GROWTH     Performed at [facility]   Culture     Final   Value: NO GROWTH     Performed at [facility]   Report Status 06/08/2014 FINAL   Final  MRSA PCR SCREENING     Status: None   Collection Time    06/07/14  2:05 PM      Result Value Ref Range Status   MRSA by PCR NEGATIVE  NEGATIVE Final   Comment:            The GeneXpert MRSA Assay (FDA     approved for NASAL specimens     only), is one component of a     comprehensive MRSA colonization     surveillance program. It is  not     intended to diagnose MRSA     infection nor to guide or     monitor treatment for     MRSA infections.  CULTURE, EXPECTORATED SPUTUM-ASSESSMENT     Status: None   Collection Time    06/08/14  2:00 PM      Result Value Ref Range Status   Specimen Description SPUTUM EXPECTORATED   Final   Special Requests NONE   Final   Sputum evaluation     Final   Value: THIS SPECIMEN IS ACCEPTABLE. RESPIRATORY  CULTURE REPORT TO FOLLOW.     Performed at [facility]   Report Status 06/09/2014 FINAL   Final  CULTURE, RESPIRATORY (NON-EXPECTORATED)     Status: None   Collection Time    06/08/14  2:00 PM      Result Value Ref Range Status   Specimen Description SPUTUM EXPECTORATED   Final   Special Requests NONE   Final   Gram Stain     Final   Value: MODERATE WBC PRESENT,BOTH PMN AND MONONUCLEAR     FEW SQUAMOUS EPITHELIAL CELLS PRESENT     RARE GRAM POSITIVE COCCI     IN PAIRS FEW YEAST     Performed at [facility]   Culture PENDING   Incomplete   Report Status PENDING   Incomplete   Studies/Results: US Abdomen Complete  06/08/2014   CLINICAL DATA:  Elevated liver function tests.  EXAM: ULTRASOUND ABDOMEN COMPLETE  COMPARISON:  CT, 06/07/2014.  FINDINGS: Gallbladder:  Only mildly distended. No convincing stone. No wall thickening or pericholecystic fluid.  Common bile duct:  Diameter: 4.2 mm.  Not seen distally.  Liver:  Mild increased echogenicity. Liver normal in size. No mass or focal lesion. Hepatopetal flow was documented in the portal vein.  IVC:  No abnormality visualized.  Pancreas:  Minimally visualized, mostly obscured by bowel gas. Portions seen are unremarkable.  Spleen:  Not seen.  Left upper quadrant obscured by bowel gas.  Right Kidney:  Length: 11.2 cm. Echogenicity within normal limits. No mass or hydronephrosis visualized.  Left Kidney:  Length: 12.0 cm. Echogenicity within normal limits. No mass or hydronephrosis visualized.  Abdominal aorta:  No convincing aneurysm.  Limited visualization.  Other findings:  None.  IMPRESSION: 1. No acute findings. Exam was limited due to the patient's heavy breathing and increased bowel gas. 2. Probable hepatic steatosis. 3. Pancreas not well visualized.  Spleen not visualized.   Electronically Signed   By: Lajean Manes M.D.   On: 06/08/2014 14:08   Dg Chest Port 1 View  06/09/2014   CLINICAL DATA:  Pneumonia.  EXAM: PORTABLE CHEST  - 1 VIEW  COMPARISON:  CT chest and chest radiograph 01/05/2014.  FINDINGS: Trachea is midline. Heart is enlarged. Diffuse bilateral airspace disease persists. Aeration may have improved slightly in the right lung in the interval. No definite pleural fluid. Left hemidiaphragm is elevated.  IMPRESSION: Persistent diffuse bilateral airspace disease, with mild coarsening. Findings may be due to edema, superimposed on known pulmonary fibrosis.   Electronically Signed   By: Lorin Picket M.D.   On: 06/09/2014 07:44    Medications:  Prior to Admission:  Prescriptions prior to admission  Medication Sig Dispense Refill  . apixaban (ELIQUIS) 5 MG TABS tablet Take 5 mg by mouth 2 (two) times daily.      Marland Kitchen aspirin EC 81 MG tablet Take 81 mg by mouth daily.      Marland Kitchen  atorvastatin (LIPITOR) 10 MG tablet Take 1 tablet (10 mg total) by mouth at bedtime.      Raelyn Ensign Pollen 550 MG CAPS Take 1 capsule by mouth at bedtime.      . fluconazole (DIFLUCAN) 100 MG tablet Take 1 tablet (100 mg total) by mouth daily. First dose 7/27.  3 tablet  0  . insulin detemir (LEVEMIR) 100 UNIT/ML injection Inject 0.2 mLs (20 Units total) into the skin daily.  10 mL  11  . latanoprost (XALATAN) 0.005 % ophthalmic solution Place 1 drop into the left eye at bedtime.      Marland Kitchen levofloxacin (LEVAQUIN) 750 MG tablet Take 1 tablet (750 mg total) by mouth daily at 6 PM.  3 tablet  0  . metFORMIN (GLUCOPHAGE) 500 MG tablet Take 1 tablet (500 mg total) by mouth 2 (two) times daily with a meal.  60 tablet  1  . metoprolol (LOPRESSOR) 25 MG tablet Take 0.5 tablets (12.5 mg total) by mouth 2 (two) times daily.  60 tablet  0  . Multiple Vitamin (MULTIVITAMIN WITH MINERALS) TABS tablet Take 1 tablet by mouth daily.      . tadalafil (CIALIS) 20 MG tablet Take 20 mg by mouth daily as needed for erectile dysfunction.       Scheduled: . antiseptic oral rinse  7 mL Mouth Rinse q12n4p  . chlorhexidine  15 mL Mouth Rinse BID  . furosemide  40 mg  Intravenous BID  . insulin aspart  0-9 Units Subcutaneous 6 times per day  . latanoprost  1 drop Left Eye QHS  . levalbuterol  0.63 mg Nebulization TID  . methylPREDNISolone (SOLU-MEDROL) injection  40 mg Intravenous Q6H  . metoprolol tartrate  12.5 mg Oral BID  . pantoprazole  40 mg Oral QAC supper  . piperacillin-tazobactam (ZOSYN)  IV  3.375 g Intravenous Q8H  . vancomycin  1,000 mg Intravenous Q24H   Continuous:  [id number], acetaminophen, albuterol, alum & mag hydroxide-simeth, HYDROcodone-acetaminophen, LORazepam, ondansetron (ZOFRAN) IV, ondansetron, senna-docusate, traZODone  Assesment: He was admitted with healthcare associated pneumonia presumably from aspiration. He was septic from this. He had multi-system injury with elevated transaminases and renal dysfunction. He also has heart failure and atrial fibrillation both of those appear to be pretty stable. At baseline he has pulmonary fibrosis and has been referred to the interstitial lung disease clinic at [facility]. He says he has not felt well since he started his medication for pulmonary fibrosis which is OFEV. Principal Problem:   Sepsis Active Problems:   Hypertension   Atrial fibrillation   Acute on chronic diastolic heart failure   Pulmonary fibrosis   Acute renal failure   DM type 2 (diabetes mellitus, type 2)   HCAP (healthcare-associated pneumonia)   Acute respiratory failure with hypoxia   Hyperkalemia   Elevated transaminase level   Gastric distention    Plan: Continue current treatments.    LOS: 3 days   Jeno Calleros L 06/10/2014, 7:55 AM

## 2014-06-10 NOTE — Progress Notes (Signed)
Inpatient Diabetes Program Recommendations  AACE/ADA: New Consensus Statement on Inpatient Glycemic Control (2013)  Target Ranges:  Prepandial:   less than 140 mg/dL      Peak postprandial:   less than 180 mg/dL (1-2 hours)      Critically ill patients:  140 - 180 mg/dL   Results for [name] Sherman, [name] Sherman (MRN [id number]) as of 06/10/2014 07:36  Ref. Range 06/09/2014 08:14 06/09/2014 11:34 06/09/2014 16:29 06/09/2014 19:53 06/10/2014 00:11 06/10/2014 04:21  Glucose-Capillary Latest Range: 70-99 mg/dL 191 (H) 478 (H) 295 (H) 288 (H) 203 (H) 170 (H)   Diabetes history: DM2 Outpatient Diabetes medications: Levemir 20 units daily, Metformin 500 mg BID Current orders for Inpatient glycemic control: Novolog 0-9 units Q4H  Inpatient Diabetes Program Recommendations Insulin - Basal: Please consider ordering Levemir 7 units daily (based on 72 kg x 0.1 units).  Thanks, Orlando Penner, RN, MSN, CCRN Diabetes Coordinator Inpatient Diabetes Program [phone] (Team Pager) [phone] ([facility] office) [phone] [facility] office)

## 2014-06-10 NOTE — Progress Notes (Signed)
Spoke with patient regarding flutter valve that Dr. Juanetta Gosling had recommended. Patient stated he had one at home and did not want another one. Patient is calling family to bring in flutter valve.

## 2014-06-10 NOTE — Progress Notes (Signed)
Subjective:  On Nonrebreather (45%) mask since yesterday. Nursing staff stated she found him this morning with mask off and O2 sats were in the 40s. He drops his sats quickly on nonrebreather as well. Patient denies abdominal pain, N/V. BM within past 24 hours without melena, brbpr.   Objective: Vital signs in last 24 hours: Temp:  [97.6 F (36.4 C)-98.6 F (37 C)] 97.6 F (36.4 C) (08/27 0000) Pulse Rate:  [50-102] 75 (08/27 0400) Resp:  [15-32] 15 (08/27 0400) BP: (105-153)/(57-121) 138/83 mmHg (08/27 0400) SpO2:  [68 %-100 %] 95 % (08/27 0705) FiO2 (%):  [5 %-50 %] 45 % (08/27 0705) Weight:  [160 lb (72.576 kg)] 160 lb (72.576 kg) (08/27 0500) Last BM Date: 06/08/14 General:   Alert,  Well-developed, well-nourished, pleasant and cooperative in NAD Head:  Normocephalic and atraumatic. Eyes:  Sclera clear, no icterus.  Chest:diminished breath sounds Heart:  Regular rate and rhythm; no murmurs, clicks, rubs,  or gallops. Abdomen:  Soft, nontender and nondistended.   Normal bowel sounds, without guarding, and without rebound.   Extremities:  Without clubbing, deformity or edema. Neurologic:  Alert and  oriented x4;  grossly normal neurologically. Skin:  Intact without significant lesions or rashes. Psych:  Alert and cooperative. Normal mood and affect.  Intake/Output from previous day: 08/26 0701 - 08/27 0700 In: 3043.3 [P.O.:1800; I.V.:443.3; IV Piggyback:250] Out: 2500 [Urine:2500] Intake/Output this shift:    Lab Results: CBC  Recent Labs  06/08/14 0503 06/09/14 0434 06/10/14 0448  WBC 8.4 9.5 11.2*  HGB 14.0 13.9 14.0  HCT 43.4 42.6 43.6  MCV 106.1* 103.6* 104.1*  PLT 144* 156 173   BMET  Recent Labs  06/08/14 0503 06/09/14 0434 06/10/14 0448  NA 139 140 141  K 5.0 4.2 4.5  CL 98 101 100  CO2 GLUCOSE 131* 163* 183*  BUN 32* 30* 27*  CREATININE 1.80* 1.56* 1.45*  CALCIUM 8.3* 8.0* 8.5   LFTs  Recent Labs  06/07/14 1118 06/08/14 0503  06/09/14 0434 06/10/14 0448  BILITOT 2.1* 1.9* 2.6* 2.3*  BILIDIR 0.9*  --  1.1* 0.9*  IBILI 1.2*  --  1.5* 1.4*  ALKPHOS 64 51 50 49  AST 205* 2096* 718* 468*  ALT 130* 1316* 1071* 1124*  PROT 7.8 6.6 5.9* 6.1  ALBUMIN 3.7 3.2* 2.8* 2.8*   No results found for this basename: LIPASE,  in the last 72 hours PT/INR  Recent Labs  06/09/14 1029  LABPROT 27.5*  INR 2.56*      Imaging Studies: Ct Abdomen Pelvis Wo Contrast  06/07/2014   CLINICAL DATA:  Acute respiratory failure. Evaluate for pneumonia versus edema.  EXAM: CT CHEST, ABDOMEN AND PELVIS WITHOUT CONTRAST  TECHNIQUE: Multidetector CT imaging of the chest, abdomen and pelvis was performed following the standard protocol without IV contrast.  COMPARISON:  Chest radiograph 06/07/2014 and CT chest without contrast and high-resolution images on 12/11/2013  FINDINGS: CT CHEST FINDINGS  Cardiomegaly with biatrial enlargement appears similar to prior chest CT. Negative for pericardial effusion. Atherosclerotic calcification of the normal caliber thoracic aorta, proximal great vessels, left anterior descending coronary artery and right coronary artery are stable. Esophagus is unremarkable. A prevascular lymph node is mildly prominent measuring 7.6 mm (previously 7 mm). Precarinal lymphadenopathy has progressed since prior chest CT of 12/11/2013. This prominent lymph node currently measures 2.1 cm AP diameter (previously 1.6 cm). Subcarinal lymph node is now 14 mm AP diameter (previously 10 mm).  There is a  trace amount of pleural fluid posteriorly on the left. No pleural effusion on the right.  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 There has been a significant interval change in appearance of the lungs since the prior chest CT of 12/11/2013. There are now extensive multifocal bilateral patchy areas  of airspace disease, right greater than left, superimposed on the chronic interstitial lung disease. This manifests as extensive ground-glass opacities bilaterally with focal areas of sparing. Lung apices are fairly spared. There is no intralobular septal thickening to suggest pulmonary edema. The trachea can't mainstem bronchi are patent. Negative for pneumothorax. Lung volumes are slightly low. There is some respiratory motion artifact at the level of the mid chest. This results in an artifactually abnormal appearance of the sternum on the reformatted sagittal views. Thoracic spine vertebral bodies are normal in height and alignment.  Densely sclerotic lesion in the posterior right second rib is stable. Densely sclerotic lesion in the posterior right fifth rib is also stable. The sclerotic lesions are well-circumscribed and appear benign.  CT ABDOMEN AND PELVIS FINDINGS  There is marked gaseous distention of the stomach. Gastric wall thickness appears normal. There is an air-fluid level in the fundus of the stomach and another air-fluid level in the pylorus/duodenal bulb. Gastric outlet obstruction cannot be excluded.  Small bowel loops and colon are normal in caliber.  The noncontrast appearance of the liver, spleen, pancreas, and kidneys is within normal limits. Negative for hydronephrosis. There is bilateral perinephric stranding, nonspecific finding. Both ureters are normal in caliber. No urinary tract stone disease is seen.  There is bilateral thickening of the adrenal glands without discrete nodules or masses.  There is heavy atherosclerotic calcification of the normal caliber abdominal aorta. Scattered atherosclerotic calcification of the iliac vasculature without aneurysm.  Normal appendix.  There is a amorphous high density within the gallbladder lumen. This was not present on prior chest CT. This could reflect gallbladder sludge and/or small gallstones.  Urinary bladder decompressed by Foley catheter.  There is some air within the urinary bladder that is likely secondary to the presence of the Foley. Prostate gland appears within normal limits for size and contains some internal calcifications. Small amount of free fluid is noted in the dependent portion the pelvis. Negative for free intraperitoneal air or lymphadenopathy in the abdomen or pelvis.  There is a mild convex left in the curvature of the upper to mid lumbar spine and a mild convex right curvature at the L4-L5 level. There is partial fusion of the L4-L5 vertebral bodies along the left lateral aspect, nonsurgical. Significant disc space narrowing at L4-5 and L5-S1. No suspicious osseous lesions.  IMPRESSION: 1. Extensive bilateral airspace disease superimposed on the patient's chronic interstitial lung disease (see high-resolution chest CT performed in February 2015). Findings could reflect an acute exacerbation of usual interstitial pneumonitis or could reflect acute multifocal bilateral infectious pneumonia superimposed on chronic interstitial lung disease. There is a trace left pleural effusion. 2. Progression of reactive mediastinal lymphadenopathy, likely secondary to the current acute lung pathology. 3. Marked gaseous distention of the stomach. This could place the patient at increased risk for aspiration. Gastric outlet obstruction cannot be completely excluded. Findings were discussed by telephone with the patient's nurse Marchelle Folks, at 7:35 p.m., 06/07/2014. 4. Stable cardiomegaly with biatrial enlargement. 5. Extensive atherosclerosis, including the coronary arteries. 6. Amorphous high density within the gallbladder lumen for  which gallbladder sludge and/or small stones cannot be excluded.   Electronically Signed   By: Britta Mccreedy M.D.   On: 06/07/2014 19:44   Ct Chest Wo Contrast  06/07/2014   CLINICAL DATA:  Acute respiratory failure. Evaluate for pneumonia versus edema.  EXAM: CT CHEST, ABDOMEN AND PELVIS WITHOUT CONTRAST  TECHNIQUE:  Multidetector CT imaging of the chest, abdomen and pelvis was performed following the standard protocol without IV contrast.  COMPARISON:  Chest radiograph 06/07/2014 and CT chest without contrast and high-resolution images on 12/11/2013  FINDINGS: CT CHEST FINDINGS  Cardiomegaly with biatrial enlargement appears similar to prior chest CT. Negative for pericardial effusion. Atherosclerotic calcification of the normal caliber thoracic aorta, proximal great vessels, left anterior descending coronary artery and right coronary artery are stable. Esophagus is unremarkable. A prevascular lymph node is mildly prominent measuring 7.6 mm (previously 7 mm). Precarinal lymphadenopathy has progressed since prior chest CT of 12/11/2013. This prominent lymph node currently measures 2.1 cm AP diameter (previously 1.6 cm). Subcarinal lymph node is now 14 mm AP diameter (previously 10 mm).  There is a trace amount of pleural fluid posteriorly on the left. No pleural effusion on the right.  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 There has been a significant interval change in appearance of the lungs since the prior chest CT of 12/11/2013. There are now extensive multifocal bilateral patchy areas of airspace disease, right greater than left, superimposed on the chronic interstitial lung disease. This manifests as extensive ground-glass opacities bilaterally with focal areas of sparing. Lung apices are fairly spared. There is no intralobular septal thickening to suggest pulmonary edema. The trachea can't mainstem bronchi are patent. Negative for pneumothorax. Lung volumes are slightly low. There is some respiratory motion artifact at the level of the mid chest. This results in an artifactually abnormal appearance of the sternum on the reformatted sagittal views. Thoracic spine vertebral  bodies are normal in height and alignment.  Densely sclerotic lesion in the posterior right second rib is stable. Densely sclerotic lesion in the posterior right fifth rib is also stable. The sclerotic lesions are well-circumscribed and appear benign.  CT ABDOMEN AND PELVIS FINDINGS  There is marked gaseous distention of the stomach. Gastric wall thickness appears normal. There is an air-fluid level in the fundus of the stomach and another air-fluid level in the pylorus/duodenal bulb. Gastric outlet obstruction cannot be excluded.  Small bowel loops and colon are normal in caliber.  The noncontrast appearance of the liver, spleen, pancreas, and kidneys is within normal limits. Negative for hydronephrosis. There is bilateral perinephric stranding, nonspecific finding. Both ureters are normal in caliber. No urinary tract stone disease is seen.  There is bilateral thickening of the adrenal glands without discrete nodules or masses.  There is heavy atherosclerotic calcification of the normal caliber abdominal aorta. Scattered atherosclerotic calcification of the iliac vasculature without aneurysm.  Normal appendix.  There is a amorphous high density within the gallbladder lumen. This was not present on prior chest CT. This could reflect gallbladder sludge and/or small gallstones.  Urinary bladder decompressed by Foley catheter. There is some air within the urinary bladder that is likely secondary to the presence of the Foley. Prostate gland appears within normal limits for size and contains some internal calcifications. Small amount of free fluid is noted in the dependent portion the pelvis. Negative for free intraperitoneal air or  lymphadenopathy in the abdomen or pelvis.  There is a mild convex left in the curvature of the upper to mid lumbar spine and a mild convex right curvature at the L4-L5 level. There is partial fusion of the L4-L5 vertebral bodies along the left lateral aspect, nonsurgical. Significant disc  space narrowing at L4-5 and L5-S1. No suspicious osseous lesions.  IMPRESSION: 1. Extensive bilateral airspace disease superimposed on the patient's chronic interstitial lung disease (see high-resolution chest CT performed in February 2015). Findings could reflect an acute exacerbation of usual interstitial pneumonitis or could reflect acute multifocal bilateral infectious pneumonia superimposed on chronic interstitial lung disease. There is a trace left pleural effusion. 2. Progression of reactive mediastinal lymphadenopathy, likely secondary to the current acute lung pathology. 3. Marked gaseous distention of the stomach. This could place the patient at increased risk for aspiration. Gastric outlet obstruction cannot be completely excluded. Findings were discussed by telephone with the patient's nurse Marchelle Folks, at 7:35 p.m., 06/07/2014. 4. Stable cardiomegaly with biatrial enlargement. 5. Extensive atherosclerosis, including the coronary arteries. 6. Amorphous high density within the gallbladder lumen for which gallbladder sludge and/or small stones cannot be excluded.   Electronically Signed   By: Britta Mccreedy M.D.   On: 06/07/2014 19:44   US Abdomen Complete  06/08/2014   CLINICAL DATA:  Elevated liver function tests.  EXAM: ULTRASOUND ABDOMEN COMPLETE  COMPARISON:  CT, 06/07/2014.  FINDINGS: Gallbladder:  Only mildly distended. No convincing stone. No wall thickening or pericholecystic fluid.  Common bile duct:  Diameter: 4.2 mm.  Not seen distally.  Liver:  Mild increased echogenicity. Liver normal in size. No mass or focal lesion. Hepatopetal flow was documented in the portal vein.  IVC:  No abnormality visualized.  Pancreas:  Minimally visualized, mostly obscured by bowel gas. Portions seen are unremarkable.  Spleen:  Not seen.  Left upper quadrant obscured by bowel gas.  Right Kidney:  Length: 11.2 cm. Echogenicity within normal limits. No mass or hydronephrosis visualized.  Left Kidney:  Length: 12.0 cm.  Echogenicity within normal limits. No mass or hydronephrosis visualized.  Abdominal aorta:  No convincing aneurysm.  Limited visualization.  Other findings:  None.  IMPRESSION: 1. No acute findings. Exam was limited due to the patient's heavy breathing and increased bowel gas. 2. Probable hepatic steatosis. 3. Pancreas not well visualized.  Spleen not visualized.   Electronically Signed   By: Amie Portland M.D.   On: 06/08/2014 14:08   Dg Chest Port 1 View  06/09/2014   CLINICAL DATA:  Pneumonia.  EXAM: PORTABLE CHEST - 1 VIEW  COMPARISON:  CT chest and chest radiograph 01/05/2014.  FINDINGS: Trachea is midline. Heart is enlarged. Diffuse bilateral airspace disease persists. Aeration may have improved slightly in the right lung in the interval. No definite pleural fluid. Left hemidiaphragm is elevated.  IMPRESSION: Persistent diffuse bilateral airspace disease, with mild coarsening. Findings may be due to edema, superimposed on known pulmonary fibrosis.   Electronically Signed   By: Leanna Battles M.D.   On: 06/09/2014 07:44   Dg Chest Portable 1 View  06/07/2014   CLINICAL DATA:  Shortness of breath.  EXAM: PORTABLE CHEST - 1 VIEW  COMPARISON:  05/10/2014 and the CT of 12/11/2013  FINDINGS: Midline trachea. Mild cardiomegaly. Pulmonary artery enlargement. Moderate left hemidiaphragm elevation. No left and no definite right pleural effusion. No pneumothorax. Progressive interstitial prominence, especially on the right. Suspicion of right lower lobe airspace disease. Left upper abdominal gas is likely within prominent transverse  and splenic flexure colon.  IMPRESSION: Progressive interstitial prominence, especially on the right. Question usual interstitial pneumonia with superimposed interstitial edema or atypical infection. Consider short term radiographic followup.  Cannot exclude right lower lobe concurrent airspace disease/pneumonia.  At minimum, PA and lateral radiographs should be considered. Depending  on clinical symptomatology, repeat CT may also be informative.  Pulmonary artery enlargement suggests pulmonary arterial hypertension.   Electronically Signed   By: Jeronimo Greaves M.D.   On: 06/07/2014 10:10  [2 weeks]   Assessment: 73 year old male admitted with sepsis secondary to healthcare acquired pneumonia with concern for aspiration pneumonia in the setting of severe pulmonary fibrosis; found to have marked gaseous distension of the stomach on CT but asymptomatic. Increased risk for aspiration. Recently on Ofev as outpatient for pulmonary fibrosis, which has several adverse GI side effects and can also cause significant elevations in transaminases. Eliquis for history of afib: discontinued 8/25 with last dose 8/25 in the morning. INR yesterday over 2.5.  Eliquis will need to be held for 48 hours prior to any endoscopic procedure. Respiratory status would need to be improved/stable prior to endoscopic procedure.   Elevated LFTs: transaminases bumped to thousands. Some improvement. Viral markers negative and [place] of abdomen without acute findings. Known history of fatty liver. Ofev known to cause elevated transaminases; this has been held. With clinical presentation question bystander reaction, ischemic hepatitis, med effect. Asymptomatic. Follow LFTs. INR elevated over 2.5 yesterday.   Plan: 1. Hold on EGD for now. Patient easily drops his oxygen level and INR elevated.  2. Recheck INR today. 3. Continue to hold Eliquis and continue PPI.  4. Clear liquids. 5. LFTs tomorrow.    LOS: 3 days   Tana Coast  06/10/2014, 7:56 AM   Addendum: INR today 1.67. We will reevaluate in the morning.

## 2014-06-10 NOTE — Care Management Note (Addendum)
    Page 1 of 2   06/17/2014     3:33:30 PM CARE MANAGEMENT NOTE 06/17/2014  Patient:  [name] Sherman, [name] Sherman   Account Number:  [id number]  Date Initiated:  06/10/2014  Documentation initiated by:  Sharrie Rothman  Subjective/Objective Assessment:   Pt admitted from home with respiratory failure. Pt lives with his sister and will return home at discharge. Pt has home O2 and is active with [facility] Rn and PT. Pt has a walker for home use.     Action/Plan:   Will arrange resumption of HH at discharge. Will continue to follow for discharge planning needs.   Anticipated DC Date:  06/14/2014   Anticipated DC Plan:  HOME W HOME HEALTH SERVICES      DC Planning Services  CM consult      [facility] Choice  Resumption Of Svcs/PTA Provider   Choice offered to / List presented to:  C-1 Patient   DME arranged  NEBULIZER MACHINE      DME agency  [facility]     [facility] arranged  HH-1 RN  HH-2 PT  HH-3 OT  HH-4 NURSE'S AIDE      HH agency  [facility]   Status of service:  Completed, signed off Medicare Important Message given?  YES (If response is "NO", the following Medicare IM given date fields will be blank) Date Medicare IM given:  06/11/2014 Medicare IM given by:  Sharrie Rothman Date Additional Medicare IM given:  06/17/2014 Additional Medicare IM given by:  Sharrie Rothman  Discharge Disposition:  HOME [facility] SERVICES  Per UR Regulation:    If discussed at Long Length of Stay Meetings, dates discussed:   06/17/2014    Comments:  06/17/14 1530 Arlyss Queen, RN BSN CM Pt does not qualify for biapap per Medicare guidelines. Pt was offered bipap at private pay but pt refuses at this time. Pt has the number to [facility] if he changes his mind and wants the bipap. Dr. Mahala Menghini and Dr. Juanetta Gosling are aware of above. Also pts home O2 is provided by [employer] not [facility] (as pt was stating). New orders for sent to [facility] for the increase in  O2 requirements so that more portables can be delivered to pt for travel. HH arranged. No other CM needs noted.  06/17/14 1115 Arlyss Queen, RN BSN CM Pt discharged home today with resumption of [facility] (per pts choice). Baird Lyons with [facility] is aware and orders will be faxed to them. Bipap and neb machine has been ordered from [facility] (per pts choice and where he receives his home O2). DME will be delivered to pts home after discharge. Pt and pts nurse aware of dsicharge arrangements.  06/14/2014 0830 Kathyrn Sheriff, RN, MSN, PCCN Pt to go home with resumption of HH services and BIPAP. [facility] referal made for BIPAP, [facility] also supplying pt's home O2 prior to admission. [facility] to obtain pt information from chart.   Pt to resume HH RN/PT services at discharge through [facility].  06/11/14 1530 Arlyss Queen, RN BSN CM Pt potential discharge over the weekend. Weekend staff to call and fax orders to [facility] at discharge. Pt has home O2 in place. May need neb machine at discharge. Can be arranged by weekend staff as well.  06/10/14 1540 Arlyss Queen, RN BSN CM

## 2014-06-10 NOTE — Progress Notes (Signed)
[facility] PROGRESS NOTE  [name] Sherman ZOX:[id number] DOB: Oct 08, 1941 DOA: 06/07/2014 PCP: [name], PA-C    Code Status: Full code Family Communication: Discussed with patient. No family at the bedside. Disposition Plan: To be determined.   Consultants:  Pulmonologist, Dr. Juanetta Gosling  Curbside consultation with intensivist, Sandrea Hughs M.D.  Gastroenterology  Procedures:  BiPAP  Antibiotics:  Vancomycin 06/07/14>>   Zosyn 06/07/14>>  HPI/Subjective: Overall he is feeling better.  Shortness of breath improving  Objective: Filed Vitals:   06/10/14 0913  BP:   Pulse: 93  Temp:   Resp: 27    Intake/Output Summary (Last 24 hours) at 06/10/14 1138 Last data filed at 06/10/14 1006  Gross per 24 hour  Intake   2430 ml  Output   3250 ml  Net   -820 ml   Filed Weights   06/08/14 0515 06/09/14 0444 06/10/14 0500  Weight: 77.7 kg (171 lb 4.8 oz) 76.8 kg (169 lb 5 oz) 72.576 kg (160 lb)    Exam:   General:  Pleasant, alert 73 year old African-American man in no acute distress,  Cardiovascular: Irregular, irregular. 1+ pedal edema bilaterally  Respiratory: Fine diffuse crackles bilaterally. Breathing mildly labored at rest.  Abdomen: Positive bowel sounds, soft, no appreciable distention or rigidity; nontender.  Musculoskeletal: No acute hot red joints. Pedal pulses palpable.  Neurologic: He is alert and oriented x3. His speech is clear.   Data Reviewed: Basic Metabolic Panel:  Recent Labs Lab 06/07/14 1827 06/08/14 0209 06/08/14 0503 06/09/14 0434 06/10/14 0448  NA 135* 134* 139 140 141  K 6.2* 5.1 5.0 4.2 4.5  CL 96 96 98 101 100  CO2 GLUCOSE 158* 155* 131* 163* 183*  BUN 31* 32* 32* 30* 27*  CREATININE 1.74* 1.75* 1.80* 1.56* 1.45*  CALCIUM 8.5 8.3* 8.3* 8.0* 8.5   Liver Function Tests:  Recent Labs Lab 06/07/14 1118 06/08/14 0503 06/09/14 0434 06/10/14 0448  AST 205* 2096* 718* 468*  ALT 130* 1316*  1071* 1124*  ALKPHOS 64 51 50 49  BILITOT 2.1* 1.9* 2.6* 2.3*  PROT 7.8 6.6 5.9* 6.1  ALBUMIN 3.7 3.2* 2.8* 2.8*   No results found for this basename: LIPASE, AMYLASE,  in the last 168 hours No results found for this basename: AMMONIA,  in the last 168 hours CBC:  Recent Labs Lab 06/07/14 1008 06/08/14 0503 06/09/14 0434 06/10/14 0448  WBC 13.9* 8.4 9.5 11.2*  HGB 15.8 14.0 13.9 14.0  HCT 50.1 43.4 42.6 43.6  MCV 110.4* 106.1* 103.6* 104.1*  PLT 161 144* 156 173   Cardiac Enzymes:  Recent Labs Lab 06/07/14 1008  TROPONINI <0.30   BNP (last 3 results)  Recent Labs  04/02/14 0525 04/30/14 1115 06/07/14 1008  PROBNP 1543.0* 1929.0* 10442.0*   CBG:  Recent Labs Lab 06/09/14 1629 06/09/14 1953 06/10/14 0011 06/10/14 0421 06/10/14 0749  GLUCAP 277* 288* 203* 170* 137*    Recent Results (from the past 240 hour(s))  CULTURE, BLOOD (ROUTINE X 2)     Status: None   Collection Time    06/07/14 10:45 AM      Result Value Ref Range Status   Specimen Description BLOOD RIGHT FOREARM DRAWN BY RN AMANDA   Final   Special Requests BOTTLES DRAWN AEROBIC ONLY 4CC   Final   Culture NO GROWTH 2 DAYS   Final   Report Status PENDING   Incomplete  CULTURE, BLOOD (ROUTINE X 2)     Status: None  Collection Time    06/07/14 11:18 AM      Result Value Ref Range Status   Specimen Description BLOOD LEFT ARM   Final   Special Requests BOTTLES DRAWN AEROBIC AND ANAEROBIC 6CC   Final   Culture NO GROWTH 2 DAYS   Final   Report Status PENDING   Incomplete  URINE CULTURE     Status: None   Collection Time    06/07/14 12:30 PM      Result Value Ref Range Status   Specimen Description URINE, CATHETERIZED   Final   Special Requests NONE   Final   Culture  Setup Time     Final   Value: 06/07/2014 20:28     Performed at [facility] Count     Final   Value: NO GROWTH     Performed at [facility]   Culture     Final   Value: NO GROWTH     Performed at  [facility]   Report Status 06/08/2014 FINAL   Final  MRSA PCR SCREENING     Status: None   Collection Time    06/07/14  2:05 PM      Result Value Ref Range Status   MRSA by PCR NEGATIVE  NEGATIVE Final   Comment:            The GeneXpert MRSA Assay (FDA     approved for NASAL specimens     only), is one component of a     comprehensive MRSA colonization     surveillance program. It is not     intended to diagnose MRSA     infection nor to guide or     monitor treatment for     MRSA infections.  CULTURE, EXPECTORATED SPUTUM-ASSESSMENT     Status: None   Collection Time    06/08/14  2:00 PM      Result Value Ref Range Status   Specimen Description SPUTUM EXPECTORATED   Final   Special Requests NONE   Final   Sputum evaluation     Final   Value: THIS SPECIMEN IS ACCEPTABLE. RESPIRATORY CULTURE REPORT TO FOLLOW.     Performed at [facility]   Report Status 06/09/2014 FINAL   Final  CULTURE, RESPIRATORY (NON-EXPECTORATED)     Status: None   Collection Time    06/08/14  2:00 PM      Result Value Ref Range Status   Specimen Description SPUTUM EXPECTORATED   Final   Special Requests NONE   Final   Gram Stain     Final   Value: MODERATE WBC PRESENT,BOTH PMN AND MONONUCLEAR     FEW SQUAMOUS EPITHELIAL CELLS PRESENT     RARE GRAM POSITIVE COCCI     IN PAIRS FEW YEAST     Performed at [facility]   Culture     Final   Value: MODERATE CANDIDA ALBICANS     Performed at [facility]   Report Status PENDING   Incomplete     Studies: US Abdomen Complete  06/08/2014   CLINICAL DATA:  Elevated liver function tests.  EXAM: ULTRASOUND ABDOMEN COMPLETE  COMPARISON:  CT, 06/07/2014.  FINDINGS: Gallbladder:  Only mildly distended. No convincing stone. No wall thickening or pericholecystic fluid.  Common bile duct:  Diameter: 4.2 mm.  Not seen distally.  Liver:  Mild increased echogenicity. Liver normal in size. No mass or focal lesion. Hepatopetal flow  was  documented in the portal vein.  IVC:  No abnormality visualized.  Pancreas:  Minimally visualized, mostly obscured by bowel gas. Portions seen are unremarkable.  Spleen:  Not seen.  Left upper quadrant obscured by bowel gas.  Right Kidney:  Length: 11.2 cm. Echogenicity within normal limits. No mass or hydronephrosis visualized.  Left Kidney:  Length: 12.0 cm. Echogenicity within normal limits. No mass or hydronephrosis visualized.  Abdominal aorta:  No convincing aneurysm.  Limited visualization.  Other findings:  None.  IMPRESSION: 1. No acute findings. Exam was limited due to the patient's heavy breathing and increased bowel gas. 2. Probable hepatic steatosis. 3. Pancreas not well visualized.  Spleen not visualized.   Electronically Signed   By: Amie Portland M.D.   On: 06/08/2014 14:08   Dg Chest Port 1 View  06/09/2014   CLINICAL DATA:  Pneumonia.  EXAM: PORTABLE CHEST - 1 VIEW  COMPARISON:  CT chest and chest radiograph 01/05/2014.  FINDINGS: Trachea is midline. Heart is enlarged. Diffuse bilateral airspace disease persists. Aeration may have improved slightly in the right lung in the interval. No definite pleural fluid. Left hemidiaphragm is elevated.  IMPRESSION: Persistent diffuse bilateral airspace disease, with mild coarsening. Findings may be due to edema, superimposed on known pulmonary fibrosis.   Electronically Signed   By: Leanna Battles M.D.   On: 06/09/2014 07:44    Scheduled Meds: . antiseptic oral rinse  7 mL Mouth Rinse q12n4p  . chlorhexidine  15 mL Mouth Rinse BID  . furosemide  40 mg Intravenous BID  . insulin aspart  0-9 Units Subcutaneous 6 times per day  . latanoprost  1 drop Left Eye QHS  . levalbuterol  0.63 mg Nebulization TID  . methylPREDNISolone (SOLU-MEDROL) injection  40 mg Intravenous Q6H  . metoprolol tartrate  12.5 mg Oral BID  . pantoprazole  40 mg Oral QAC supper  . piperacillin-tazobactam (ZOSYN)  IV  3.375 g Intravenous Q8H  . vancomycin  1,000 mg  Intravenous Q24H   Continuous Infusions:   Assessment and plan:  Principal Problem:   Sepsis Active Problems:   Hypertension   Atrial fibrillation   Acute on chronic diastolic heart failure   Pulmonary fibrosis   Acute renal failure   DM type 2 (diabetes mellitus, type 2)   HCAP (healthcare-associated pneumonia)   Acute respiratory failure with hypoxia   Hyperkalemia   Elevated transaminase level   Gastric distention    1. Sepsis with lactic acidosis, presumed to be secondary to healthcare acquired pneumonia and/or aspiration pneumonia. Blood pressure has remained stable and he has not required pressors. His white blood cell count has improved. He was intermittently hypothermic and mildly febrile, but his temperature is within normal limits. Blood/urine cultures have not shown any growth. Sputum culture is currently in process. Continue current antibiotics 2. Possible superimposed pneumonitis versus aspiration pneumonitis.  We'll continue antibiotics as above. Also on IV Pepcid. 3. Severe pulmonary fibrosis. He is followed by Dr. Juanetta Gosling. Dr. Juanetta Gosling plans to refer the patient to the pulmonary fibrosis clinic at [facility]. He was recently started on a new medication for pulmonary fibrosis, OFEV. The patient had not been feeling well since starting this medication. It has been held. He is currently on IV steroids 4. Acute on Chronic diastolic heart failure with possible acute diastolic failure. Patient has elevated BNP and chest x-ray may indicate underlying pulmonary edema. Clinically he does have evidence of peripheral edema. We'll discontinue IV fluids at this time and  start IV Lasix. Echocardiogram shows preserved ejection fraction. -Acute respiratory failure with hypoxia. Etiology multifactorial including HC AP/possible aspiration pneumonia/pneumonitis/severe pulmonary edema/and chronic heart failure. He did not require further bipap overnight and is currently on ventimask.  Will  continue to wean off oxygen as tolerated. Followup ABG improved and reassuring. 5. Query gastric outlet obstruction/marked gaseous distention of the stomach on CT. He's not having any abdominal pain, nausea, vomiting. Seen by gastroenterology and plans are for endoscopy once respiratory status stabilizes. 6. Elevated LFTs, hepatitis. His AST and ALT have increased to the thousands. Etiology unclear. Viral markers were unremarkable an abdominal ultrasound did not have any significant findings. He has known fatty liver disease. Statin has been discontinued. Gastroenterology is consulted. May also be related to ischemia in setting of sepsis. Continue to follow 7. Acute renal failure. This is likely secondary to ATN and/or prerenal azotemia from his illness. Currently, he does have signs of volume overload. Renal function has improved with lasix. Continue to follow. 8. Chronic atrial fibrillation His rate is currently controlled. Will continue low dosing of metoprolol. Anticoagulation currently on hold in anticipation of EGD. Aspirin discontinued. 9. Diabetes mellitus, type II; hypoglycemic on admission. Status post D50 in the ED. His blood glucose is better. We'll start sliding scale NovoLog with the recent start of IV steroids.Maryclare Labrador continue to monitor.  Time spent:    [name] Sherman,[name] Sherman  [facility] Pager [phone]. If 7PM-7AM, please contact night-coverage at [email or web address], password [place] [place] 06/10/2014, 11:38 AM  LOS: 3 days

## 2014-06-10 NOTE — Progress Notes (Signed)
ANTIBIOTIC CONSULT NOTE - follow up  Pharmacy Consult for Vancomycin & Zosyn Indication: sepsis, PNA  No Known Allergies  Patient Measurements: Height:  (180.3 cm) Weight: 160 lb (72.576 kg) IBW/kg (Calculated) : 75.3  Vital Signs: BP: 141/80 mmHg (08/27 0800) Pulse Rate: 93 (08/27 0913) Intake/Output from previous day: 08/26 0701 - 08/27 0700 In: 3043.3 [P.O.:1800; I.V.:443.3; IV Piggyback:250] Out: 2500 [Urine:2500] Intake/Output from this shift: Total I/O In: -  Out: 1225 [Urine:1225]  Labs:  Recent Labs  06/08/14 0503 06/09/14 0434 06/10/14 0448  WBC 8.4 9.5 11.2*  HGB 14.0 13.9 14.0  PLT 144* 156 173  CREATININE 1.80* 1.56* 1.45*   Estimated Creatinine Clearance: 45.2 ml/min (by C-G formula based on Cr of 1.45).  Recent Labs  06/10/14 1148  VANCOTROUGH 11.2     Microbiology: Recent Results (from the past 720 hour(s))  CULTURE, BLOOD (ROUTINE X 2)     Status: None   Collection Time    06/07/14 10:45 AM      Result Value Ref Range Status   Specimen Description BLOOD RIGHT FOREARM DRAWN BY RN AMANDA   Final   Special Requests BOTTLES DRAWN AEROBIC ONLY 4CC   Final   Culture NO GROWTH 2 DAYS   Final   Report Status PENDING   Incomplete  CULTURE, BLOOD (ROUTINE X 2)     Status: None   Collection Time    06/07/14 11:18 AM      Result Value Ref Range Status   Specimen Description BLOOD LEFT ARM   Final   Special Requests BOTTLES DRAWN AEROBIC AND ANAEROBIC 6CC   Final   Culture NO GROWTH 2 DAYS   Final   Report Status PENDING   Incomplete  URINE CULTURE     Status: None   Collection Time    06/07/14 12:30 PM      Result Value Ref Range Status   Specimen Description URINE, CATHETERIZED   Final   Special Requests NONE   Final   Culture  Setup Time     Final   Value: 06/07/2014 20:28     Performed at [facility] Count     Final   Value: NO GROWTH     Performed at [facility]   Culture     Final   Value: NO GROWTH      Performed at [facility]   Report Status 06/08/2014 FINAL   Final  MRSA PCR SCREENING     Status: None   Collection Time    06/07/14  2:05 PM      Result Value Ref Range Status   MRSA by PCR NEGATIVE  NEGATIVE Final   Comment:            The GeneXpert MRSA Assay (FDA     approved for NASAL specimens     only), is one component of a     comprehensive MRSA colonization     surveillance program. It is not     intended to diagnose MRSA     infection nor to guide or     monitor treatment for     MRSA infections.  CULTURE, EXPECTORATED SPUTUM-ASSESSMENT     Status: None   Collection Time    06/08/14  2:00 PM      Result Value Ref Range Status   Specimen Description SPUTUM EXPECTORATED   Final   Special Requests NONE   Final   Sputum evaluation  Final   Value: THIS SPECIMEN IS ACCEPTABLE. RESPIRATORY CULTURE REPORT TO FOLLOW.     Performed at [facility]   Report Status 06/09/2014 FINAL   Final  CULTURE, RESPIRATORY (NON-EXPECTORATED)     Status: None   Collection Time    06/08/14  2:00 PM      Result Value Ref Range Status   Specimen Description SPUTUM EXPECTORATED   Final   Special Requests NONE   Final   Gram Stain     Final   Value: MODERATE WBC PRESENT,BOTH PMN AND MONONUCLEAR     FEW SQUAMOUS EPITHELIAL CELLS PRESENT     RARE GRAM POSITIVE COCCI     IN PAIRS FEW YEAST     Performed at [facility]   Culture     Final   Value: MODERATE CANDIDA ALBICANS     Performed at [facility]   Report Status PENDING   Incomplete   Medical History: Past Medical History  Diagnosis Date  . Essential hypertension, benign   . Type 2 diabetes mellitus   . Atrial fibrillation     Dr. [name] [facility]  . Mixed hyperlipidemia   . Pulmonary fibrosis     dx 11/2013  . Pneumonia     11/2013  . Chronic diastolic heart failure   . On home O2     2L N/C prn  . COPD (chronic obstructive pulmonary disease)    Medications:  Scheduled:  .  antiseptic oral rinse  7 mL Mouth Rinse q12n4p  . chlorhexidine  15 mL Mouth Rinse BID  . furosemide  40 mg Intravenous BID  . insulin aspart  0-9 Units Subcutaneous 6 times per day  . latanoprost  1 drop Left Eye QHS  . levalbuterol  0.63 mg Nebulization TID  . methylPREDNISolone (SOLU-MEDROL) injection  40 mg Intravenous Q6H  . metoprolol tartrate  12.5 mg Oral BID  . pantoprazole  40 mg Oral QAC supper  . piperacillin-tazobactam (ZOSYN)  IV  3.375 g Intravenous Q8H  . vancomycin  1,000 mg Intravenous Q24H   Assessment: 73 yo M who presents with shortness of breath.  He was hospitalized ~ 1 month with PNA.   He was febrile & tachycardic with elevated WBC and lactic acid level on admission.   Scr has improving.  Currently afebrile with normal WBC.  CXR cannot exclude PNA.  Cx data pending- Candida growing in sputum cx- likely contaminant.  Vancomycin trough is below goal range.   Vancomycin 8/24>> Zosyn 8/24>>  Goal of Therapy:  Vancomycin trough level 15-20 mcg/ml  Plan:  Continue Zosyn 3.375gm IV Q8h to be infused over 4hrs Increase Vancomycin 1500 IV q24h Weekly Vancomycin trough  Monitor renal function and cx data   Elson Clan 06/10/2014,1:30 PM

## 2014-06-11 ENCOUNTER — Inpatient Hospital Stay (HOSPITAL_COMMUNITY): Payer: Medicare Other

## 2014-06-11 LAB — BASIC METABOLIC PANEL
ANION GAP: 11 (ref 5–15)
BUN: 24 mg/dL — ABNORMAL HIGH (ref 6–23)
CALCIUM: 8.5 mg/dL (ref 8.4–10.5)
CO2: 33 mEq/L — ABNORMAL HIGH (ref 19–32)
Chloride: 97 mEq/L (ref 96–112)
Creatinine, Ser: 1.33 mg/dL (ref 0.50–1.35)
GFR calc Af Amer: 59 mL/min — ABNORMAL LOW (ref >90)
GFR, EST NON AFRICAN AMERICAN: 51 mL/min — AB (ref >90)
Glucose, Bld: 174 mg/dL — ABNORMAL HIGH (ref 70–99)
Potassium: 4.5 mEq/L (ref 3.7–5.3)
SODIUM: 141 meq/L (ref 137–147)

## 2014-06-11 LAB — GLUCOSE, CAPILLARY
GLUCOSE-CAPILLARY: 162 mg/dL — AB (ref 70–99)
GLUCOSE-CAPILLARY: 274 mg/dL — AB (ref 70–99)
Glucose-Capillary: 177 mg/dL — ABNORMAL HIGH (ref 70–99)
Glucose-Capillary: 212 mg/dL — ABNORMAL HIGH (ref 70–99)
Glucose-Capillary: 213 mg/dL — ABNORMAL HIGH (ref 70–99)
Glucose-Capillary: 246 mg/dL — ABNORMAL HIGH (ref 70–99)
Glucose-Capillary: 254 mg/dL — ABNORMAL HIGH (ref 70–99)
Glucose-Capillary: 288 mg/dL — ABNORMAL HIGH (ref 70–99)

## 2014-06-11 LAB — CULTURE, RESPIRATORY W GRAM STAIN

## 2014-06-11 LAB — CBC
HEMATOCRIT: 42.5 % (ref 39.0–52.0)
HEMOGLOBIN: 13.9 g/dL (ref 13.0–17.0)
MCH: 34.1 pg — ABNORMAL HIGH (ref 26.0–34.0)
MCHC: 32.7 g/dL (ref 30.0–36.0)
MCV: 104.2 fL — ABNORMAL HIGH (ref 78.0–100.0)
Platelets: 163 10*3/uL (ref 150–400)
RBC: 4.08 MIL/uL — ABNORMAL LOW (ref 4.22–5.81)
RDW: 14.8 % (ref 11.5–15.5)
WBC: 9.1 10*3/uL (ref 4.0–10.5)

## 2014-06-11 LAB — HEPATIC FUNCTION PANEL
ALT: 941 U/L — ABNORMAL HIGH (ref 0–53)
AST: 230 U/L — ABNORMAL HIGH (ref 0–37)
Albumin: 2.9 g/dL — ABNORMAL LOW (ref 3.5–5.2)
Alkaline Phosphatase: 50 U/L (ref 39–117)
BILIRUBIN INDIRECT: 1.3 mg/dL — AB (ref 0.3–0.9)
Bilirubin, Direct: 0.6 mg/dL — ABNORMAL HIGH (ref 0.0–0.3)
Total Bilirubin: 1.9 mg/dL — ABNORMAL HIGH (ref 0.3–1.2)
Total Protein: 6.2 g/dL (ref 6.0–8.3)

## 2014-06-11 LAB — PRO B NATRIURETIC PEPTIDE: Pro B Natriuretic peptide (BNP): 2818 pg/mL — ABNORMAL HIGH (ref 0–450)

## 2014-06-11 NOTE — Progress Notes (Signed)
[facility] PROGRESS NOTE  [name] Sherman WUJ:[id number] DOB: 07/31/1941 DOA: 06/07/2014 PCP: [name], PA-C    Code Status: Full code Family Communication: Discussed with patient. No family at the bedside. Disposition Plan: To be determined.   Consultants:  Pulmonologist, Dr. Juanetta Gosling  Curbside consultation with intensivist, Sandrea Hughs M.D.  Gastroenterology  Procedures:  BiPAP  Antibiotics:  Vancomycin 06/07/14>>   Zosyn 06/07/14>>  HPI/Subjective: Feeling better this morning.  Has productive cough.  Objective: Filed Vitals:   06/11/14 1100  BP:   Pulse: 85  Temp:   Resp: 26    Intake/Output Summary (Last 24 hours) at 06/11/14 1154 Last data filed at 06/11/14 1050  Gross per 24 hour  Intake    850 ml  Output   3395 ml  Net  -2545 ml   Filed Weights   06/09/14 0444 06/10/14 0500 06/11/14 0500  Weight: 76.8 kg (169 lb 5 oz) 72.576 kg (160 lb) 72.6 kg (160 lb 0.9 oz)    Exam:   General:  Pleasant, alert 73 year old African-American man in no acute distress,  Cardiovascular: Irregular, irregular. 1+ pedal edema bilaterally  Respiratory: Fine diffuse crackles bilaterally. Breathing mildly labored at rest.  Abdomen: Positive bowel sounds, soft, no appreciable distention or rigidity; nontender.  Musculoskeletal: No acute hot red joints. Pedal pulses palpable.  Neurologic: He is alert and oriented x3. His speech is clear.   Data Reviewed: Basic Metabolic Panel:  Recent Labs Lab 06/08/14 0209 06/08/14 0503 06/09/14 0434 06/10/14 0448 06/11/14 0454  NA 134* 139 140 141 141  K 5.1 5.0 4.2 4.5 4.5  CL 96 98 101 100 97  CO2 33*  GLUCOSE 155* 131* 163* 183* 174*  BUN 32* 32* 30* 27* 24*  CREATININE 1.75* 1.80* 1.56* 1.45* 1.33  CALCIUM 8.3* 8.3* 8.0* 8.5 8.5   Liver Function Tests:  Recent Labs Lab 06/07/14 1118 06/08/14 0503 06/09/14 0434 06/10/14 0448 06/11/14 0454  AST 205* 2096* 718* 468* 230*  ALT 130*  1316* 1071* 1124* 941*  ALKPHOS 64 51 50 49 50  BILITOT 2.1* 1.9* 2.6* 2.3* 1.9*  PROT 7.8 6.6 5.9* 6.1 6.2  ALBUMIN 3.7 3.2* 2.8* 2.8* 2.9*   No results found for this basename: LIPASE, AMYLASE,  in the last 168 hours No results found for this basename: AMMONIA,  in the last 168 hours CBC:  Recent Labs Lab 06/07/14 1008 06/08/14 0503 06/09/14 0434 06/10/14 0448 06/11/14 0454  WBC 13.9* 8.4 9.5 11.2* 9.1  HGB 15.8 14.0 13.9 14.0 13.9  HCT 50.1 43.4 42.6 43.6 42.5  MCV 110.4* 106.1* 103.6* 104.1* 104.2*  PLT 161 144* 156 173 163   Cardiac Enzymes:  Recent Labs Lab 06/07/14 1008  TROPONINI <0.30   BNP (last 3 results)  Recent Labs  04/30/14 1115 06/07/14 1008 06/11/14 0800  PROBNP 1929.0* 10442.0* 2818.0*   CBG:  Recent Labs Lab 06/10/14 1647 06/10/14 2000 06/11/14 0010 06/11/14 0417 06/11/14 0806  GLUCAP 196* 291* 246* 177* 213*    Recent Results (from the past 240 hour(s))  CULTURE, BLOOD (ROUTINE X 2)     Status: None   Collection Time    06/07/14 10:45 AM      Result Value Ref Range Status   Specimen Description BLOOD RIGHT FOREARM DRAWN BY RN [facility]   Final   Special Requests BOTTLES DRAWN AEROBIC ONLY 4CC   Final   Culture NO GROWTH 3 DAYS   Final   Report Status PENDING  Incomplete  CULTURE, BLOOD (ROUTINE X 2)     Status: None   Collection Time    06/07/14 11:18 AM      Result Value Ref Range Status   Specimen Description BLOOD LEFT ARM   Final   Special Requests BOTTLES DRAWN AEROBIC AND ANAEROBIC 6CC   Final   Culture NO GROWTH 3 DAYS   Final   Report Status PENDING   Incomplete  URINE CULTURE     Status: None   Collection Time    06/07/14 12:30 PM      Result Value Ref Range Status   Specimen Description URINE, CATHETERIZED   Final   Special Requests NONE   Final   Culture  Setup Time     Final   Value: 06/07/2014 20:28     Performed at [facility] Count     Final   Value: NO GROWTH     Performed at [facility]   Culture     Final   Value: NO GROWTH     Performed at [facility]   Report Status 06/08/2014 FINAL   Final  MRSA PCR SCREENING     Status: None   Collection Time    06/07/14  2:05 PM      Result Value Ref Range Status   MRSA by PCR NEGATIVE  NEGATIVE Final   Comment:            The GeneXpert MRSA Assay (FDA     approved for NASAL specimens     only), is one component of a     comprehensive MRSA colonization     surveillance program. It is not     intended to diagnose MRSA     infection nor to guide or     monitor treatment for     MRSA infections.  CULTURE, EXPECTORATED SPUTUM-ASSESSMENT     Status: None   Collection Time    06/08/14  2:00 PM      Result Value Ref Range Status   Specimen Description SPUTUM EXPECTORATED   Final   Special Requests NONE   Final   Sputum evaluation     Final   Value: THIS SPECIMEN IS ACCEPTABLE. RESPIRATORY CULTURE REPORT TO FOLLOW.     Performed at [facility]   Report Status 06/09/2014 FINAL   Final  CULTURE, RESPIRATORY (NON-EXPECTORATED)     Status: None   Collection Time    06/08/14  2:00 PM      Result Value Ref Range Status   Specimen Description SPUTUM EXPECTORATED   Final   Special Requests NONE   Final   Gram Stain     Final   Value: MODERATE WBC PRESENT,BOTH PMN AND MONONUCLEAR     FEW SQUAMOUS EPITHELIAL CELLS PRESENT     RARE GRAM POSITIVE COCCI     IN PAIRS FEW YEAST     Performed at [facility]   Culture     Final   Value: MODERATE CANDIDA ALBICANS     Performed at [facility]   Report Status 06/11/2014 FINAL   Final     Studies: Dg Chest Port 1 View  06/11/2014   CLINICAL DATA:  Pneumonia. Atrial fibrillation. Congestive heart failure. Pulmonary fibrosis. Acute renal failure.  EXAM: PORTABLE CHEST - 1 VIEW  COMPARISON:  06/09/2014  FINDINGS: Cardiomegaly stable. Diffuse bilateral airspace disease shows no significant change. Cardiomegaly stable. Elevation of left hemidiaphragm  is again demonstrated. No evidence of pleural effusion or pneumothorax.  IMPRESSION: No significant change compared with prior exam.   Electronically Signed   By: Myles Rosenthal M.D.   On: 06/11/2014 09:34    Scheduled Meds: . antiseptic oral rinse  7 mL Mouth Rinse q12n4p  . chlorhexidine  15 mL Mouth Rinse BID  . furosemide  40 mg Intravenous BID  . insulin aspart  0-9 Units Subcutaneous 6 times per day  . latanoprost  1 drop Left Eye QHS  . levalbuterol  0.63 mg Nebulization TID  . methylPREDNISolone (SOLU-MEDROL) injection  40 mg Intravenous Q6H  . metoprolol tartrate  12.5 mg Oral BID  . pantoprazole  40 mg Oral QAC supper  . piperacillin-tazobactam (ZOSYN)  IV  3.375 g Intravenous Q8H  . vancomycin  1,500 mg Intravenous Q24H   Continuous Infusions:   Assessment and plan:  Principal Problem:   Sepsis Active Problems:   Hypertension   Atrial fibrillation   Acute on chronic diastolic heart failure   Pulmonary fibrosis   Acute renal failure   DM type 2 (diabetes mellitus, type 2)   HCAP (healthcare-associated pneumonia)   Acute respiratory failure with hypoxia   Hyperkalemia   Elevated transaminase level   Gastric distention    1. Sepsis with lactic acidosis, presumed to be secondary to healthcare acquired pneumonia and/or aspiration pneumonia. Blood pressure has remained stable and he has not required pressors. His white blood cell count has improved. He was intermittently hypothermic and mildly febrile, but his temperature is within normal limits. Blood/urine cultures have not shown any growth. Sputum culture is currently in process. Continue current antibiotics 2. Possible superimposed pneumonitis versus aspiration pneumonitis.  We'll continue antibiotics as above. Also on IV Pepcid. 3. Severe pulmonary fibrosis. He is followed by Dr. Juanetta Gosling. Dr. Juanetta Gosling plans to refer the patient to the pulmonary fibrosis clinic at [facility]. He was recently started on a new medication for  pulmonary fibrosis, OFEV. The patient had not been feeling well since starting this medication. It has been held. He is currently on IV steroids 4. Acute on Chronic diastolic heart failure with possible acute diastolic failure. Patient has elevated BNP and chest x-ray may indicate underlying pulmonary edema. Clinically he does have evidence of peripheral edema. He is currently on intravenous lasix and has had good urine output. Echocardiogram shows preserved ejection fraction. Continue current treatments -Acute respiratory failure with hypoxia. Etiology multifactorial including HC AP/possible aspiration pneumonia/pneumonitis/severe pulmonary edema/and chronic heart failure. He required intermittent bipap throughout the day yesterday and overnight.  Will continue to wean off oxygen as tolerated. Currently on nasal cannula this morning 5. Query gastric outlet obstruction/marked gaseous distention of the stomach on CT. He's not having any abdominal pain, nausea, vomiting. Seen by gastroenterology and plans are for endoscopy once respiratory status stabilizes. 6. Elevated LFTs, hepatitis. His AST and ALT  increased into the thousands. Etiology unclear. Viral markers were unremarkable an abdominal ultrasound did not have any significant findings. He has known fatty liver disease. Statin has been discontinued. Gastroenterology is following. May also be related to ischemia in setting of sepsis. Continue to follow 7. Acute renal failure. This is likely secondary to ATN and/or prerenal azotemia from his illness. Currently, he does have signs of volume overload. Renal function has improved with lasix. Continue to follow. 8. Chronic atrial fibrillation His rate is currently controlled. Will continue low dosing of metoprolol. Anticoagulation currently on hold in anticipation of EGD. Aspirin discontinued. 9. Diabetes mellitus,  type II; hypoglycemic on admission. Status post D50 in the ED. His blood glucose is  better. We'll start sliding scale NovoLog with the recent start of IV steroids.Maryclare Labrador continue to monitor.  Time spent:    Keilyn Haggard  [facility] Pager [phone]. If 7PM-7AM, please contact night-coverage at [email or web address], password [facility] 06/11/2014, 11:54 AM  LOS: 4 days

## 2014-06-11 NOTE — Progress Notes (Signed)
Subjective:  Patient without complaints. Patient believes breathing is better but not at his baseline. Nursing staff states he has been on nasal canula this morning and sats in upper 80% since 7am. Was on BIPAP during night and request mask oxygen earlier this morning as well.  Dr. Juanetta Gosling plans for CXR today. Denies n/v, abd pain.  Objective: Vital signs in last 24 hours: Temp:  [97 F (36.1 C)-98.2 F (36.8 C)] 97 F (36.1 C) (08/28 0400) Pulse Rate:  [58-94] 77 (08/28 0500) Resp:  [15-32] 25 (08/28 0500) BP: (125-145)/(73-92) 145/88 mmHg (08/28 0437) SpO2:  [81 %-98 %] 95 % (08/28 0500) FiO2 (%):  [40 %-50 %] 40 % (08/27 2034) Weight:  [160 lb 0.9 oz (72.6 kg)] 160 lb 0.9 oz (72.6 kg) (08/28 0500) Last BM Date: 06/08/14 General:   Alert,  Well-developed, well-nourished, pleasant and cooperative in NAD Head:  Normocephalic and atraumatic. Eyes:  Sclera clear, no icterus.  Chest: diminished BS.  Heart:  Regular rate and rhythm; no murmurs, clicks, rubs,  or gallops. Abdomen:  Soft, nontender and nondistended. Normal bowel sounds, without guarding, and without rebound.   Extremities:  Without clubbing, deformity or edema. Neurologic:  Alert and  oriented x4;  grossly normal neurologically. Skin:  Intact without significant lesions or rashes. Psych:  Alert and cooperative. Normal mood and affect.  Intake/Output from previous day: 08/27 0701 - 08/28 0700 In: 850 [I.V.:200; IV Piggyback:650] Out: 3825 [Urine:3825] Intake/Output this shift:    Lab Results: CBC  Recent Labs  06/09/14 0434 06/10/14 0448 06/11/14 0454  WBC 9.5 11.2* 9.1  HGB 13.9 14.0 13.9  HCT 42.6 43.6 42.5  MCV 103.6* 104.1* 104.2*  PLT 156 173 163   BMET  Recent Labs  06/09/14 0434 06/10/14 0448 06/11/14 0454  NA 140 141 141  K 4.2 4.5 4.5  CL 101 100 97  CO2 28 29 33*  GLUCOSE 163* 183* 174*  BUN 30* 27* 24*  CREATININE 1.56* 1.45* 1.33  CALCIUM 8.0* 8.5 8.5   LFTs  Recent Labs   06/09/14 0434 06/10/14 0448 06/11/14 0454  BILITOT 2.6* 2.3* 1.9*  BILIDIR 1.1* 0.9* 0.6*  IBILI 1.5* 1.4* 1.3*  ALKPHOS 50 49 50  AST 718* 468* 230*  ALT 1071* 1124* 941*  PROT 5.9* 6.1 6.2  ALBUMIN 2.8* 2.8* 2.9*   No results found for this basename: LIPASE,  in the last 72 hours PT/INR  Recent Labs  06/09/14 1029 06/10/14 0955  LABPROT 27.5* 19.7*  INR 2.56* 1.67*      Imaging Studies: Ct Abdomen Pelvis Wo Contrast  06/07/2014   CLINICAL DATA:  Acute respiratory failure. Evaluate for pneumonia versus edema.  EXAM: CT CHEST, ABDOMEN AND PELVIS WITHOUT CONTRAST  TECHNIQUE: Multidetector CT imaging of the chest, abdomen and pelvis was performed following the standard protocol without IV contrast.  COMPARISON:  Chest radiograph 06/07/2014 and CT chest without contrast and high-resolution images on 12/11/2013  FINDINGS: CT CHEST FINDINGS  Cardiomegaly with biatrial enlargement appears similar to prior chest CT. Negative for pericardial effusion. Atherosclerotic calcification of the normal caliber thoracic aorta, proximal great vessels, left anterior descending coronary artery and right coronary artery are stable. Esophagus is unremarkable. A prevascular lymph node is mildly prominent measuring 7.6 mm (previously 7 mm). Precarinal lymphadenopathy has progressed since prior chest CT of 12/11/2013. This prominent lymph node currently measures 2.1 cm AP diameter (previously 1.6 cm). Subcarinal lymph node is now 14 mm AP diameter (previously 10 mm).  There is  a trace amount of pleural fluid posteriorly on the left. No pleural effusion on the right.  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 There has been a significant interval change in appearance of the lungs since the prior chest CT of 12/11/2013. There are now extensive multifocal bilateral patchy  areas of airspace disease, right greater than left, superimposed on the chronic interstitial lung disease. This manifests as extensive ground-glass opacities bilaterally with focal areas of sparing. Lung apices are fairly spared. There is no intralobular septal thickening to suggest pulmonary edema. The trachea can't mainstem bronchi are patent. Negative for pneumothorax. Lung volumes are slightly low. There is some respiratory motion artifact at the level of the mid chest. This results in an artifactually abnormal appearance of the sternum on the reformatted sagittal views. Thoracic spine vertebral bodies are normal in height and alignment.  Densely sclerotic lesion in the posterior right second rib is stable. Densely sclerotic lesion in the posterior right fifth rib is also stable. The sclerotic lesions are well-circumscribed and appear benign.  CT ABDOMEN AND PELVIS FINDINGS  There is marked gaseous distention of the stomach. Gastric wall thickness appears normal. There is an air-fluid level in the fundus of the stomach and another air-fluid level in the pylorus/duodenal bulb. Gastric outlet obstruction cannot be excluded.  Small bowel loops and colon are normal in caliber.  The noncontrast appearance of the liver, spleen, pancreas, and kidneys is within normal limits. Negative for hydronephrosis. There is bilateral perinephric stranding, nonspecific finding. Both ureters are normal in caliber. No urinary tract stone disease is seen.  There is bilateral thickening of the adrenal glands without discrete nodules or masses.  There is heavy atherosclerotic calcification of the normal caliber abdominal aorta. Scattered atherosclerotic calcification of the iliac vasculature without aneurysm.  Normal appendix.  There is a amorphous high density within the gallbladder lumen. This was not present on prior chest CT. This could reflect gallbladder sludge and/or small gallstones.  Urinary bladder decompressed by Foley  catheter. There is some air within the urinary bladder that is likely secondary to the presence of the Foley. Prostate gland appears within normal limits for size and contains some internal calcifications. Small amount of free fluid is noted in the dependent portion the pelvis. Negative for free intraperitoneal air or lymphadenopathy in the abdomen or pelvis.  There is a mild convex left in the curvature of the upper to mid lumbar spine and a mild convex right curvature at the L4-L5 level. There is partial fusion of the L4-L5 vertebral bodies along the left lateral aspect, nonsurgical. Significant disc space narrowing at L4-5 and L5-S1. No suspicious osseous lesions.  IMPRESSION: 1. Extensive bilateral airspace disease superimposed on the patient's chronic interstitial lung disease (see high-resolution chest CT performed in February 2015). Findings could reflect an acute exacerbation of usual interstitial pneumonitis or could reflect acute multifocal bilateral infectious pneumonia superimposed on chronic interstitial lung disease. There is a trace left pleural effusion. 2. Progression of reactive mediastinal lymphadenopathy, likely secondary to the current acute lung pathology. 3. Marked gaseous distention of the stomach. This could place the patient at increased risk for aspiration. Gastric outlet obstruction cannot be completely excluded. Findings were discussed by telephone with the patient's nurse Marchelle Folks, at 7:35 p.m., 06/07/2014. 4. Stable cardiomegaly with biatrial enlargement. 5. Extensive atherosclerosis, including the coronary arteries. 6. Amorphous high density within the gallbladder lumen  for which gallbladder sludge and/or small stones cannot be excluded.   Electronically Signed   By: Britta Mccreedy M.D.   On: 06/07/2014 19:44   Ct Chest Wo Contrast  06/07/2014   CLINICAL DATA:  Acute respiratory failure. Evaluate for pneumonia versus edema.  EXAM: CT CHEST, ABDOMEN AND PELVIS WITHOUT CONTRAST   TECHNIQUE: Multidetector CT imaging of the chest, abdomen and pelvis was performed following the standard protocol without IV contrast.  COMPARISON:  Chest radiograph 06/07/2014 and CT chest without contrast and high-resolution images on 12/11/2013  FINDINGS: CT CHEST FINDINGS  Cardiomegaly with biatrial enlargement appears similar to prior chest CT. Negative for pericardial effusion. Atherosclerotic calcification of the normal caliber thoracic aorta, proximal great vessels, left anterior descending coronary artery and right coronary artery are stable. Esophagus is unremarkable. A prevascular lymph node is mildly prominent measuring 7.6 mm (previously 7 mm). Precarinal lymphadenopathy has progressed since prior chest CT of 12/11/2013. This prominent lymph node currently measures 2.1 cm AP diameter (previously 1.6 cm). Subcarinal lymph node is now 14 mm AP diameter (previously 10 mm).  There is a trace amount of pleural fluid posteriorly on the left. No pleural effusion on the right.  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 There has been a significant interval change in appearance of the lungs since the prior chest CT of 12/11/2013. There are now extensive multifocal bilateral patchy areas of airspace disease, right greater than left, superimposed on the chronic interstitial lung disease. This manifests as extensive ground-glass opacities bilaterally with focal areas of sparing. Lung apices are fairly spared. There is no intralobular septal thickening to suggest pulmonary edema. The trachea can't mainstem bronchi are patent. Negative for pneumothorax. Lung volumes are slightly low. There is some respiratory motion artifact at the level of the mid chest. This results in an artifactually abnormal appearance of the sternum on the reformatted sagittal views. Thoracic spine  vertebral bodies are normal in height and alignment.  Densely sclerotic lesion in the posterior right second rib is stable. Densely sclerotic lesion in the posterior right fifth rib is also stable. The sclerotic lesions are well-circumscribed and appear benign.  CT ABDOMEN AND PELVIS FINDINGS  There is marked gaseous distention of the stomach. Gastric wall thickness appears normal. There is an air-fluid level in the fundus of the stomach and another air-fluid level in the pylorus/duodenal bulb. Gastric outlet obstruction cannot be excluded.  Small bowel loops and colon are normal in caliber.  The noncontrast appearance of the liver, spleen, pancreas, and kidneys is within normal limits. Negative for hydronephrosis. There is bilateral perinephric stranding, nonspecific finding. Both ureters are normal in caliber. No urinary tract stone disease is seen.  There is bilateral thickening of the adrenal glands without discrete nodules or masses.  There is heavy atherosclerotic calcification of the normal caliber abdominal aorta. Scattered atherosclerotic calcification of the iliac vasculature without aneurysm.  Normal appendix.  There is a amorphous high density within the gallbladder lumen. This was not present on prior chest CT. This could reflect gallbladder sludge and/or small gallstones.  Urinary bladder decompressed by Foley catheter. There is some air within the urinary bladder that is likely secondary to the presence of the Foley. Prostate gland appears within normal limits for size and contains some internal calcifications. Small amount of free fluid is noted in the dependent portion the pelvis. Negative for free intraperitoneal air  or lymphadenopathy in the abdomen or pelvis.  There is a mild convex left in the curvature of the upper to mid lumbar spine and a mild convex right curvature at the L4-L5 level. There is partial fusion of the L4-L5 vertebral bodies along the left lateral aspect, nonsurgical.  Significant disc space narrowing at L4-5 and L5-S1. No suspicious osseous lesions.  IMPRESSION: 1. Extensive bilateral airspace disease superimposed on the patient's chronic interstitial lung disease (see high-resolution chest CT performed in February 2015). Findings could reflect an acute exacerbation of usual interstitial pneumonitis or could reflect acute multifocal bilateral infectious pneumonia superimposed on chronic interstitial lung disease. There is a trace left pleural effusion. 2. Progression of reactive mediastinal lymphadenopathy, likely secondary to the current acute lung pathology. 3. Marked gaseous distention of the stomach. This could place the patient at increased risk for aspiration. Gastric outlet obstruction cannot be completely excluded. Findings were discussed by telephone with the patient's nurse Marchelle Folks, at 7:35 p.m., 06/07/2014. 4. Stable cardiomegaly with biatrial enlargement. 5. Extensive atherosclerosis, including the coronary arteries. 6. Amorphous high density within the gallbladder lumen for which gallbladder sludge and/or small stones cannot be excluded.   Electronically Signed   By: Britta Mccreedy M.D.   On: 06/07/2014 19:44   US Abdomen Complete  06/08/2014   CLINICAL DATA:  Elevated liver function tests.  EXAM: ULTRASOUND ABDOMEN COMPLETE  COMPARISON:  CT, 06/07/2014.  FINDINGS: Gallbladder:  Only mildly distended. No convincing stone. No wall thickening or pericholecystic fluid.  Common bile duct:  Diameter: 4.2 mm.  Not seen distally.  Liver:  Mild increased echogenicity. Liver normal in size. No mass or focal lesion. Hepatopetal flow was documented in the portal vein.  IVC:  No abnormality visualized.  Pancreas:  Minimally visualized, mostly obscured by bowel gas. Portions seen are unremarkable.  Spleen:  Not seen.  Left upper quadrant obscured by bowel gas.  Right Kidney:  Length: 11.2 cm. Echogenicity within normal limits. No mass or hydronephrosis visualized.  Left Kidney:   Length: 12.0 cm. Echogenicity within normal limits. No mass or hydronephrosis visualized.  Abdominal aorta:  No convincing aneurysm.  Limited visualization.  Other findings:  None.  IMPRESSION: 1. No acute findings. Exam was limited due to the patient's heavy breathing and increased bowel gas. 2. Probable hepatic steatosis. 3. Pancreas not well visualized.  Spleen not visualized.   Electronically Signed   By: Amie Portland M.D.   On: 06/08/2014 14:08   Dg Chest Port 1 View  06/09/2014   CLINICAL DATA:  Pneumonia.  EXAM: PORTABLE CHEST - 1 VIEW  COMPARISON:  CT chest and chest radiograph 01/05/2014.  FINDINGS: Trachea is midline. Heart is enlarged. Diffuse bilateral airspace disease persists. Aeration may have improved slightly in the right lung in the interval. No definite pleural fluid. Left hemidiaphragm is elevated.  IMPRESSION: Persistent diffuse bilateral airspace disease, with mild coarsening. Findings may be due to edema, superimposed on known pulmonary fibrosis.   Electronically Signed   By: Leanna Battles M.D.   On: 06/09/2014 07:44     Assessment:  73 year old male admitted with sepsis secondary to healthcare acquired pneumonia with concern for aspiration pneumonia in the setting of severe pulmonary fibrosis; found to have marked gaseous distension of the stomach on CT but asymptomatic. Increased risk for aspiration. Recently on Ofev as outpatient for pulmonary fibrosis, which has several adverse GI side effects and can also cause significant elevations in transaminases. Eliquis for history of afib: discontinued 8/25 with last dose  8/25 in the morning. INR 1.67 yesterday.   Eliquis will need to be held for 48 hours prior to any endoscopic procedure. Respiratory status would need to be improved/stable prior to endoscopic procedure.   Elevated LFTs: transaminases bumped to thousands. Continued improvement. Viral markers negative and [place] of abdomen without acute findings. Known history of fatty  liver. Ofev known to cause elevated transaminases; this has been held. With clinical presentation question bystander reaction, ischemic hepatitis, med effect. Asymptomatic. Follow LFTs. INR improved yesterday.   Plan: 1. Discussed with Dr. Darrick Penna. Patients respiratory status remains a contraindication for conscious sedation/EGD. Will follow up CXR planned by Dr. Juanetta Gosling.  2. Advance to dysphagia 2 diet today.    LOS: 4 days   Tana Coast  06/11/2014, 7:58 AM

## 2014-06-11 NOTE — Progress Notes (Addendum)
Inpatient Diabetes Program Recommendations  AACE/ADA: New Consensus Statement on Inpatient Glycemic Control (2013)  Target Ranges:  Prepandial:   less than 140 mg/dL      Peak postprandial:   less than 180 mg/dL (1-2 hours)      Critically ill patients:  140 - 180 mg/dL    Results for JAMARR, TREINEN (MRN [id number]) as of 06/11/2014 08:37  Ref. Range 06/10/2014 00:11 06/10/2014 04:21 06/10/2014 07:49 06/10/2014 11:52 06/10/2014 16:47 06/10/2014 20:00  Glucose-Capillary Latest Range: 70-99 mg/dL 409 (H) 811 (H) 914 (H) 249 (H) 196 (H) 291 (H)    Results for AMAREE, LEEPER (MRN [id number]) as of 06/11/2014 08:37  Ref. Range 06/11/2014 00:10 06/11/2014 04:17 06/11/2014 08:06  Glucose-Capillary Latest Range: 70-99 mg/dL 086 (H) 578 (H) 469 (H)    Home DM Meds: Levemir 20 units daily, Metformin 500 mg BID   Current orders: Novolog 0-9 units Q4H    Inpatient Diabetes Program Recommendations  Insulin - Basal: Please consider ordering Levemir 10 units daily (1/2 home dose to start)    Will follow Ambrose Finland RN, MSN, CDE Diabetes Coordinator Inpatient Diabetes Program Team Pager: [phone] (8a-10p)

## 2014-06-11 NOTE — Progress Notes (Signed)
Subjective: He had more trouble with hypoxia yesterday. He was placed back on BiPAP overnight. This morning when he came off BiPAP he was placed on nasal oxygen initially but could not maintain his oxygen saturation and was placed on mask oxygen. He is having significantly more coughing his cough is part of the reason that it appeared that he could not maintain his oxygen saturation on nasal cannula. He is coughing up some sputum. He is using a flutter valve which is helping some. He denies any chest pain.  Objective: Vital signs in last 24 hours: Temp:  [97 F (36.1 C)-98.2 F (36.8 C)] 97 F (36.1 C) (08/28 0400) Pulse Rate:  [58-94] 80 (08/28 0800) Resp:  [15-32] 21 (08/28 0800) BP: (125-145)/(73-96) 126/77 mmHg (08/28 0800) SpO2:  [73 %-98 %] 88 % (08/28 0802) FiO2 (%):  [40 %-50 %] 40 % (08/28 0802) Weight:  [72.6 kg (160 lb 0.9 oz)] 72.6 kg (160 lb 0.9 oz) (08/28 0500) Weight change: 0.024 kg (0.9 oz) Last BM Date: 06/08/14  Intake/Output from previous day: 08/27 0701 - 08/28 0700 In: 850 [I.V.:200; IV Piggyback:650] Out: 0312 [Urine:3825]  PHYSICAL EXAM General appearance: alert, cooperative and mild distress Resp: rhonchi bilaterally Cardio: Atrial fibrillation but with no gallop GI: soft, non-tender; bowel sounds normal; no masses,  no organomegaly Extremities: He has trace edema of the extremities  Lab Results:  Results for orders placed during the hospital encounter of 06/07/14 (from the past 48 hour(s))  PROTIME-INR     Status: Abnormal   Collection Time    06/09/14 10:29 AM      Result Value Ref Range   Prothrombin Time 27.5 (*) 11.6 - 15.2 seconds   INR 2.56 (*) 0.00 - 1.49  GLUCOSE, CAPILLARY     Status: Abnormal   Collection Time    06/09/14 11:34 AM      Result Value Ref Range   Glucose-Capillary 175 (*) 70 - 99 mg/dL   Comment 1 Notify RN    GLUCOSE, CAPILLARY     Status: Abnormal   Collection Time    06/09/14  4:29 PM      Result Value Ref Range   Glucose-Capillary 277 (*) 70 - 99 mg/dL   Comment 1 Notify RN    GLUCOSE, CAPILLARY     Status: Abnormal   Collection Time    06/09/14  7:53 PM      Result Value Ref Range   Glucose-Capillary 288 (*) 70 - 99 mg/dL  GLUCOSE, CAPILLARY     Status: Abnormal   Collection Time    06/10/14 12:11 AM      Result Value Ref Range   Glucose-Capillary 203 (*) 70 - 99 mg/dL  GLUCOSE, CAPILLARY     Status: Abnormal   Collection Time    06/10/14  4:21 AM      Result Value Ref Range   Glucose-Capillary 170 (*) 70 - 99 mg/dL  HEPATIC FUNCTION PANEL     Status: Abnormal   Collection Time    06/10/14  4:48 AM      Result Value Ref Range   Total Protein 6.1  6.0 - 8.3 g/dL   Albumin 2.8 (*) 3.5 - 5.2 g/dL   AST 468 (*) 0 - 37 U/L   ALT 1124 (*) 0 - 53 U/L   Alkaline Phosphatase 49  39 - 117 U/L   Total Bilirubin 2.3 (*) 0.3 - 1.2 mg/dL   Bilirubin, Direct 0.9 (*) 0.0 - 0.3 mg/dL  Indirect Bilirubin 1.4 (*) 0.3 - 0.9 mg/dL  BASIC METABOLIC PANEL     Status: Abnormal   Collection Time    06/10/14  4:48 AM      Result Value Ref Range   Sodium 141  137 - 147 mEq/L   Potassium 4.5  3.7 - 5.3 mEq/L   Chloride 100  96 - 112 mEq/L   CO2 29  19 - 32 mEq/L   Glucose, Bld 183 (*) 70 - 99 mg/dL   BUN 27 (*) 6 - 23 mg/dL   Creatinine, Ser 1.45 (*) 0.50 - 1.35 mg/dL   Calcium 8.5  8.4 - 10.5 mg/dL   GFR calc non Af Amer 46 (*) >90 mL/min   GFR calc Af Amer 53 (*) >90 mL/min   Comment: (NOTE)     The eGFR has been calculated using the CKD EPI equation.     This calculation has not been validated in all clinical situations.     eGFR's persistently <90 mL/min signify possible Chronic Kidney     Disease.   Anion gap 12  5 - 15  CBC     Status: Abnormal   Collection Time    06/10/14  4:48 AM      Result Value Ref Range   WBC 11.2 (*) 4.0 - 10.5 K/uL   RBC 4.19 (*) 4.22 - 5.81 MIL/uL   Hemoglobin 14.0  13.0 - 17.0 g/dL   HCT 43.6  39.0 - 52.0 %   MCV 104.1 (*) 78.0 - 100.0 fL   MCH 33.4  26.0 -  34.0 pg   MCHC 32.1  30.0 - 36.0 g/dL   RDW 14.9  11.5 - 15.5 %   Platelets 173  150 - 400 K/uL  GLUCOSE, CAPILLARY     Status: Abnormal   Collection Time    06/10/14  7:49 AM      Result Value Ref Range   Glucose-Capillary 137 (*) 70 - 99 mg/dL   Comment 1 Notify RN    PROTIME-INR     Status: Abnormal   Collection Time    06/10/14  9:55 AM      Result Value Ref Range   Prothrombin Time 19.7 (*) 11.6 - 15.2 seconds   INR 1.67 (*) 0.00 - 1.49  VANCOMYCIN, TROUGH     Status: None   Collection Time    06/10/14 11:48 AM      Result Value Ref Range   Vancomycin Tr 11.2  10.0 - 20.0 ug/mL  GLUCOSE, CAPILLARY     Status: Abnormal   Collection Time    06/10/14 11:52 AM      Result Value Ref Range   Glucose-Capillary 249 (*) 70 - 99 mg/dL   Comment 1 Documented in Chart     Comment 2 Notify RN    GLUCOSE, CAPILLARY     Status: Abnormal   Collection Time    06/10/14  4:47 PM      Result Value Ref Range   Glucose-Capillary 196 (*) 70 - 99 mg/dL  GLUCOSE, CAPILLARY     Status: Abnormal   Collection Time    06/10/14  8:00 PM      Result Value Ref Range   Glucose-Capillary 291 (*) 70 - 99 mg/dL   Comment 1 Notify RN    GLUCOSE, CAPILLARY     Status: Abnormal   Collection Time    06/11/14 12:10 AM      Result Value Ref Range   Glucose-Capillary  246 (*) 70 - 99 mg/dL   Comment 1 Notify RN    GLUCOSE, CAPILLARY     Status: Abnormal   Collection Time    06/11/14  4:17 AM      Result Value Ref Range   Glucose-Capillary 177 (*) 70 - 99 mg/dL   Comment 1 Notify RN    BASIC METABOLIC PANEL     Status: Abnormal   Collection Time    06/11/14  4:54 AM      Result Value Ref Range   Sodium 141  137 - 147 mEq/L   Potassium 4.5  3.7 - 5.3 mEq/L   Chloride 97  96 - 112 mEq/L   CO2 33 (*) 19 - 32 mEq/L   Glucose, Bld 174 (*) 70 - 99 mg/dL   BUN 24 (*) 6 - 23 mg/dL   Creatinine, Ser 1.33  0.50 - 1.35 mg/dL   Calcium 8.5  8.4 - 10.5 mg/dL   GFR calc non Af Amer 51 (*) >90 mL/min   GFR  calc Af Amer 59 (*) >90 mL/min   Comment: (NOTE)     The eGFR has been calculated using the CKD EPI equation.     This calculation has not been validated in all clinical situations.     eGFR's persistently <90 mL/min signify possible Chronic Kidney     Disease.   Anion gap 11  5 - 15  HEPATIC FUNCTION PANEL     Status: Abnormal   Collection Time    06/11/14  4:54 AM      Result Value Ref Range   Total Protein 6.2  6.0 - 8.3 g/dL   Albumin 2.9 (*) 3.5 - 5.2 g/dL   AST 230 (*) 0 - 37 U/L   ALT 941 (*) 0 - 53 U/L   Alkaline Phosphatase 50  39 - 117 U/L   Total Bilirubin 1.9 (*) 0.3 - 1.2 mg/dL   Bilirubin, Direct 0.6 (*) 0.0 - 0.3 mg/dL   Indirect Bilirubin 1.3 (*) 0.3 - 0.9 mg/dL  CBC     Status: Abnormal   Collection Time    06/11/14  4:54 AM      Result Value Ref Range   WBC 9.1  4.0 - 10.5 K/uL   RBC 4.08 (*) 4.22 - 5.81 MIL/uL   Hemoglobin 13.9  13.0 - 17.0 g/dL   HCT 42.5  39.0 - 52.0 %   MCV 104.2 (*) 78.0 - 100.0 fL   MCH 34.1 (*) 26.0 - 34.0 pg   MCHC 32.7  30.0 - 36.0 g/dL   RDW 14.8  11.5 - 15.5 %   Platelets 163  150 - 400 K/uL    ABGS  Recent Labs  06/09/14 0458  PHART 7.459*  PO2ART 70.2*  TCO2 23.4  HCO3 26.8*   CULTURES Recent Results (from the past 240 hour(s))  CULTURE, BLOOD (ROUTINE X 2)     Status: None   Collection Time    06/07/14 10:45 AM      Result Value Ref Range Status   Specimen Description BLOOD RIGHT FOREARM DRAWN BY RN AMANDA   Final   Special Requests BOTTLES DRAWN AEROBIC ONLY 4CC   Final   Culture NO GROWTH 3 DAYS   Final   Report Status PENDING   Incomplete  CULTURE, BLOOD (ROUTINE X 2)     Status: None   Collection Time    06/07/14 11:18 AM      Result Value Ref Range Status  Specimen Description BLOOD LEFT ARM   Final   Special Requests BOTTLES DRAWN AEROBIC AND ANAEROBIC 6CC   Final   Culture NO GROWTH 3 DAYS   Final   Report Status PENDING   Incomplete  URINE CULTURE     Status: None   Collection Time    06/07/14  12:30 PM      Result Value Ref Range Status   Specimen Description URINE, CATHETERIZED   Final   Special Requests NONE   Final   Culture  Setup Time     Final   Value: 06/07/2014 20:28     Performed at [place]     Final   Value: NO GROWTH     Performed at [facility]   Culture     Final   Value: NO GROWTH     Performed at [facility]   Report Status 06/08/2014 FINAL   Final  MRSA PCR SCREENING     Status: None   Collection Time    06/07/14  2:05 PM      Result Value Ref Range Status   MRSA by PCR NEGATIVE  NEGATIVE Final   Comment:            The GeneXpert MRSA Assay (FDA     approved for NASAL specimens     only), is one component of a     comprehensive MRSA colonization     surveillance program. It is not     intended to diagnose MRSA     infection nor to guide or     monitor treatment for     MRSA infections.  CULTURE, EXPECTORATED SPUTUM-ASSESSMENT     Status: None   Collection Time    06/08/14  2:00 PM      Result Value Ref Range Status   Specimen Description SPUTUM EXPECTORATED   Final   Special Requests NONE   Final   Sputum evaluation     Final   Value: THIS SPECIMEN IS ACCEPTABLE. RESPIRATORY CULTURE REPORT TO FOLLOW.     Performed at [facility]   Report Status 06/09/2014 FINAL   Final  CULTURE, RESPIRATORY (NON-EXPECTORATED)     Status: None   Collection Time    06/08/14  2:00 PM      Result Value Ref Range Status   Specimen Description SPUTUM EXPECTORATED   Final   Special Requests NONE   Final   Gram Stain     Final   Value: MODERATE WBC PRESENT,BOTH PMN AND MONONUCLEAR     FEW SQUAMOUS EPITHELIAL CELLS PRESENT     RARE GRAM POSITIVE COCCI     IN PAIRS FEW YEAST     Performed at [facility]   Culture     Final   Value: MODERATE CANDIDA ALBICANS     Performed at [facility]   Report Status 06/11/2014 FINAL   Final   Studies/Results: No results found.  Medications:  Prior to  Admission:  Prescriptions prior to admission  Medication Sig Dispense Refill  . apixaban (ELIQUIS) 5 MG TABS tablet Take 5 mg by mouth 2 (two) times daily.      Marland Kitchen aspirin EC 81 MG tablet Take 81 mg by mouth daily.      Marland Kitchen atorvastatin (LIPITOR) 20 MG tablet Take 10 mg by mouth daily.      Marland Kitchen BEE POLLEN PO Take 1 capsule by mouth daily.      Marland Kitchen  furosemide (LASIX) 20 MG tablet Take 20 mg by mouth every Monday, Wednesday, and Friday.      . insulin detemir (LEVEMIR) 100 UNIT/ML injection Inject 0.2 mLs (20 Units total) into the skin daily.  10 mL  11  . latanoprost (XALATAN) 0.005 % ophthalmic solution Place 1 drop into the left eye at bedtime.      . magnesium oxide (MAG-OX) 400 MG tablet Take 400 mg by mouth daily at 6 PM.      . metFORMIN (GLUCOPHAGE) 500 MG tablet Take 1 tablet (500 mg total) by mouth 2 (two) times daily with a meal.  60 tablet  1  . metoprolol tartrate (LOPRESSOR) 25 MG tablet Take 25 mg by mouth 2 (two) times daily.      . Nintedanib Esylate (OFEV) 150 MG CAPS Take 1 capsule by mouth every 12 (twelve) hours.      . tadalafil (CIALIS) 20 MG tablet Take 20 mg by mouth daily as needed for erectile dysfunction.       Scheduled: . antiseptic oral rinse  7 mL Mouth Rinse q12n4p  . chlorhexidine  15 mL Mouth Rinse BID  . furosemide  40 mg Intravenous BID  . insulin aspart  0-9 Units Subcutaneous 6 times per day  . latanoprost  1 drop Left Eye QHS  . levalbuterol  0.63 mg Nebulization TID  . methylPREDNISolone (SOLU-MEDROL) injection  40 mg Intravenous Q6H  . metoprolol tartrate  12.5 mg Oral BID  . pantoprazole  40 mg Oral QAC supper  . piperacillin-tazobactam (ZOSYN)  IV  3.375 g Intravenous Q8H  . vancomycin  1,500 mg Intravenous Q24H   Continuous:  IWL:[id number], acetaminophen, albuterol, alum & mag hydroxide-simeth, HYDROcodone-acetaminophen, LORazepam, ondansetron (ZOFRAN) IV, ondansetron, senna-docusate, traZODone  Assesment: He was admitted with acute respiratory  failure and was septic on admission. He has some COPD and pulmonary fibrosis at baseline. He seems to have aspirated and is being treated for healthcare associated pneumonia. I think because of his sepsis he had more problems with liver and kidney function both of which seem to have improved. He's having more trouble with hypoxia today. His intake and output are negative since admission and based on his weights he is down 6 kg Principal Problem:   Sepsis Active Problems:   Hypertension   Atrial fibrillation   Acute on chronic diastolic heart failure   Pulmonary fibrosis   Acute renal failure   DM type 2 (diabetes mellitus, type 2)   HCAP (healthcare-associated pneumonia)   Acute respiratory failure with hypoxia   Hyperkalemia   Elevated transaminase level   Gastric distention    Plan: Chest x-ray today I will add BNP to lab work continue with his other treatments he probably will require BiPAP periodically    LOS: 4 days   [name] Sherman L 06/11/2014, 8:14 AM

## 2014-06-12 ENCOUNTER — Inpatient Hospital Stay (HOSPITAL_COMMUNITY): Payer: Medicare Other

## 2014-06-12 LAB — GLUCOSE, CAPILLARY
GLUCOSE-CAPILLARY: 171 mg/dL — AB (ref 70–99)
GLUCOSE-CAPILLARY: 252 mg/dL — AB (ref 70–99)
Glucose-Capillary: 152 mg/dL — ABNORMAL HIGH (ref 70–99)
Glucose-Capillary: 220 mg/dL — ABNORMAL HIGH (ref 70–99)
Glucose-Capillary: 261 mg/dL — ABNORMAL HIGH (ref 70–99)

## 2014-06-12 LAB — HEPATIC FUNCTION PANEL
ALT: 661 U/L — AB (ref 0–53)
AST: 89 U/L — ABNORMAL HIGH (ref 0–37)
Albumin: 2.8 g/dL — ABNORMAL LOW (ref 3.5–5.2)
Alkaline Phosphatase: 46 U/L (ref 39–117)
BILIRUBIN DIRECT: 0.5 mg/dL — AB (ref 0.0–0.3)
BILIRUBIN TOTAL: 1.6 mg/dL — AB (ref 0.3–1.2)
Indirect Bilirubin: 1.1 mg/dL — ABNORMAL HIGH (ref 0.3–0.9)
Total Protein: 6 g/dL (ref 6.0–8.3)

## 2014-06-12 LAB — CULTURE, BLOOD (ROUTINE X 2)
CULTURE: NO GROWTH
Culture: NO GROWTH

## 2014-06-12 LAB — BASIC METABOLIC PANEL
Anion gap: 12 (ref 5–15)
BUN: 31 mg/dL — ABNORMAL HIGH (ref 6–23)
CHLORIDE: 94 meq/L — AB (ref 96–112)
CO2: 33 mEq/L — ABNORMAL HIGH (ref 19–32)
CREATININE: 1.3 mg/dL (ref 0.50–1.35)
Calcium: 8.5 mg/dL (ref 8.4–10.5)
GFR, EST AFRICAN AMERICAN: 60 mL/min — AB (ref >90)
GFR, EST NON AFRICAN AMERICAN: 52 mL/min — AB (ref >90)
Glucose, Bld: 167 mg/dL — ABNORMAL HIGH (ref 70–99)
Potassium: 3.9 mEq/L (ref 3.7–5.3)
Sodium: 139 mEq/L (ref 137–147)

## 2014-06-12 MED ORDER — FUROSEMIDE 40 MG PO TABS
40.0000 mg | ORAL_TABLET | Freq: Every day | ORAL | Status: DC
  Administered 2014-06-13 – 2014-06-17 (×5): 40 mg via ORAL
  Filled 2014-06-12 (×5): qty 1

## 2014-06-12 NOTE — Progress Notes (Signed)
Subjective: He says he feels better. He has no new complaints. He is concerned about getting his medications confused at home. He thinks that is part of the problem he had that caused him to be admitted this time. His breathing is better but he is still requiring BiPAP at night  Objective: Vital signs in last 24 hours: Temp:  [97.4 F (36.3 C)-98.4 F (36.9 C)] 97.4 F (36.3 C) (08/29 0807) Pulse Rate:  [67-93] 80 (08/29 0800) Resp:  [15-29] 19 (08/29 0800) BP: (97-165)/(65-97) 142/92 mmHg (08/29 0800) SpO2:  [83 %-99 %] 92 % (08/29 0805) FiO2 (%):  [35 %] 35 % (08/29 0758) Weight:  [73.1 kg (161 lb 2.5 oz)] 73.1 kg (161 lb 2.5 oz) (08/29 0430) Weight change: 0.5 kg (1 lb 1.6 oz) Last BM Date: 06/10/14  Intake/Output from previous day: 08/28 0701 - 08/29 0700 In: 1110 [P.O.:240; I.V.:120; IV Piggyback:750] Out: 1700 [Urine:3545]  PHYSICAL EXAM General appearance: alert, cooperative and mild distress Resp: He has chronic rales at in both lungs and has rhonchi bilaterally also Cardio: His heart is irregular GI: soft, non-tender; bowel sounds normal; no masses,  no organomegaly Extremities: He still has trace to 1+ edema  Lab Results:  Results for orders placed during the hospital encounter of 06/07/14 (from the past 48 hour(s))  VANCOMYCIN, TROUGH     Status: None   Collection Time    06/10/14 11:48 AM      Result Value Ref Range   Vancomycin Tr 11.2  10.0 - 20.0 ug/mL  GLUCOSE, CAPILLARY     Status: Abnormal   Collection Time    06/10/14 11:52 AM      Result Value Ref Range   Glucose-Capillary 249 (*) 70 - 99 mg/dL   Comment 1 Documented in Chart     Comment 2 Notify RN    GLUCOSE, CAPILLARY     Status: Abnormal   Collection Time    06/10/14  4:47 PM      Result Value Ref Range   Glucose-Capillary 196 (*) 70 - 99 mg/dL  GLUCOSE, CAPILLARY     Status: Abnormal   Collection Time    06/10/14  8:00 PM      Result Value Ref Range   Glucose-Capillary 291 (*) 70 - 99  mg/dL   Comment 1 Notify RN    GLUCOSE, CAPILLARY     Status: Abnormal   Collection Time    06/11/14 12:10 AM      Result Value Ref Range   Glucose-Capillary 246 (*) 70 - 99 mg/dL   Comment 1 Notify RN    GLUCOSE, CAPILLARY     Status: Abnormal   Collection Time    06/11/14  4:17 AM      Result Value Ref Range   Glucose-Capillary 177 (*) 70 - 99 mg/dL   Comment 1 Notify RN    BASIC METABOLIC PANEL     Status: Abnormal   Collection Time    06/11/14  4:54 AM      Result Value Ref Range   Sodium 141  137 - 147 mEq/L   Potassium 4.5  3.7 - 5.3 mEq/L   Chloride 97  96 - 112 mEq/L   CO2 33 (*) 19 - 32 mEq/L   Glucose, Bld 174 (*) 70 - 99 mg/dL   BUN 24 (*) 6 - 23 mg/dL   Creatinine, Ser 1.33  0.50 - 1.35 mg/dL   Calcium 8.5  8.4 - 10.5 mg/dL   GFR  calc non Af Amer 51 (*) >90 mL/min   GFR calc Af Amer 59 (*) >90 mL/min   Comment: (NOTE)     The eGFR has been calculated using the CKD EPI equation.     This calculation has not been validated in all clinical situations.     eGFR's persistently <90 mL/min signify possible Chronic Kidney     Disease.   Anion gap 11  5 - 15  HEPATIC FUNCTION PANEL     Status: Abnormal   Collection Time    06/11/14  4:54 AM      Result Value Ref Range   Total Protein 6.2  6.0 - 8.3 g/dL   Albumin 2.9 (*) 3.5 - 5.2 g/dL   AST 230 (*) 0 - 37 U/L   ALT 941 (*) 0 - 53 U/L   Alkaline Phosphatase 50  39 - 117 U/L   Total Bilirubin 1.9 (*) 0.3 - 1.2 mg/dL   Bilirubin, Direct 0.6 (*) 0.0 - 0.3 mg/dL   Indirect Bilirubin 1.3 (*) 0.3 - 0.9 mg/dL  CBC     Status: Abnormal   Collection Time    06/11/14  4:54 AM      Result Value Ref Range   WBC 9.1  4.0 - 10.5 K/uL   RBC 4.08 (*) 4.22 - 5.81 MIL/uL   Hemoglobin 13.9  13.0 - 17.0 g/dL   HCT 42.5  39.0 - 52.0 %   MCV 104.2 (*) 78.0 - 100.0 fL   MCH 34.1 (*) 26.0 - 34.0 pg   MCHC 32.7  30.0 - 36.0 g/dL   RDW 14.8  11.5 - 15.5 %   Platelets 163  150 - 400 K/uL  PRO B NATRIURETIC PEPTIDE     Status:  Abnormal   Collection Time    06/11/14  8:00 AM      Result Value Ref Range   Pro B Natriuretic peptide (BNP) 2818.0 (*) 0 - 450 pg/mL  GLUCOSE, CAPILLARY     Status: Abnormal   Collection Time    06/11/14  8:06 AM      Result Value Ref Range   Glucose-Capillary 213 (*) 70 - 99 mg/dL   Comment 1 Notify RN    GLUCOSE, CAPILLARY     Status: Abnormal   Collection Time    06/11/14 11:48 AM      Result Value Ref Range   Glucose-Capillary 274 (*) 70 - 99 mg/dL   Comment 1 Notify RN    GLUCOSE, CAPILLARY     Status: Abnormal   Collection Time    06/11/14  4:14 PM      Result Value Ref Range   Glucose-Capillary 254 (*) 70 - 99 mg/dL   Comment 1 Notify RN    GLUCOSE, CAPILLARY     Status: Abnormal   Collection Time    06/11/14  7:59 PM      Result Value Ref Range   Glucose-Capillary 288 (*) 70 - 99 mg/dL   Comment 1 Notify RN     Comment 2 Documented in Chart    GLUCOSE, CAPILLARY     Status: Abnormal   Collection Time    06/11/14  9:09 PM      Result Value Ref Range   Glucose-Capillary 212 (*) 70 - 99 mg/dL   Comment 1 Notify RN     Comment 2 Documented in Chart    GLUCOSE, CAPILLARY     Status: Abnormal   Collection Time  06/11/14 11:43 PM      Result Value Ref Range   Glucose-Capillary 162 (*) 70 - 99 mg/dL   Comment 1 Notify RN     Comment 2 Documented in Chart    GLUCOSE, CAPILLARY     Status: Abnormal   Collection Time    06/12/14  3:25 AM      Result Value Ref Range   Glucose-Capillary 171 (*) 70 - 99 mg/dL  HEPATIC FUNCTION PANEL     Status: Abnormal   Collection Time    06/12/14  4:48 AM      Result Value Ref Range   Total Protein 6.0  6.0 - 8.3 g/dL   Albumin 2.8 (*) 3.5 - 5.2 g/dL   AST 89 (*) 0 - 37 U/L   ALT 661 (*) 0 - 53 U/L   Alkaline Phosphatase 46  39 - 117 U/L   Total Bilirubin 1.6 (*) 0.3 - 1.2 mg/dL   Bilirubin, Direct 0.5 (*) 0.0 - 0.3 mg/dL   Indirect Bilirubin 1.1 (*) 0.3 - 0.9 mg/dL  BASIC METABOLIC PANEL     Status: Abnormal    Collection Time    06/12/14  4:48 AM      Result Value Ref Range   Sodium 139  137 - 147 mEq/L   Potassium 3.9  3.7 - 5.3 mEq/L   Chloride 94 (*) 96 - 112 mEq/L   CO2 33 (*) 19 - 32 mEq/L   Glucose, Bld 167 (*) 70 - 99 mg/dL   BUN 31 (*) 6 - 23 mg/dL   Creatinine, Ser 1.30  0.50 - 1.35 mg/dL   Calcium 8.5  8.4 - 10.5 mg/dL   GFR calc non Af Amer 52 (*) >90 mL/min   GFR calc Af Amer 60 (*) >90 mL/min   Comment: (NOTE)     The eGFR has been calculated using the CKD EPI equation.     This calculation has not been validated in all clinical situations.     eGFR's persistently <90 mL/min signify possible Chronic Kidney     Disease.   Anion gap 12  5 - 15  GLUCOSE, CAPILLARY     Status: Abnormal   Collection Time    06/12/14  7:48 AM      Result Value Ref Range   Glucose-Capillary 152 (*) 70 - 99 mg/dL   Comment 1 Documented in Chart     Comment 2 Notify RN      ABGS No results found for this basename: PHART, PCO2, PO2ART, TCO2, HCO3,  in the last 72 hours CULTURES Recent Results (from the past 240 hour(s))  CULTURE, BLOOD (ROUTINE X 2)     Status: None   Collection Time    06/07/14 10:45 AM      Result Value Ref Range Status   Specimen Description BLOOD RIGHT FOREARM DRAWN BY RN AMANDA   Final   Special Requests BOTTLES DRAWN AEROBIC ONLY 4CC   Final   Culture NO GROWTH 5 DAYS   Final   Report Status 06/12/2014 FINAL   Final  CULTURE, BLOOD (ROUTINE X 2)     Status: None   Collection Time    06/07/14 11:18 AM      Result Value Ref Range Status   Specimen Description BLOOD LEFT ARM   Final   Special Requests BOTTLES DRAWN AEROBIC AND ANAEROBIC [place]   Final   Culture NO GROWTH 5 DAYS   Final   Report Status 06/12/2014 FINAL  Final  URINE CULTURE     Status: None   Collection Time    06/07/14 12:30 PM      Result Value Ref Range Status   Specimen Description URINE, CATHETERIZED   Final   Special Requests NONE   Final   Culture  Setup Time     Final   Value: 06/07/2014  20:28     Performed at [facility]     Final   Value: NO GROWTH     Performed at [facility]   Culture     Final   Value: NO GROWTH     Performed at [facility]   Report Status 06/08/2014 FINAL   Final  MRSA PCR SCREENING     Status: None   Collection Time    06/07/14  2:05 PM      Result Value Ref Range Status   MRSA by PCR NEGATIVE  NEGATIVE Final   Comment:            The GeneXpert MRSA Assay (FDA     approved for NASAL specimens     only), is one component of a     comprehensive MRSA colonization     surveillance program. It is not     intended to diagnose MRSA     infection nor to guide or     monitor treatment for     MRSA infections.  CULTURE, EXPECTORATED SPUTUM-ASSESSMENT     Status: None   Collection Time    06/08/14  2:00 PM      Result Value Ref Range Status   Specimen Description SPUTUM EXPECTORATED   Final   Special Requests NONE   Final   Sputum evaluation     Final   Value: THIS SPECIMEN IS ACCEPTABLE. RESPIRATORY CULTURE REPORT TO FOLLOW.     Performed at [facility]   Report Status 06/09/2014 FINAL   Final  CULTURE, RESPIRATORY (NON-EXPECTORATED)     Status: None   Collection Time    06/08/14  2:00 PM      Result Value Ref Range Status   Specimen Description SPUTUM EXPECTORATED   Final   Special Requests NONE   Final   Gram Stain     Final   Value: MODERATE WBC PRESENT,BOTH PMN AND MONONUCLEAR     FEW SQUAMOUS EPITHELIAL CELLS PRESENT     RARE GRAM POSITIVE COCCI     IN PAIRS FEW YEAST     Performed at [facility]   Culture     Final   Value: MODERATE CANDIDA ALBICANS     Performed at [facility]   Report Status 06/11/2014 FINAL   Final   Studies/Results: Dg Chest Port 1 View  06/12/2014   CLINICAL DATA:  Followup chest radiograph.  Pneumonia.  EXAM: PORTABLE CHEST - 1 VIEW  COMPARISON:  06/11/2014.  FINDINGS: Bilateral irregular interstitial thickening with heterogeneous areas  of airspace opacity are stable. Stable cardiomegaly. No pneumothorax.  IMPRESSION: No change from the previous day's study.   Electronically Signed   By: Lajean Manes M.D.   On: 06/12/2014 07:38   Dg Chest Port 1 View  06/11/2014   CLINICAL DATA:  Pneumonia. Atrial fibrillation. Congestive heart failure. Pulmonary fibrosis. Acute renal failure.  EXAM: PORTABLE CHEST - 1 VIEW  COMPARISON:  06/09/2014  FINDINGS: Cardiomegaly stable. Diffuse bilateral airspace disease shows no significant change. Cardiomegaly stable. Elevation of left hemidiaphragm is again  demonstrated. No evidence of pleural effusion or pneumothorax.  IMPRESSION: No significant change compared with prior exam.   Electronically Signed   By: Earle Gell M.D.   On: 06/11/2014 09:34    Medications:  Prior to Admission:  Prescriptions prior to admission  Medication Sig Dispense Refill  . apixaban (ELIQUIS) 5 MG TABS tablet Take 5 mg by mouth 2 (two) times daily.      Marland Kitchen aspirin EC 81 MG tablet Take 81 mg by mouth daily.      Marland Kitchen atorvastatin (LIPITOR) 20 MG tablet Take 10 mg by mouth daily.      Marland Kitchen BEE POLLEN PO Take 1 capsule by mouth daily.      . furosemide (LASIX) 20 MG tablet Take 20 mg by mouth every Monday, Wednesday, and Friday.      . insulin detemir (LEVEMIR) 100 UNIT/ML injection Inject 0.2 mLs (20 Units total) into the skin daily.  10 mL  11  . latanoprost (XALATAN) 0.005 % ophthalmic solution Place 1 drop into the left eye at bedtime.      . magnesium oxide (MAG-OX) 400 MG tablet Take 400 mg by mouth daily at 6 PM.      . metFORMIN (GLUCOPHAGE) 500 MG tablet Take 1 tablet (500 mg total) by mouth 2 (two) times daily with a meal.  60 tablet  1  . metoprolol tartrate (LOPRESSOR) 25 MG tablet Take 25 mg by mouth 2 (two) times daily.      . Nintedanib Esylate (OFEV) 150 MG CAPS Take 1 capsule by mouth every 12 (twelve) hours.      . tadalafil (CIALIS) 20 MG tablet Take 20 mg by mouth daily as needed for erectile dysfunction.        Scheduled: . antiseptic oral rinse  7 mL Mouth Rinse q12n4p  . chlorhexidine  15 mL Mouth Rinse BID  . furosemide  40 mg Oral Daily  . insulin aspart  0-9 Units Subcutaneous 6 times per day  . latanoprost  1 drop Left Eye QHS  . levalbuterol  0.63 mg Nebulization TID  . methylPREDNISolone (SOLU-MEDROL) injection  40 mg Intravenous Q6H  . metoprolol tartrate  12.5 mg Oral BID  . pantoprazole  40 mg Oral QAC supper  . piperacillin-tazobactam (ZOSYN)  IV  3.375 g Intravenous Q8H  . vancomycin  1,500 mg Intravenous Q24H   Continuous:  OEV:[id number], acetaminophen, albuterol, alum & mag hydroxide-simeth, HYDROcodone-acetaminophen, LORazepam, ondansetron (ZOFRAN) IV, ondansetron, senna-docusate, traZODone  Assesment: He was admitted with acute respiratory failure. He has healthcare associated pneumonia and associated sepsis. This appeared to be related to aspiration. He is improving. He still requires BiPAP at night and may require that as an outpatient  He has pulmonary fibrosis and had been started on medication for that but that may have had side effects  He had marked gastric distention on admission  He has had renal and liver dysfunction probably related to sepsis which is improved Principal Problem:   Sepsis Active Problems:   Hypertension   Atrial fibrillation   Acute on chronic diastolic heart failure   Pulmonary fibrosis   Acute renal failure   DM type 2 (diabetes mellitus, type 2)   HCAP (healthcare-associated pneumonia)   Acute respiratory failure with hypoxia   Hyperkalemia   Elevated transaminase level   Gastric distention    Plan: He may be able to be transferred out of the step down unit. His oxygenation has been something of a problem.. I think he will  require BiPAP at night    LOS: 5 days   Nayra Coury L 06/12/2014, 10:10 AM

## 2014-06-12 NOTE — Progress Notes (Signed)
Pt transferring to floor. Pt is alert and oriented, assessment is unchanged from this morning. All belongings transferred with pt. Report called to receiving nurse.

## 2014-06-12 NOTE — Progress Notes (Signed)
[facility] PROGRESS NOTE  [name] Sherman ZOX:[id number] DOB: Aug 23, 1941 DOA: 06/07/2014 PCP: [name], PA-C    Code Status: Full code Family Communication: Discussed with patient. No family at the bedside. Disposition Plan: To be determined.   Consultants:  Pulmonologist, Dr. Juanetta Gosling  Curbside consultation with intensivist, Sandrea Hughs M.D.  Gastroenterology  Procedures:  BiPAP  Antibiotics:  Vancomycin 06/07/14>>   Zosyn 06/07/14>>  HPI/Subjective: Feels that breathing is improving. No chest pain.  Continues to have productive cough.  Required bipap overnight  Objective: Filed Vitals:   06/12/14 0807  BP:   Pulse:   Temp: 97.4 F (36.3 C)  Resp:     Intake/Output Summary (Last 24 hours) at 06/12/14 8119 Last data filed at 06/12/14 0600  Gross per 24 hour  Intake   1110 ml  Output   2850 ml  Net  -1740 ml   Filed Weights   06/10/14 0500 06/11/14 0500 06/12/14 0430  Weight: 72.576 kg (160 lb) 72.6 kg (160 lb 0.9 oz) 73.1 kg (161 lb 2.5 oz)    Exam:   General:  Pleasant, alert 73 year old African-American man in no acute distress,  Cardiovascular: Irregular, irregular. no pedal edema bilaterally  Respiratory: Fine diffuse crackles bilaterally at bases. Mild expiratory wheezing. Breathing mildly labored at rest.  Abdomen: Positive bowel sounds, soft, no appreciable distention or rigidity; nontender.  Musculoskeletal: No acute hot red joints. Pedal pulses palpable.  Neurologic: He is alert and oriented x3. His speech is clear.   Data Reviewed: Basic Metabolic Panel:  Recent Labs Lab 06/08/14 0503 06/09/14 0434 06/10/14 0448 06/11/14 0454 06/12/14 0448  NA 139 140 141 141 139  K 5.0 4.2 4.5 4.5 3.9  CL 98 101 100 97 94*  CO2 33* 33*  GLUCOSE 131* 163* 183* 174* 167*  BUN 32* 30* 27* 24* 31*  CREATININE 1.80* 1.56* 1.45* 1.33 1.30  CALCIUM 8.3* 8.0* 8.5 8.5 8.5   Liver Function Tests:  Recent Labs Lab  06/08/14 0503 06/09/14 0434 06/10/14 0448 06/11/14 0454 06/12/14 0448  AST 2096* 718* 468* 230* 89*  ALT 1316* 1071* 1124* 941* 661*  ALKPHOS 51 50 49 50 46  BILITOT 1.9* 2.6* 2.3* 1.9* 1.6*  PROT 6.6 5.9* 6.1 6.2 6.0  ALBUMIN 3.2* 2.8* 2.8* 2.9* 2.8*   No results found for this basename: LIPASE, AMYLASE,  in the last 168 hours No results found for this basename: AMMONIA,  in the last 168 hours CBC:  Recent Labs Lab 06/07/14 1008 06/08/14 0503 06/09/14 0434 06/10/14 0448 06/11/14 0454  WBC 13.9* 8.4 9.5 11.2* 9.1  HGB 15.8 14.0 13.9 14.0 13.9  HCT 50.1 43.4 42.6 43.6 42.5  MCV 110.4* 106.1* 103.6* 104.1* 104.2*  PLT 161 144* 156 173 163   Cardiac Enzymes:  Recent Labs Lab 06/07/14 1008  TROPONINI <0.30   BNP (last 3 results)  Recent Labs  04/30/14 1115 06/07/14 1008 06/11/14 0800  PROBNP 1929.0* 10442.0* 2818.0*   CBG:  Recent Labs Lab 06/11/14 1959 06/11/14 2109 06/11/14 2343 06/12/14 0325 06/12/14 0748  GLUCAP 288* 212* 162* 171* 152*    Recent Results (from the past 240 hour(s))  CULTURE, BLOOD (ROUTINE X 2)     Status: None   Collection Time    06/07/14 10:45 AM      Result Value Ref Range Status   Specimen Description BLOOD RIGHT FOREARM DRAWN BY RN [facility]   Final   Special Requests BOTTLES DRAWN AEROBIC ONLY 4CC   Final  Culture NO GROWTH 5 DAYS   Final   Report Status 06/12/2014 FINAL   Final  CULTURE, BLOOD (ROUTINE X 2)     Status: None   Collection Time    06/07/14 11:18 AM      Result Value Ref Range Status   Specimen Description BLOOD LEFT ARM   Final   Special Requests BOTTLES DRAWN AEROBIC AND ANAEROBIC 6CC   Final   Culture NO GROWTH 5 DAYS   Final   Report Status 06/12/2014 FINAL   Final  URINE CULTURE     Status: None   Collection Time    06/07/14 12:30 PM      Result Value Ref Range Status   Specimen Description URINE, CATHETERIZED   Final   Special Requests NONE   Final   Culture  Setup Time     Final   Value:  06/07/2014 20:28     Performed at [facility]     Final   Value: NO GROWTH     Performed at [facility]   Culture     Final   Value: NO GROWTH     Performed at [facility]   Report Status 06/08/2014 FINAL   Final  MRSA PCR SCREENING     Status: None   Collection Time    06/07/14  2:05 PM      Result Value Ref Range Status   MRSA by PCR NEGATIVE  NEGATIVE Final   Comment:            The GeneXpert MRSA Assay (FDA     approved for NASAL specimens     only), is one component of a     comprehensive MRSA colonization     surveillance program. It is not     intended to diagnose MRSA     infection nor to guide or     monitor treatment for     MRSA infections.  CULTURE, EXPECTORATED SPUTUM-ASSESSMENT     Status: None   Collection Time    06/08/14  2:00 PM      Result Value Ref Range Status   Specimen Description SPUTUM EXPECTORATED   Final   Special Requests NONE   Final   Sputum evaluation     Final   Value: THIS SPECIMEN IS ACCEPTABLE. RESPIRATORY CULTURE REPORT TO FOLLOW.     Performed at [facility]   Report Status 06/09/2014 FINAL   Final  CULTURE, RESPIRATORY (NON-EXPECTORATED)     Status: None   Collection Time    06/08/14  2:00 PM      Result Value Ref Range Status   Specimen Description SPUTUM EXPECTORATED   Final   Special Requests NONE   Final   Gram Stain     Final   Value: MODERATE WBC PRESENT,BOTH PMN AND MONONUCLEAR     FEW SQUAMOUS EPITHELIAL CELLS PRESENT     RARE GRAM POSITIVE COCCI     IN PAIRS FEW YEAST     Performed at [facility]   Culture     Final   Value: MODERATE CANDIDA ALBICANS     Performed at [facility]   Report Status 06/11/2014 FINAL   Final     Studies: Dg Chest Port 1 View  06/12/2014   CLINICAL DATA:  Followup chest radiograph.  Pneumonia.  EXAM: PORTABLE CHEST - 1 VIEW  COMPARISON:  06/11/2014.  FINDINGS: Bilateral irregular interstitial thickening with heterogeneous  areas of airspace opacity are stable. Stable cardiomegaly. No pneumothorax.  IMPRESSION: No change from the previous day's study.   Electronically Signed   By: Amie Portland M.D.   On: 06/12/2014 07:38   Dg Chest Port 1 View  06/11/2014   CLINICAL DATA:  Pneumonia. Atrial fibrillation. Congestive heart failure. Pulmonary fibrosis. Acute renal failure.  EXAM: PORTABLE CHEST - 1 VIEW  COMPARISON:  06/09/2014  FINDINGS: Cardiomegaly stable. Diffuse bilateral airspace disease shows no significant change. Cardiomegaly stable. Elevation of left hemidiaphragm is again demonstrated. No evidence of pleural effusion or pneumothorax.  IMPRESSION: No significant change compared with prior exam.   Electronically Signed   By: Myles Rosenthal M.D.   On: 06/11/2014 09:34    Scheduled Meds: . antiseptic oral rinse  7 mL Mouth Rinse q12n4p  . chlorhexidine  15 mL Mouth Rinse BID  . furosemide  40 mg Oral Daily  . insulin aspart  0-9 Units Subcutaneous 6 times per day  . latanoprost  1 drop Left Eye QHS  . levalbuterol  0.63 mg Nebulization TID  . methylPREDNISolone (SOLU-MEDROL) injection  40 mg Intravenous Q6H  . metoprolol tartrate  12.5 mg Oral BID  . pantoprazole  40 mg Oral QAC supper  . piperacillin-tazobactam (ZOSYN)  IV  3.375 g Intravenous Q8H  . vancomycin  1,500 mg Intravenous Q24H   Continuous Infusions:   Assessment and plan:  Principal Problem:   Sepsis Active Problems:   Hypertension   Atrial fibrillation   Acute on chronic diastolic heart failure   Pulmonary fibrosis   Acute renal failure   DM type 2 (diabetes mellitus, type 2)   HCAP (healthcare-associated pneumonia)   Acute respiratory failure with hypoxia   Hyperkalemia   Elevated transaminase level   Gastric distention    1. Sepsis with lactic acidosis, presumed to be secondary to healthcare acquired pneumonia and/or aspiration pneumonia. Blood pressure has remained stable and he has not required pressors. His white blood cell  count has improved. He was intermittently hypothermic and mildly febrile, but his temperature is within normal limits. Blood/urine cultures have not shown any growth. Sputum culture does not show any significant growth. Continue current antibiotics 2. Possible superimposed pneumonitis versus aspiration pneumonitis.  We'll continue antibiotics as above. Also on IV Pepcid. 3. Severe pulmonary fibrosis. He is followed by Dr. Juanetta Gosling. Dr. Juanetta Gosling plans to refer the patient to the pulmonary fibrosis clinic at [facility]. He was recently started on a new medication for pulmonary fibrosis, OFEV. The patient had not been feeling well since starting this medication. It has been held. He is currently on IV steroids 4. Acute on Chronic diastolic heart failure with possible acute diastolic failure. Patient had elevated BNP and chest x-ray indicated underlying pulmonary edema. Clinically he had evidence of peripheral edema. He was diuresed with intravenous lasix. Echocardiogram shows preserved ejection fraction. He appears to be approaching euvoluemia, so will transition to oral lasix. -Acute respiratory failure with hypoxia. Etiology multifactorial including HC AP/possible aspiration pneumonia/pneumonitis/severe pulmonary edema/and chronic heart failure. Patient has required bipap therapy qhs.  Will continue to wean off oxygen as tolerated. Currently on nasal cannula this morning 5. Query gastric outlet obstruction/marked gaseous distention of the stomach on CT. He's not having any abdominal pain, nausea, vomiting. Seen by gastroenterology and plans are for endoscopy once respiratory status stabilizes. 6. Elevated LFTs, hepatitis. His AST and ALT  increased into the thousands. Etiology unclear. Viral markers were unremarkable an abdominal ultrasound did not have any significant  findings. He has known fatty liver disease. Statin has been discontinued. Gastroenterology is following. May also be related to ischemia in  setting of sepsis. Liver enzymes are trending down. Continue to follow 7. Acute renal failure. This is likely secondary to ATN and/or prerenal azotemia from his illness. He did have signs of volume overload and has diuresed well with lasix. Renal function has improved with lasix. He appears to be approaching euvoluemia, so lasix will be changed to po. Continue to follow. 8. Chronic atrial fibrillation His rate is currently controlled. Will continue low dosing of metoprolol. Anticoagulation currently on hold in anticipation of EGD. Aspirin discontinued. 9. Diabetes mellitus, type II; hypoglycemic on admission. Status post D50 in the ED. His blood glucose is better. He is on sliding scale NovoLog with the recent start of IV steroids.Maryclare Labrador continue to monitor.  Time spent:    Lerlene Treadwell  [facility] Pager [phone]. If 7PM-7AM, please contact night-coverage at [email or web address], password [facility] 06/12/2014, 9:06 AM  LOS: 5 days

## 2014-06-13 LAB — HEPATIC FUNCTION PANEL
ALT: 464 U/L — ABNORMAL HIGH (ref 0–53)
AST: 40 U/L — ABNORMAL HIGH (ref 0–37)
Albumin: 3 g/dL — ABNORMAL LOW (ref 3.5–5.2)
Alkaline Phosphatase: 44 U/L (ref 39–117)
Bilirubin, Direct: 0.7 mg/dL — ABNORMAL HIGH (ref 0.0–0.3)
Indirect Bilirubin: 1 mg/dL — ABNORMAL HIGH (ref 0.3–0.9)
Total Bilirubin: 1.7 mg/dL — ABNORMAL HIGH (ref 0.3–1.2)
Total Protein: 6.3 g/dL (ref 6.0–8.3)

## 2014-06-13 LAB — BASIC METABOLIC PANEL
Anion gap: 14 (ref 5–15)
BUN: 37 mg/dL — ABNORMAL HIGH (ref 6–23)
CO2: 33 mEq/L — ABNORMAL HIGH (ref 19–32)
Calcium: 8.8 mg/dL (ref 8.4–10.5)
Chloride: 92 mEq/L — ABNORMAL LOW (ref 96–112)
Creatinine, Ser: 1.29 mg/dL (ref 0.50–1.35)
GFR calc Af Amer: 61 mL/min — ABNORMAL LOW (ref >90)
GFR calc non Af Amer: 53 mL/min — ABNORMAL LOW (ref >90)
Glucose, Bld: 216 mg/dL — ABNORMAL HIGH (ref 70–99)
Potassium: 4.1 mEq/L (ref 3.7–5.3)
Sodium: 139 mEq/L (ref 137–147)

## 2014-06-13 LAB — GLUCOSE, CAPILLARY
GLUCOSE-CAPILLARY: 200 mg/dL — AB (ref 70–99)
GLUCOSE-CAPILLARY: 293 mg/dL — AB (ref 70–99)
Glucose-Capillary: 195 mg/dL — ABNORMAL HIGH (ref 70–99)
Glucose-Capillary: 197 mg/dL — ABNORMAL HIGH (ref 70–99)
Glucose-Capillary: 224 mg/dL — ABNORMAL HIGH (ref 70–99)
Glucose-Capillary: 279 mg/dL — ABNORMAL HIGH (ref 70–99)

## 2014-06-13 MED ORDER — PIPERACILLIN-TAZOBACTAM 3.375 G IVPB
INTRAVENOUS | Status: AC
  Filled 2014-06-13: qty 100

## 2014-06-13 NOTE — Progress Notes (Signed)
ANTIBIOTIC CONSULT NOTE - follow up  Pharmacy Consult for Vancomycin & Zosyn Indication: sepsis, PNA  No Known Allergies  Patient Measurements: Height:  (180.3 cm) Weight: 161 lb 2.5 oz (73.1 kg) IBW/kg (Calculated) : 75.3  Vital Signs: Temp: 97.4 F (36.3 C) (08/30 0529) Temp src: Oral (08/30 0529) BP: 139/87 mmHg (08/30 0529) Pulse Rate: 66 (08/30 0529) Intake/Output from previous day: 08/29 0701 - 08/30 0700 In: 1270 [P.O.:720; IV Piggyback:550] Out: 975 [Urine:975] Intake/Output from this shift: Total I/O In: 240 [P.O.:240] Out: 600 [Urine:600]  Labs:  Recent Labs  06/11/14 0454 06/12/14 0448 06/13/14 0800  WBC 9.1  --   --   HGB 13.9  --   --   PLT 163  --   --   CREATININE 1.33 1.30 1.29   Estimated Creatinine Clearance: 51.2 ml/min (by C-G formula based on Cr of 1.29). No results found for this basename: VANCOTROUGH, VANCOPEAK, VANCORANDOM, GENTTROUGH, GENTPEAK, GENTRANDOM, TOBRATROUGH, TOBRAPEAK, TOBRARND, AMIKACINPEAK, AMIKACINTROU, AMIKACIN,  in the last 72 hours   Microbiology: Recent Results (from the past 720 hour(s))  CULTURE, BLOOD (ROUTINE X 2)     Status: None   Collection Time    06/07/14 10:45 AM      Result Value Ref Range Status   Specimen Description BLOOD RIGHT FOREARM DRAWN BY RN AMANDA   Final   Special Requests BOTTLES DRAWN AEROBIC ONLY 4CC   Final   Culture NO GROWTH 5 DAYS   Final   Report Status 06/12/2014 FINAL   Final  CULTURE, BLOOD (ROUTINE X 2)     Status: None   Collection Time    06/07/14 11:18 AM      Result Value Ref Range Status   Specimen Description BLOOD LEFT ARM   Final   Special Requests BOTTLES DRAWN AEROBIC AND ANAEROBIC 6CC   Final   Culture NO GROWTH 5 DAYS   Final   Report Status 06/12/2014 FINAL   Final  URINE CULTURE     Status: None   Collection Time    06/07/14 12:30 PM      Result Value Ref Range Status   Specimen Description URINE, CATHETERIZED   Final   Special Requests NONE   Final   Culture  Setup Time     Final   Value: 06/07/2014 20:28     Performed at [facility] Count     Final   Value: NO GROWTH     Performed at [facility]   Culture     Final   Value: NO GROWTH     Performed at [facility]   Report Status 06/08/2014 FINAL   Final  MRSA PCR SCREENING     Status: None   Collection Time    06/07/14  2:05 PM      Result Value Ref Range Status   MRSA by PCR NEGATIVE  NEGATIVE Final   Comment:            The GeneXpert MRSA Assay (FDA     approved for NASAL specimens     only), is one component of a     comprehensive MRSA colonization     surveillance program. It is not     intended to diagnose MRSA     infection nor to guide or     monitor treatment for     MRSA infections.  CULTURE, EXPECTORATED SPUTUM-ASSESSMENT     Status: None   Collection Time  06/08/14  2:00 PM      Result Value Ref Range Status   Specimen Description SPUTUM EXPECTORATED   Final   Special Requests NONE   Final   Sputum evaluation     Final   Value: THIS SPECIMEN IS ACCEPTABLE. RESPIRATORY CULTURE REPORT TO FOLLOW.     Performed at [facility]   Report Status 06/09/2014 FINAL   Final  CULTURE, RESPIRATORY (NON-EXPECTORATED)     Status: None   Collection Time    06/08/14  2:00 PM      Result Value Ref Range Status   Specimen Description SPUTUM EXPECTORATED   Final   Special Requests NONE   Final   Gram Stain     Final   Value: MODERATE WBC PRESENT,BOTH PMN AND MONONUCLEAR     FEW SQUAMOUS EPITHELIAL CELLS PRESENT     RARE GRAM POSITIVE COCCI     IN PAIRS FEW YEAST     Performed at [facility]   Culture     Final   Value: MODERATE CANDIDA ALBICANS     Performed at [facility]   Report Status 06/11/2014 FINAL   Final   Medical History: Past Medical History  Diagnosis Date  . Essential hypertension, benign   . Type 2 diabetes mellitus   . Atrial fibrillation     Dr. [name] [facility]  . Mixed  hyperlipidemia   . Pulmonary fibrosis     dx 11/2013  . Pneumonia     11/2013  . Chronic diastolic heart failure   . On home O2     2L N/C prn  . COPD (chronic obstructive pulmonary disease)    Medications:  Scheduled:  . antiseptic oral rinse  7 mL Mouth Rinse q12n4p  . chlorhexidine  15 mL Mouth Rinse BID  . furosemide  40 mg Oral Daily  . insulin aspart  0-9 Units Subcutaneous 6 times per day  . latanoprost  1 drop Left Eye QHS  . levalbuterol  0.63 mg Nebulization TID  . methylPREDNISolone (SOLU-MEDROL) injection  40 mg Intravenous Q6H  . metoprolol tartrate  12.5 mg Oral BID  . pantoprazole  40 mg Oral QAC supper  . piperacillin-tazobactam (ZOSYN)  IV  3.375 g Intravenous Q8H  . vancomycin  1,500 mg Intravenous Q24H   Assessment: 73 yo M who presents with shortness of breath.  He was hospitalized ~ 1 month with PNA.   Scr stable, currently afebrile and normal WBC when last checked..   Cx data Candida growing in sputum cx- likely contaminant. BCx (-)  Day #7 Vancomycin and Zosyn Vancomycin 8/24>> Zosyn 8/24>>  Goal of Therapy:  Vancomycin trough level 15-20 mcg/ml  Plan:  Continue Zosyn 3.375gm IV Q8h to be infused over 4hrs Increase Vancomycin 1500 IV q24h Weekly Vancomycin trough  Monitor renal function and cx data  Narrow vs. D/C abx. Lamonte Richer R 06/13/2014,1:17 PM

## 2014-06-13 NOTE — Progress Notes (Signed)
[facility] PROGRESS NOTE  [name] Sherman WJX:[id number] DOB: 09/09/1941 DOA: 06/07/2014 PCP: [name], PA-C    Code Status: Full code Family Communication: Discussed with patient. No family at the bedside. Disposition Plan: To be determined.   Consultants:  Pulmonologist, Dr. Juanetta Gosling  Curbside consultation with intensivist, Sandrea Hughs M.D.  Gastroenterology  Procedures:  BiPAP  Antibiotics:  Vancomycin 06/07/14>>   Zosyn 06/07/14>>  HPI/Subjective: Feels that breathing is improving. Still has productive cough. No Other complaints  Objective: Filed Vitals:   06/13/14 1335  BP: 118/76  Pulse: 80  Temp: 97.6 F (36.4 C)  Resp: 20    Intake/Output Summary (Last 24 hours) at 06/13/14 2022 Last data filed at 06/13/14 1700  Gross per 24 hour  Intake    720 ml  Output   1300 ml  Net   -580 ml   Filed Weights   06/10/14 0500 06/11/14 0500 06/12/14 0430  Weight: 72.576 kg (160 lb) 72.6 kg (160 lb 0.9 oz) 73.1 kg (161 lb 2.5 oz)    Exam:   General:  Pleasant, alert 73 year old African-American man in no acute distress,  Cardiovascular: Irregular, irregular. Trace pedal edema bilaterally  Respiratory: Crackles at right base. Breathing mildly labored at rest.  Abdomen: Positive bowel sounds, soft, no appreciable distention or rigidity; nontender.  Musculoskeletal: No acute hot red joints. Pedal pulses palpable.  Neurologic: He is alert and oriented x3. His speech is clear.   Data Reviewed: Basic Metabolic Panel:  Recent Labs Lab 06/09/14 0434 06/10/14 0448 06/11/14 0454 06/12/14 0448 06/13/14 0800  NA 140 141 141 139 139  K 4.2 4.5 4.5 3.9 4.1  CL 101 100 97 94* 92*  CO2 28 29 33* 33* 33*  GLUCOSE 163* 183* 174* 167* 216*  BUN 30* 27* 24* 31* 37*  CREATININE 1.56* 1.45* 1.33 1.30 1.29  CALCIUM 8.0* 8.5 8.5 8.5 8.8   Liver Function Tests:  Recent Labs Lab 06/09/14 0434 06/10/14 0448 06/11/14 0454 06/12/14 0448  06/13/14 0800  AST 718* 468* 230* 89* 40*  ALT 1071* 1124* 941* 661* 464*  ALKPHOS 50 49 50 46 44  BILITOT 2.6* 2.3* 1.9* 1.6* 1.7*  PROT 5.9* 6.1 6.2 6.0 6.3  ALBUMIN 2.8* 2.8* 2.9* 2.8* 3.0*   No results found for this basename: LIPASE, AMYLASE,  in the last 168 hours No results found for this basename: AMMONIA,  in the last 168 hours CBC:  Recent Labs Lab 06/07/14 1008 06/08/14 0503 06/09/14 0434 06/10/14 0448 06/11/14 0454  WBC 13.9* 8.4 9.5 11.2* 9.1  HGB 15.8 14.0 13.9 14.0 13.9  HCT 50.1 43.4 42.6 43.6 42.5  MCV 110.4* 106.1* 103.6* 104.1* 104.2*  PLT 161 144* 156 173 163   Cardiac Enzymes:  Recent Labs Lab 06/07/14 1008  TROPONINI <0.30   BNP (last 3 results)  Recent Labs  04/30/14 1115 06/07/14 1008 06/11/14 0800  PROBNP 1929.0* 10442.0* 2818.0*   CBG:  Recent Labs Lab 06/13/14 0031 06/13/14 0524 06/13/14 0806 06/13/14 1148 06/13/14 1628  GLUCAP 197* 200* 195* 293* 224*    Recent Results (from the past 240 hour(s))  CULTURE, BLOOD (ROUTINE X 2)     Status: None   Collection Time    06/07/14 10:45 AM      Result Value Ref Range Status   Specimen Description BLOOD RIGHT FOREARM DRAWN BY RN AMANDA   Final   Special Requests BOTTLES DRAWN AEROBIC ONLY 4CC   Final   Culture NO GROWTH 5 DAYS  Final   Report Status 06/12/2014 FINAL   Final  CULTURE, BLOOD (ROUTINE X 2)     Status: None   Collection Time    06/07/14 11:18 AM      Result Value Ref Range Status   Specimen Description BLOOD LEFT ARM   Final   Special Requests BOTTLES DRAWN AEROBIC AND ANAEROBIC 6CC   Final   Culture NO GROWTH 5 DAYS   Final   Report Status 06/12/2014 FINAL   Final  URINE CULTURE     Status: None   Collection Time    06/07/14 12:30 PM      Result Value Ref Range Status   Specimen Description URINE, CATHETERIZED   Final   Special Requests NONE   Final   Culture  Setup Time     Final   Value: 06/07/2014 20:28     Performed at [facility]  Count     Final   Value: NO GROWTH     Performed at [facility]   Culture     Final   Value: NO GROWTH     Performed at [facility]   Report Status 06/08/2014 FINAL   Final  MRSA PCR SCREENING     Status: None   Collection Time    06/07/14  2:05 PM      Result Value Ref Range Status   MRSA by PCR NEGATIVE  NEGATIVE Final   Comment:            The GeneXpert MRSA Assay (FDA     approved for NASAL specimens     only), is one component of a     comprehensive MRSA colonization     surveillance program. It is not     intended to diagnose MRSA     infection nor to guide or     monitor treatment for     MRSA infections.  CULTURE, EXPECTORATED SPUTUM-ASSESSMENT     Status: None   Collection Time    06/08/14  2:00 PM      Result Value Ref Range Status   Specimen Description SPUTUM EXPECTORATED   Final   Special Requests NONE   Final   Sputum evaluation     Final   Value: THIS SPECIMEN IS ACCEPTABLE. RESPIRATORY CULTURE REPORT TO FOLLOW.     Performed at [facility]   Report Status 06/09/2014 FINAL   Final  CULTURE, RESPIRATORY (NON-EXPECTORATED)     Status: None   Collection Time    06/08/14  2:00 PM      Result Value Ref Range Status   Specimen Description SPUTUM EXPECTORATED   Final   Special Requests NONE   Final   Gram Stain     Final   Value: MODERATE WBC PRESENT,BOTH PMN AND MONONUCLEAR     FEW SQUAMOUS EPITHELIAL CELLS PRESENT     RARE GRAM POSITIVE COCCI     IN PAIRS FEW YEAST     Performed at [facility]   Culture     Final   Value: MODERATE CANDIDA ALBICANS     Performed at [facility]   Report Status 06/11/2014 FINAL   Final     Studies: Dg Chest Port 1 View  06/12/2014   CLINICAL DATA:  Followup chest radiograph.  Pneumonia.  EXAM: PORTABLE CHEST - 1 VIEW  COMPARISON:  06/11/2014.  FINDINGS: Bilateral irregular interstitial thickening with heterogeneous areas of airspace opacity are stable. Stable  cardiomegaly. No  pneumothorax.  IMPRESSION: No change from the previous day's study.   Electronically Signed   By: Amie Portland M.D.   On: 06/12/2014 07:38    Scheduled Meds: . antiseptic oral rinse  7 mL Mouth Rinse q12n4p  . chlorhexidine  15 mL Mouth Rinse BID  . furosemide  40 mg Oral Daily  . insulin aspart  0-9 Units Subcutaneous 6 times per day  . latanoprost  1 drop Left Eye QHS  . levalbuterol  0.63 mg Nebulization TID  . methylPREDNISolone (SOLU-MEDROL) injection  40 mg Intravenous Q6H  . metoprolol tartrate  12.5 mg Oral BID  . pantoprazole  40 mg Oral QAC supper  . piperacillin-tazobactam (ZOSYN)  IV  3.375 g Intravenous Q8H  . vancomycin  1,500 mg Intravenous Q24H   Continuous Infusions:   Assessment and plan:  Principal Problem:   Sepsis Active Problems:   Hypertension   Atrial fibrillation   Acute on chronic diastolic heart failure   Pulmonary fibrosis   Acute renal failure   DM type 2 (diabetes mellitus, type 2)   HCAP (healthcare-associated pneumonia)   Acute respiratory failure with hypoxia   Hyperkalemia   Elevated transaminase level   Gastric distention    1. Sepsis with lactic acidosis, presumed to be secondary to healthcare acquired pneumonia and/or aspiration pneumonia. Blood pressure has remained stable and he has not required pressors. His white blood cell count has improved. He was intermittently hypothermic and mildly febrile, but his temperature is within normal limits. Blood/urine cultures have not shown any growth. Sputum culture does not show any significant growth. We'll plan on discontinuing antibiotics after today's dose 2. Possible superimposed pneumonitis versus aspiration pneumonitis.  We'll plan on discontinuing antibiotics after today's dose. Also on IV Pepcid. 3. Severe pulmonary fibrosis. He is followed by Dr. Juanetta Gosling. Dr. Juanetta Gosling plans to refer the patient to the pulmonary fibrosis clinic at [facility]. He was recently started on a new medication for  pulmonary fibrosis, OFEV. The patient had not been feeling well since starting this medication. It has been held. He is currently on IV steroids 4. Acute on Chronic diastolic heart failure with possible acute diastolic failure. Patient had elevated BNP and chest x-ray indicated underlying pulmonary edema. Clinically he had evidence of peripheral edema. He was diuresed with intravenous lasix. Echocardiogram shows preserved ejection fraction. Now on oral Lasix. -Acute respiratory failure with hypoxia. Etiology multifactorial including HC AP/possible aspiration pneumonia/pneumonitis/severe pulmonary edema/and chronic heart failure. Patient has required bipap therapy qhs.  Will continue to wean off oxygen as tolerated. Currently on nasal cannula this morning. Per pulmonology, patient will likely need BiPAP each bedtime on discharge 5. Query gastric outlet obstruction/marked gaseous distention of the stomach on CT. He's not having any abdominal pain, nausea, vomiting. Seen by gastroenterology and plans are for endoscopy once respiratory status stabilizes. 6. Elevated LFTs, hepatitis. His AST and ALT  increased into the thousands. Etiology unclear. Viral markers were unremarkable an abdominal ultrasound did not have any significant findings. He has known fatty liver disease. Statin has been discontinued. Gastroenterology is following. May also be related to ischemia in setting of sepsis. Liver enzymes are trending down. Continue to follow 7. Acute renal failure. This is likely secondary to ATN and/or prerenal azotemia from his illness. He did have signs of volume overload and has diuresed well with lasix. Renal function has improved with lasix. Now on by mouth Lasix. Continue to follow. 8. Chronic atrial fibrillation His rate is currently controlled.  Will continue low dosing of metoprolol. Anticoagulation currently on hold in anticipation of EGD. Aspirin discontinued. 9. Diabetes mellitus, type II;  hypoglycemic on admission. Status post D50 in the ED. His blood glucose is better. He is on sliding scale NovoLog with the recent start of IV steroids.Maryclare Labrador continue to monitor.  Time spent:    Jahniya Duzan  [facility] Pager [phone]. If 7PM-7AM, please contact night-coverage at [email or web address], password [facility] 06/13/2014, 8:22 PM  LOS: 6 days

## 2014-06-13 NOTE — Progress Notes (Signed)
Subjective: He says he feels better. He has no new complaints. His cough is about the same.  Objective: Vital signs in last 24 hours: Temp:  [97.4 F (36.3 C)-98.2 F (36.8 C)] 97.4 F (36.3 C) (08/30 0529) Pulse Rate:  [66-98] 66 (08/30 0529) Resp:  [20-28] 20 (08/30 0529) BP: (108-143)/(70-89) 139/87 mmHg (08/30 0529) SpO2:  [79 %-95 %] 79 % (08/30 0730) Weight change:  Last BM Date: 06/10/14  Intake/Output from previous day: 08/29 0701 - 08/30 0700 In: 1270 [P.O.:720; IV Piggyback:550] Out: 975 [Urine:975]  PHYSICAL EXAM General appearance: alert, cooperative and mild distress Resp: rhonchi bilaterally Cardio: He is in atrial fibrillation with well-controlled ventricular response GI: soft, non-tender; bowel sounds normal; no masses,  no organomegaly Extremities: extremities normal, atraumatic, no cyanosis or edema  Lab Results:  Results for orders placed during the hospital encounter of 06/07/14 (from the past 48 hour(s))  GLUCOSE, CAPILLARY     Status: Abnormal   Collection Time    06/11/14 11:48 AM      Result Value Ref Range   Glucose-Capillary 274 (*) 70 - 99 mg/dL   Comment 1 Notify RN    GLUCOSE, CAPILLARY     Status: Abnormal   Collection Time    06/11/14  4:14 PM      Result Value Ref Range   Glucose-Capillary 254 (*) 70 - 99 mg/dL   Comment 1 Notify RN    GLUCOSE, CAPILLARY     Status: Abnormal   Collection Time    06/11/14  7:59 PM      Result Value Ref Range   Glucose-Capillary 288 (*) 70 - 99 mg/dL   Comment 1 Notify RN     Comment 2 Documented in Chart    GLUCOSE, CAPILLARY     Status: Abnormal   Collection Time    06/11/14  9:09 PM      Result Value Ref Range   Glucose-Capillary 212 (*) 70 - 99 mg/dL   Comment 1 Notify RN     Comment 2 Documented in Chart    GLUCOSE, CAPILLARY     Status: Abnormal   Collection Time    06/11/14 11:43 PM      Result Value Ref Range   Glucose-Capillary 162 (*) 70 - 99 mg/dL   Comment 1 Notify RN     Comment  2 Documented in Chart    GLUCOSE, CAPILLARY     Status: Abnormal   Collection Time    06/12/14  3:25 AM      Result Value Ref Range   Glucose-Capillary 171 (*) 70 - 99 mg/dL  HEPATIC FUNCTION PANEL     Status: Abnormal   Collection Time    06/12/14  4:48 AM      Result Value Ref Range   Total Protein 6.0  6.0 - 8.3 g/dL   Albumin 2.8 (*) 3.5 - 5.2 g/dL   AST 89 (*) 0 - 37 U/L   ALT 661 (*) 0 - 53 U/L   Alkaline Phosphatase 46  39 - 117 U/L   Total Bilirubin 1.6 (*) 0.3 - 1.2 mg/dL   Bilirubin, Direct 0.5 (*) 0.0 - 0.3 mg/dL   Indirect Bilirubin 1.1 (*) 0.3 - 0.9 mg/dL  BASIC METABOLIC PANEL     Status: Abnormal   Collection Time    06/12/14  4:48 AM      Result Value Ref Range   Sodium 139  137 - 147 mEq/L   Potassium 3.9  3.7 -  5.3 mEq/L   Chloride 94 (*) 96 - 112 mEq/L   CO2 33 (*) 19 - 32 mEq/L   Glucose, Bld 167 (*) 70 - 99 mg/dL   BUN 31 (*) 6 - 23 mg/dL   Creatinine, Ser 1.30  0.50 - 1.35 mg/dL   Calcium 8.5  8.4 - 10.5 mg/dL   GFR calc non Af Amer 52 (*) >90 mL/min   GFR calc Af Amer 60 (*) >90 mL/min   Comment: (NOTE)     The eGFR has been calculated using the CKD EPI equation.     This calculation has not been validated in all clinical situations.     eGFR's persistently <90 mL/min signify possible Chronic Kidney     Disease.   Anion gap 12  5 - 15  GLUCOSE, CAPILLARY     Status: Abnormal   Collection Time    06/12/14  7:48 AM      Result Value Ref Range   Glucose-Capillary 152 (*) 70 - 99 mg/dL   Comment 1 Documented in Chart     Comment 2 Notify RN    GLUCOSE, CAPILLARY     Status: Abnormal   Collection Time    06/12/14 11:32 AM      Result Value Ref Range   Glucose-Capillary 252 (*) 70 - 99 mg/dL  GLUCOSE, CAPILLARY     Status: Abnormal   Collection Time    06/12/14  4:14 PM      Result Value Ref Range   Glucose-Capillary 220 (*) 70 - 99 mg/dL   Comment 1 Notify RN    GLUCOSE, CAPILLARY     Status: Abnormal   Collection Time    06/12/14  8:07 PM       Result Value Ref Range   Glucose-Capillary 261 (*) 70 - 99 mg/dL   Comment 1 Notify RN     Comment 2 Documented in Chart    GLUCOSE, CAPILLARY     Status: Abnormal   Collection Time    06/13/14 12:31 AM      Result Value Ref Range   Glucose-Capillary 197 (*) 70 - 99 mg/dL  GLUCOSE, CAPILLARY     Status: Abnormal   Collection Time    06/13/14  5:24 AM      Result Value Ref Range   Glucose-Capillary 200 (*) 70 - 99 mg/dL  HEPATIC FUNCTION PANEL     Status: Abnormal   Collection Time    06/13/14  8:00 AM      Result Value Ref Range   Total Protein 6.3  6.0 - 8.3 g/dL   Albumin 3.0 (*) 3.5 - 5.2 g/dL   AST 40 (*) 0 - 37 U/L   ALT 464 (*) 0 - 53 U/L   Alkaline Phosphatase 44  39 - 117 U/L   Total Bilirubin 1.7 (*) 0.3 - 1.2 mg/dL   Bilirubin, Direct 0.7 (*) 0.0 - 0.3 mg/dL   Indirect Bilirubin 1.0 (*) 0.3 - 0.9 mg/dL  BASIC METABOLIC PANEL     Status: Abnormal   Collection Time    06/13/14  8:00 AM      Result Value Ref Range   Sodium 139  137 - 147 mEq/L   Potassium 4.1  3.7 - 5.3 mEq/L   Chloride 92 (*) 96 - 112 mEq/L   CO2 33 (*) 19 - 32 mEq/L   Glucose, Bld 216 (*) 70 - 99 mg/dL   BUN 37 (*) 6 - 23 mg/dL  Creatinine, Ser 1.29  0.50 - 1.35 mg/dL   Calcium 8.8  8.4 - 10.5 mg/dL   GFR calc non Af Amer 53 (*) >90 mL/min   GFR calc Af Amer 61 (*) >90 mL/min   Comment: (NOTE)     The eGFR has been calculated using the CKD EPI equation.     This calculation has not been validated in all clinical situations.     eGFR's persistently <90 mL/min signify possible Chronic Kidney     Disease.   Anion gap 14  5 - 15  GLUCOSE, CAPILLARY     Status: Abnormal   Collection Time    06/13/14  8:06 AM      Result Value Ref Range   Glucose-Capillary 195 (*) 70 - 99 mg/dL   Comment 1 Notify RN      ABGS No results found for this basename: PHART, PCO2, PO2ART, TCO2, HCO3,  in the last 72 hours CULTURES Recent Results (from the past 240 hour(s))  CULTURE, BLOOD (ROUTINE X 2)      Status: None   Collection Time    06/07/14 10:45 AM      Result Value Ref Range Status   Specimen Description BLOOD RIGHT FOREARM DRAWN BY RN AMANDA   Final   Special Requests BOTTLES DRAWN AEROBIC ONLY 4CC   Final   Culture NO GROWTH 5 DAYS   Final   Report Status 06/12/2014 FINAL   Final  CULTURE, BLOOD (ROUTINE X 2)     Status: None   Collection Time    06/07/14 11:18 AM      Result Value Ref Range Status   Specimen Description BLOOD LEFT ARM   Final   Special Requests BOTTLES DRAWN AEROBIC AND ANAEROBIC 6CC   Final   Culture NO GROWTH 5 DAYS   Final   Report Status 06/12/2014 FINAL   Final  URINE CULTURE     Status: None   Collection Time    06/07/14 12:30 PM      Result Value Ref Range Status   Specimen Description URINE, CATHETERIZED   Final   Special Requests NONE   Final   Culture  Setup Time     Final   Value: 06/07/2014 20:28     Performed at [facility]     Final   Value: NO GROWTH     Performed at [facility]   Culture     Final   Value: NO GROWTH     Performed at [facility]   Report Status 06/08/2014 FINAL   Final  MRSA PCR SCREENING     Status: None   Collection Time    06/07/14  2:05 PM      Result Value Ref Range Status   MRSA by PCR NEGATIVE  NEGATIVE Final   Comment:            The GeneXpert MRSA Assay (FDA     approved for NASAL specimens     only), is one component of a     comprehensive MRSA colonization     surveillance program. It is not     intended to diagnose MRSA     infection nor to guide or     monitor treatment for     MRSA infections.  CULTURE, EXPECTORATED SPUTUM-ASSESSMENT     Status: None   Collection Time    06/08/14  2:00 PM      Result Value Ref  Range Status   Specimen Description SPUTUM EXPECTORATED   Final   Special Requests NONE   Final   Sputum evaluation     Final   Value: THIS SPECIMEN IS ACCEPTABLE. RESPIRATORY CULTURE REPORT TO FOLLOW.     Performed at [facility]    Report Status 06/09/2014 FINAL   Final  CULTURE, RESPIRATORY (NON-EXPECTORATED)     Status: None   Collection Time    06/08/14  2:00 PM      Result Value Ref Range Status   Specimen Description SPUTUM EXPECTORATED   Final   Special Requests NONE   Final   Gram Stain     Final   Value: MODERATE WBC PRESENT,BOTH PMN AND MONONUCLEAR     FEW SQUAMOUS EPITHELIAL CELLS PRESENT     RARE GRAM POSITIVE COCCI     IN PAIRS FEW YEAST     Performed at [facility]   Culture     Final   Value: MODERATE CANDIDA ALBICANS     Performed at [facility]   Report Status 06/11/2014 FINAL   Final   Studies/Results: Dg Chest Port 1 View  06/12/2014   CLINICAL DATA:  Followup chest radiograph.  Pneumonia.  EXAM: PORTABLE CHEST - 1 VIEW  COMPARISON:  06/11/2014.  FINDINGS: Bilateral irregular interstitial thickening with heterogeneous areas of airspace opacity are stable. Stable cardiomegaly. No pneumothorax.  IMPRESSION: No change from the previous day's study.   Electronically Signed   By: Lajean Manes M.D.   On: 06/12/2014 07:38    Medications:  Prior to Admission:  Prescriptions prior to admission  Medication Sig Dispense Refill  . apixaban (ELIQUIS) 5 MG TABS tablet Take 5 mg by mouth 2 (two) times daily.      Marland Kitchen aspirin EC 81 MG tablet Take 81 mg by mouth daily.      Marland Kitchen atorvastatin (LIPITOR) 20 MG tablet Take 10 mg by mouth daily.      Marland Kitchen BEE POLLEN PO Take 1 capsule by mouth daily.      . furosemide (LASIX) 20 MG tablet Take 20 mg by mouth every Monday, Wednesday, and Friday.      . insulin detemir (LEVEMIR) 100 UNIT/ML injection Inject 0.2 mLs (20 Units total) into the skin daily.  10 mL  11  . latanoprost (XALATAN) 0.005 % ophthalmic solution Place 1 drop into the left eye at bedtime.      . magnesium oxide (MAG-OX) 400 MG tablet Take 400 mg by mouth daily at 6 PM.      . metFORMIN (GLUCOPHAGE) 500 MG tablet Take 1 tablet (500 mg total) by mouth 2 (two) times daily with a meal.  60  tablet  1  . metoprolol tartrate (LOPRESSOR) 25 MG tablet Take 25 mg by mouth 2 (two) times daily.      . Nintedanib Esylate (OFEV) 150 MG CAPS Take 1 capsule by mouth every 12 (twelve) hours.      . tadalafil (CIALIS) 20 MG tablet Take 20 mg by mouth daily as needed for erectile dysfunction.       Scheduled: . antiseptic oral rinse  7 mL Mouth Rinse q12n4p  . chlorhexidine  15 mL Mouth Rinse BID  . furosemide  40 mg Oral Daily  . insulin aspart  0-9 Units Subcutaneous 6 times per day  . latanoprost  1 drop Left Eye QHS  . levalbuterol  0.63 mg Nebulization TID  . methylPREDNISolone (SOLU-MEDROL) injection  40 mg Intravenous Q6H  .  metoprolol tartrate  12.5 mg Oral BID  . pantoprazole  40 mg Oral QAC supper  . piperacillin-tazobactam (ZOSYN)  IV  3.375 g Intravenous Q8H  . vancomycin  1,500 mg Intravenous Q24H   Continuous:  YEL:[id number], acetaminophen, albuterol, alum & mag hydroxide-simeth, HYDROcodone-acetaminophen, LORazepam, ondansetron (ZOFRAN) IV, ondansetron, senna-docusate, traZODone  Assesment: He was admitted with pneumonia and sepsis. He had renal and liver problems associated with that all of this getting better. At baseline he has pulmonary fibrosis. He says he feels much better today. Principal Problem:   Sepsis Active Problems:   Hypertension   Atrial fibrillation   Acute on chronic diastolic heart failure   Pulmonary fibrosis   Acute renal failure   DM type 2 (diabetes mellitus, type 2)   HCAP (healthcare-associated pneumonia)   Acute respiratory failure with hypoxia   Hyperkalemia   Elevated transaminase level   Gastric distention    Plan: Continue current treatments. He will need BiPAP at home. He's going to be sent to the deep coronary fibrosis clinic as an outpatient    LOS: 6 days   Nechuma Boven L 06/13/2014, 10:09 AM

## 2014-06-14 LAB — PROTIME-INR
INR: 1.5 — AB (ref 0.00–1.49)
Prothrombin Time: 18.1 seconds — ABNORMAL HIGH (ref 11.6–15.2)

## 2014-06-14 LAB — BASIC METABOLIC PANEL
Anion gap: 11 (ref 5–15)
BUN: 42 mg/dL — AB (ref 6–23)
CALCIUM: 8.8 mg/dL (ref 8.4–10.5)
CO2: 33 meq/L — AB (ref 19–32)
Chloride: 96 mEq/L (ref 96–112)
Creatinine, Ser: 1.15 mg/dL (ref 0.50–1.35)
GFR calc Af Amer: 70 mL/min — ABNORMAL LOW (ref >90)
GFR, EST NON AFRICAN AMERICAN: 60 mL/min — AB (ref >90)
Glucose, Bld: 229 mg/dL — ABNORMAL HIGH (ref 70–99)
Potassium: 4.3 mEq/L (ref 3.7–5.3)
Sodium: 140 mEq/L (ref 137–147)

## 2014-06-14 LAB — HEPATIC FUNCTION PANEL
ALK PHOS: 41 U/L (ref 39–117)
ALT: 341 U/L — AB (ref 0–53)
AST: 33 U/L (ref 0–37)
Albumin: 3 g/dL — ABNORMAL LOW (ref 3.5–5.2)
BILIRUBIN DIRECT: 0.6 mg/dL — AB (ref 0.0–0.3)
BILIRUBIN INDIRECT: 0.9 mg/dL (ref 0.3–0.9)
BILIRUBIN TOTAL: 1.5 mg/dL — AB (ref 0.3–1.2)
TOTAL PROTEIN: 6 g/dL (ref 6.0–8.3)

## 2014-06-14 LAB — GLUCOSE, CAPILLARY
GLUCOSE-CAPILLARY: 206 mg/dL — AB (ref 70–99)
GLUCOSE-CAPILLARY: 265 mg/dL — AB (ref 70–99)
GLUCOSE-CAPILLARY: 305 mg/dL — AB (ref 70–99)

## 2014-06-14 MED ORDER — SODIUM CHLORIDE 0.9 % IV SOLN
INTRAVENOUS | Status: DC
  Administered 2014-06-14: 17:00:00 via INTRAVENOUS

## 2014-06-14 NOTE — Progress Notes (Signed)
[facility] PROGRESS NOTE  [name] Sherman ZOX:[id number] DOB: 05/15/1941 DOA: 06/07/2014 PCP: [name], PA-C    Code Status: Full code Family Communication: Discussed with patient. No family at the bedside. Disposition Plan: To be determined.   Consultants:  Pulmonologist, Dr. Juanetta Gosling  Curbside consultation with intensivist, Sandrea Hughs M.D.  Gastroenterology  Procedures:  BiPAP  Antibiotics:  Vancomycin 06/07/14>> 8/31  Zosyn 06/07/14>>8/31  HPI/Subjective: Feeling better. No new complaints  Objective: Filed Vitals:   06/14/14 1821  BP: 155/86  Pulse: 92  Temp: 98.3 F (36.8 C)  Resp: 20    Intake/Output Summary (Last 24 hours) at 06/14/14 2033 Last data filed at 06/14/14 1926  Gross per 24 hour  Intake 1044.33 ml  Output   1300 ml  Net -255.67 ml   Filed Weights   06/10/14 0500 06/11/14 0500 06/12/14 0430  Weight: 72.576 kg (160 lb) 72.6 kg (160 lb 0.9 oz) 73.1 kg (161 lb 2.5 oz)    Exam:   General:  Pleasant, alert 73 year old African-American man in no acute distress,  Cardiovascular: Irregular, irregular. Trace pedal edema bilaterally  Respiratory: Crackles at right base. Breathing mildly labored at rest.  Abdomen: Positive bowel sounds, soft, no appreciable distention or rigidity; nontender.  Musculoskeletal: No acute hot red joints. Pedal pulses palpable.  Neurologic: He is alert and oriented x3. His speech is clear.   Data Reviewed: Basic Metabolic Panel:  Recent Labs Lab 06/10/14 0448 06/11/14 0454 06/12/14 0448 06/13/14 0800 06/14/14 0555  NA 141 141 139 139 140  K 4.5 4.5 3.9 4.1 4.3  CL 100 97 94* 92* 96  CO2 29 33* 33* 33* 33*  GLUCOSE 183* 174* 167* 216* 229*  BUN 27* 24* 31* 37* 42*  CREATININE 1.45* 1.33 1.30 1.29 1.15  CALCIUM 8.5 8.5 8.5 8.8 8.8   Liver Function Tests:  Recent Labs Lab 06/10/14 0448 06/11/14 0454 06/12/14 0448 06/13/14 0800 06/14/14 0555  AST 468* 230* 89* 40* 33  ALT  1124* 941* 661* 464* 341*  ALKPHOS 49 50 46 44 41  BILITOT 2.3* 1.9* 1.6* 1.7* 1.5*  PROT 6.1 6.2 6.0 6.3 6.0  ALBUMIN 2.8* 2.9* 2.8* 3.0* 3.0*   No results found for this basename: LIPASE, AMYLASE,  in the last 168 hours No results found for this basename: AMMONIA,  in the last 168 hours CBC:  Recent Labs Lab 06/08/14 0503 06/09/14 0434 06/10/14 0448 06/11/14 0454  WBC 8.4 9.5 11.2* 9.1  HGB 14.0 13.9 14.0 13.9  HCT 43.4 42.6 43.6 42.5  MCV 106.1* 103.6* 104.1* 104.2*  PLT 144* 156 173 163   Cardiac Enzymes: No results found for this basename: CKTOTAL, CKMB, CKMBINDEX, TROPONINI,  in the last 168 hours BNP (last 3 results)  Recent Labs  04/30/14 1115 06/07/14 1008 06/11/14 0800  PROBNP 1929.0* 10442.0* 2818.0*   CBG:  Recent Labs Lab 06/13/14 1628 06/13/14 2044 06/14/14 0743 06/14/14 1153 06/14/14 1632  GLUCAP 224* 279* 206* 265* 305*    Recent Results (from the past 240 hour(s))  CULTURE, BLOOD (ROUTINE X 2)     Status: None   Collection Time    06/07/14 10:45 AM      Result Value Ref Range Status   Specimen Description BLOOD RIGHT FOREARM DRAWN BY RN [facility]   Final   Special Requests BOTTLES DRAWN AEROBIC ONLY 4CC   Final   Culture NO GROWTH 5 DAYS   Final   Report Status 06/12/2014 FINAL   Final  CULTURE, BLOOD (ROUTINE  X 2)     Status: None   Collection Time    06/07/14 11:18 AM      Result Value Ref Range Status   Specimen Description BLOOD LEFT ARM   Final   Special Requests BOTTLES DRAWN AEROBIC AND ANAEROBIC 6CC   Final   Culture NO GROWTH 5 DAYS   Final   Report Status 06/12/2014 FINAL   Final  URINE CULTURE     Status: None   Collection Time    06/07/14 12:30 PM      Result Value Ref Range Status   Specimen Description URINE, CATHETERIZED   Final   Special Requests NONE   Final   Culture  Setup Time     Final   Value: 06/07/2014 20:28     Performed at [facility]     Final   Value: NO GROWTH     Performed at  [facility]   Culture     Final   Value: NO GROWTH     Performed at [facility]   Report Status 06/08/2014 FINAL   Final  MRSA PCR SCREENING     Status: None   Collection Time    06/07/14  2:05 PM      Result Value Ref Range Status   MRSA by PCR NEGATIVE  NEGATIVE Final   Comment:            The GeneXpert MRSA Assay (FDA     approved for NASAL specimens     only), is one component of a     comprehensive MRSA colonization     surveillance program. It is not     intended to diagnose MRSA     infection nor to guide or     monitor treatment for     MRSA infections.  CULTURE, EXPECTORATED SPUTUM-ASSESSMENT     Status: None   Collection Time    06/08/14  2:00 PM      Result Value Ref Range Status   Specimen Description SPUTUM EXPECTORATED   Final   Special Requests NONE   Final   Sputum evaluation     Final   Value: THIS SPECIMEN IS ACCEPTABLE. RESPIRATORY CULTURE REPORT TO FOLLOW.     Performed at [facility]   Report Status 06/09/2014 FINAL   Final  CULTURE, RESPIRATORY (NON-EXPECTORATED)     Status: None   Collection Time    06/08/14  2:00 PM      Result Value Ref Range Status   Specimen Description SPUTUM EXPECTORATED   Final   Special Requests NONE   Final   Gram Stain     Final   Value: MODERATE WBC PRESENT,BOTH PMN AND MONONUCLEAR     FEW SQUAMOUS EPITHELIAL CELLS PRESENT     RARE GRAM POSITIVE COCCI     IN PAIRS FEW YEAST     Performed at [facility]   Culture     Final   Value: MODERATE CANDIDA ALBICANS     Performed at [facility]   Report Status 06/11/2014 FINAL   Final     Studies: No results found.  Scheduled Meds: . antiseptic oral rinse  7 mL Mouth Rinse q12n4p  . chlorhexidine  15 mL Mouth Rinse BID  . furosemide  40 mg Oral Daily  . insulin aspart  0-9 Units Subcutaneous 6 times per day  . latanoprost  1 drop Left Eye QHS  . levalbuterol  0.63 mg Nebulization TID  . methylPREDNISolone (SOLU-MEDROL)  injection  40 mg Intravenous Q6H  . metoprolol tartrate  12.5 mg Oral BID  . pantoprazole  40 mg Oral QAC supper  . piperacillin-tazobactam (ZOSYN)  IV  3.375 g Intravenous Q8H  . vancomycin  1,500 mg Intravenous Q24H   Continuous Infusions: . sodium chloride 20 mL/hr at 06/14/14 1639   Assessment and plan:  Principal Problem:   Sepsis Active Problems:   Hypertension   Atrial fibrillation   Acute on chronic diastolic heart failure   Pulmonary fibrosis   Acute renal failure   DM type 2 (diabetes mellitus, type 2)   HCAP (healthcare-associated pneumonia)   Acute respiratory failure with hypoxia   Hyperkalemia   Elevated transaminase level   Gastric distention    1. Sepsis with lactic acidosis, presumed to be secondary to healthcare acquired pneumonia and/or aspiration pneumonia. Blood pressure has remained stable and he has not required pressors. His white blood cell count has improved. He was intermittently hypothermic and mildly febrile, but his temperature is within normal limits. Blood/urine cultures have not shown any growth. Sputum culture does not show any significant growth. We'll plan on discontinuing antibiotics after today's dose 2. Possible superimposed pneumonitis versus aspiration pneumonitis.  We'll plan on discontinuing antibiotics after today's dose. Also on IV Pepcid. 3. Severe pulmonary fibrosis. He is followed by Dr. Juanetta Gosling. Dr. Juanetta Gosling plans to refer the patient to the pulmonary fibrosis clinic at [facility]. He was recently started on a new medication for pulmonary fibrosis, OFEV. The patient had not been feeling well since starting this medication. It has been held. He is currently on IV steroids 4. Acute on Chronic diastolic heart failure with possible acute diastolic failure. Patient had elevated BNP and chest x-ray indicated underlying pulmonary edema. Clinically he had evidence of peripheral edema. He was diuresed with intravenous lasix. Echocardiogram shows  preserved ejection fraction. Now on oral Lasix. -Acute respiratory failure with hypoxia. Etiology multifactorial including HC AP/possible aspiration pneumonia/pneumonitis/severe pulmonary edema/and chronic heart failure. Patient has required bipap therapy qhs.  Will continue to wean off oxygen as tolerated. Currently on nasal cannula this morning. Per pulmonology, patient will likely need BiPAP each bedtime on discharge 5. Query gastric outlet obstruction/marked gaseous distention of the stomach on CT. He's not having any abdominal pain, nausea, vomiting. Seen by gastroenterology and plans are for endoscopy in am 6. Elevated LFTs, hepatitis. His AST and ALT  increased into the thousands. Etiology unclear. Viral markers were unremarkable an abdominal ultrasound did not have any significant findings. He has known fatty liver disease. Statin has been discontinued. Gastroenterology is following. May also be related to ischemia in setting of sepsis. Liver enzymes are trending down. Continue to follow 7. Acute renal failure. This is likely secondary to ATN and/or prerenal azotemia from his illness. He did have signs of volume overload and has diuresed well with lasix. Renal function has improved with lasix. Now on by mouth Lasix. Continue to follow. 8. Chronic atrial fibrillation His rate is currently controlled. Will continue low dosing of metoprolol. Anticoagulation currently on hold in anticipation of EGD. Aspirin discontinued. 9. Diabetes mellitus, type II; hypoglycemic on admission. Status post D50 in the ED. His blood glucose is better. He is on sliding scale NovoLog with the recent start of IV steroids.Maryclare Labrador continue to monitor.  Time spent:    MEMON,JEHANZEB  [facility] Pager [phone]. If 7PM-7AM, please contact night-coverage at [email or web address], password [facility] 06/14/2014, 8:33 PM  LOS:  7 days

## 2014-06-14 NOTE — Progress Notes (Signed)
REVIEWED-NO ADDITIONAL RECOMMENDATIONS. 

## 2014-06-14 NOTE — Progress Notes (Signed)
Inpatient Diabetes Program Recommendations  AACE/ADA: New Consensus Statement on Inpatient Glycemic Control (2013)  Target Ranges:  Prepandial:   less than 140 mg/dL      Peak postprandial:   less than 180 mg/dL (1-2 hours)      Critically ill patients:  140 - 180 mg/dL   Results for [name] Sherman, [name] Sherman (MRN [id number]) as of 06/14/2014 10:11  Ref. Range 06/13/2014 08:06 06/13/2014 11:48 06/13/2014 16:28 06/13/2014 20:44 06/14/2014 07:43  Glucose-Capillary Latest Range: 70-99 mg/dL 782 (H) 956 (H) 213 (H) 279 (H) 206 (H)   Diabetes history: DM2 Outpatient Diabetes medications: Levemir 20 units daily, Metformin 500 mg BID  Current orders for Inpatient glycemic control: Novolog 0-9 units Q4H  Inpatient Diabetes Program Recommendations Insulin - Basal: Please consider ordering Levemir 10 units QHS (1/2 of home dose). Insulin - Meal Coverage: If steroids are continued, please consider ordering Novolog 3 units TID with meals for meal coverage.  Note: Continues to receive Solumedrol 40 mg Q6H and CBGs ranged from 195-293 mg/dl on 0/86 and fasting glucose is 206 mg/dl.   Thanks, Orlando Penner, RN, MSN, CCRN Diabetes Coordinator Inpatient Diabetes Program [phone] (Team Pager) [phone] ([facility] office) [phone] [facility] office)

## 2014-06-14 NOTE — Progress Notes (Signed)
    Subjective: No abdominal pain, N/V. Respiratory status improved, on 4 liters nasal cannula. No respiratory concerns.   Objective: Vital signs in last 24 hours: Temp:  [97.6 F (36.4 C)-98.2 F (36.8 C)] 98 F (36.7 C) (08/31 0623) Pulse Rate:  [78-87] 78 (08/31 0623) Resp:  [16-20] 20 (08/31 0623) BP: (118-137)/(76-86) 137/86 mmHg (08/31 0623) SpO2:  [90 %-96 %] 92 % (08/31 0656) Last BM Date: 06/10/14 General:   Alert and oriented, pleasant Head:  Normocephalic and atraumatic. Abdomen:  Bowel sounds present, soft, non-tender, non-distended. No HSM or hernias noted. No rebound or guarding. No masses appreciated  Msk:  Symmetrical without gross deformities. Normal posture. Extremities:  Without edema. Neurologic:  Alert and  oriented x4 Psych:  Alert and cooperative. Normal mood and affect.  Intake/Output from previous day: 08/30 0701 - 08/31 0700 In: 720 [P.O.:720] Out: 1900 [Urine:1900] Intake/Output this shift:    BMET  Recent Labs  06/12/14 0448 06/13/14 0800 06/14/14 0555  NA 139 139 140  K 3.9 4.1 4.3  CL 94* 92* 96  CO2 33* 33* 33*  GLUCOSE 167* 216* 229*  BUN 31* 37* 42*  CREATININE 1.30 1.29 1.15  CALCIUM 8.5 8.8 8.8   LFT  Recent Labs  06/12/14 0448 06/13/14 0800 06/14/14 0555  PROT 6.0 6.3 6.0  ALBUMIN 2.8* 3.0* 3.0*  AST 89* 40* 33  ALT 661* 464* 341*  ALKPHOS 46 44 41  BILITOT 1.6* 1.7* 1.5*  BILIDIR 0.5* 0.7* 0.6*  IBILI 1.1* 1.0* 0.9   Lab Results  Component Value Date   INR 1.67* 06/10/2014   INR 2.56* 06/09/2014   INR 1.64* 04/30/2014     Assessment: 73 year old male admitted with sepsis secondary to healthcare acquired pneumonia with concern for aspiration pneumonia in the setting of severe pulmonary fibrosis; found to have marked gaseous distension of the stomach on CT but asymptomatic. Increased risk for aspiration. Recently on Ofev as outpatient for pulmonary fibrosis, which has several adverse GI side effects and can also  cause significant elevations in transaminases. Eliquis for history of afib: discontinued 8/25 with last dose 8/25 in the morning. Last INR 1.67.   Eliquis remains on hold. Respiratory status likely at baseline and doing well on nasal cannula at 4 liters. Remains asymptomatic from a GI standpoint. Will recheck INR today and proceed with EGD on 9/1 with Dr. Darrick Penna.   Elevated LFTs: transaminases bumped to thousands. Continued improvement. Viral markers negative and [place] of abdomen without acute findings. Known history of fatty liver. Query ischemic hepatitis as culprit. Continue to follow to baseline.   Plan: Resume diet today, NPO after midnight Eliquis on hold Check INR today Tentative EGD on 9/1 with Dr. Silvio Clayman, ANP-BC [facility]    LOS: 7 days    06/14/2014, 8:02 AM

## 2014-06-14 NOTE — Progress Notes (Signed)
Subjective: He says he feels better. He has no new complaints.  Objective: Vital signs in last 24 hours: Temp:  [97.6 F (36.4 C)-98.2 F (36.8 C)] 98 F (36.7 C) (08/31 0623) Pulse Rate:  [78-87] 78 (08/31 0623) Resp:  [16-20] 20 (08/31 0623) BP: (118-137)/(76-86) 137/86 mmHg (08/31 0623) SpO2:  [90 %-96 %] 92 % (08/31 0656) Weight change:  Last BM Date: 06/10/14  Intake/Output from previous day: 08/30 0701 - 08/31 0700 In: 720 [P.O.:720] Out: 1900 [Urine:1900]  PHYSICAL EXAM General appearance: alert, cooperative and mild distress Resp: His rhonchi are clearing and he has chronic bibasalar rales Cardio: He is in atrial fibrillation with well-controlled ventricular response GI: soft, non-tender; bowel sounds normal; no masses,  no organomegaly Extremities: extremities normal, atraumatic, no cyanosis or edema  Lab Results:  Results for orders placed during the hospital encounter of 06/07/14 (from the past 48 hour(s))  GLUCOSE, CAPILLARY     Status: Abnormal   Collection Time    06/12/14 11:32 AM      Result Value Ref Range   Glucose-Capillary 252 (*) 70 - 99 mg/dL  GLUCOSE, CAPILLARY     Status: Abnormal   Collection Time    06/12/14  4:14 PM      Result Value Ref Range   Glucose-Capillary 220 (*) 70 - 99 mg/dL   Comment 1 Notify RN    GLUCOSE, CAPILLARY     Status: Abnormal   Collection Time    06/12/14  8:07 PM      Result Value Ref Range   Glucose-Capillary 261 (*) 70 - 99 mg/dL   Comment 1 Notify RN     Comment 2 Documented in Chart    GLUCOSE, CAPILLARY     Status: Abnormal   Collection Time    06/13/14 12:31 AM      Result Value Ref Range   Glucose-Capillary 197 (*) 70 - 99 mg/dL  GLUCOSE, CAPILLARY     Status: Abnormal   Collection Time    06/13/14  5:24 AM      Result Value Ref Range   Glucose-Capillary 200 (*) 70 - 99 mg/dL  HEPATIC FUNCTION PANEL     Status: Abnormal   Collection Time    06/13/14  8:00 AM      Result Value Ref Range   Total  Protein 6.3  6.0 - 8.3 g/dL   Albumin 3.0 (*) 3.5 - 5.2 g/dL   AST 40 (*) 0 - 37 U/L   ALT 464 (*) 0 - 53 U/L   Alkaline Phosphatase 44  39 - 117 U/L   Total Bilirubin 1.7 (*) 0.3 - 1.2 mg/dL   Bilirubin, Direct 0.7 (*) 0.0 - 0.3 mg/dL   Indirect Bilirubin 1.0 (*) 0.3 - 0.9 mg/dL  BASIC METABOLIC PANEL     Status: Abnormal   Collection Time    06/13/14  8:00 AM      Result Value Ref Range   Sodium 139  137 - 147 mEq/L   Potassium 4.1  3.7 - 5.3 mEq/L   Chloride 92 (*) 96 - 112 mEq/L   CO2 33 (*) 19 - 32 mEq/L   Glucose, Bld 216 (*) 70 - 99 mg/dL   BUN 37 (*) 6 - 23 mg/dL   Creatinine, Ser 1.29  0.50 - 1.35 mg/dL   Calcium 8.8  8.4 - 10.5 mg/dL   GFR calc non Af Amer 53 (*) >90 mL/min   GFR calc Af Amer 61 (*) >90 mL/min  Comment: (NOTE)     The eGFR has been calculated using the CKD EPI equation.     This calculation has not been validated in all clinical situations.     eGFR's persistently <90 mL/min signify possible Chronic Kidney     Disease.   Anion gap 14  5 - 15  GLUCOSE, CAPILLARY     Status: Abnormal   Collection Time    06/13/14  8:06 AM      Result Value Ref Range   Glucose-Capillary 195 (*) 70 - 99 mg/dL   Comment 1 Notify RN    GLUCOSE, CAPILLARY     Status: Abnormal   Collection Time    06/13/14 11:48 AM      Result Value Ref Range   Glucose-Capillary 293 (*) 70 - 99 mg/dL   Comment 1 Notify RN    GLUCOSE, CAPILLARY     Status: Abnormal   Collection Time    06/13/14  4:28 PM      Result Value Ref Range   Glucose-Capillary 224 (*) 70 - 99 mg/dL   Comment 1 Notify RN    GLUCOSE, CAPILLARY     Status: Abnormal   Collection Time    06/13/14  8:44 PM      Result Value Ref Range   Glucose-Capillary 279 (*) 70 - 99 mg/dL   Comment 1 Notify RN     Comment 2 Documented in Chart    BASIC METABOLIC PANEL     Status: Abnormal   Collection Time    06/14/14  5:55 AM      Result Value Ref Range   Sodium 140  137 - 147 mEq/L   Potassium 4.3  3.7 - 5.3 mEq/L    Chloride 96  96 - 112 mEq/L   CO2 33 (*) 19 - 32 mEq/L   Glucose, Bld 229 (*) 70 - 99 mg/dL   BUN 42 (*) 6 - 23 mg/dL   Creatinine, Ser 1.15  0.50 - 1.35 mg/dL   Calcium 8.8  8.4 - 10.5 mg/dL   GFR calc non Af Amer 60 (*) >90 mL/min   GFR calc Af Amer 70 (*) >90 mL/min   Comment: (NOTE)     The eGFR has been calculated using the CKD EPI equation.     This calculation has not been validated in all clinical situations.     eGFR's persistently <90 mL/min signify possible Chronic Kidney     Disease.   Anion gap 11  5 - 15  HEPATIC FUNCTION PANEL     Status: Abnormal   Collection Time    06/14/14  5:55 AM      Result Value Ref Range   Total Protein 6.0  6.0 - 8.3 g/dL   Albumin 3.0 (*) 3.5 - 5.2 g/dL   AST 33  0 - 37 U/L   ALT 341 (*) 0 - 53 U/L   Alkaline Phosphatase 41  39 - 117 U/L   Total Bilirubin 1.5 (*) 0.3 - 1.2 mg/dL   Bilirubin, Direct 0.6 (*) 0.0 - 0.3 mg/dL   Indirect Bilirubin 0.9  0.3 - 0.9 mg/dL  GLUCOSE, CAPILLARY     Status: Abnormal   Collection Time    06/14/14  7:43 AM      Result Value Ref Range   Glucose-Capillary 206 (*) 70 - 99 mg/dL   Comment 1 Notify RN      ABGS No results found for this basename: PHART, PCO2, PO2ART, TCO2, HCO3,  in the last 72 hours CULTURES Recent Results (from the past 240 hour(s))  CULTURE, BLOOD (ROUTINE X 2)     Status: None   Collection Time    06/07/14 10:45 AM      Result Value Ref Range Status   Specimen Description BLOOD RIGHT FOREARM DRAWN BY RN AMANDA   Final   Special Requests BOTTLES DRAWN AEROBIC ONLY 4CC   Final   Culture NO GROWTH 5 DAYS   Final   Report Status 06/12/2014 FINAL   Final  CULTURE, BLOOD (ROUTINE X 2)     Status: None   Collection Time    06/07/14 11:18 AM      Result Value Ref Range Status   Specimen Description BLOOD LEFT ARM   Final   Special Requests BOTTLES DRAWN AEROBIC AND ANAEROBIC 6CC   Final   Culture NO GROWTH 5 DAYS   Final   Report Status 06/12/2014 FINAL   Final  URINE CULTURE      Status: None   Collection Time    06/07/14 12:30 PM      Result Value Ref Range Status   Specimen Description URINE, CATHETERIZED   Final   Special Requests NONE   Final   Culture  Setup Time     Final   Value: 06/07/2014 20:28     Performed at [facility]     Final   Value: NO GROWTH     Performed at [facility]   Culture     Final   Value: NO GROWTH     Performed at [facility]   Report Status 06/08/2014 FINAL   Final  MRSA PCR SCREENING     Status: None   Collection Time    06/07/14  2:05 PM      Result Value Ref Range Status   MRSA by PCR NEGATIVE  NEGATIVE Final   Comment:            The GeneXpert MRSA Assay (FDA     approved for NASAL specimens     only), is one component of a     comprehensive MRSA colonization     surveillance program. It is not     intended to diagnose MRSA     infection nor to guide or     monitor treatment for     MRSA infections.  CULTURE, EXPECTORATED SPUTUM-ASSESSMENT     Status: None   Collection Time    06/08/14  2:00 PM      Result Value Ref Range Status   Specimen Description SPUTUM EXPECTORATED   Final   Special Requests NONE   Final   Sputum evaluation     Final   Value: THIS SPECIMEN IS ACCEPTABLE. RESPIRATORY CULTURE REPORT TO FOLLOW.     Performed at [facility]   Report Status 06/09/2014 FINAL   Final  CULTURE, RESPIRATORY (NON-EXPECTORATED)     Status: None   Collection Time    06/08/14  2:00 PM      Result Value Ref Range Status   Specimen Description SPUTUM EXPECTORATED   Final   Special Requests NONE   Final   Gram Stain     Final   Value: MODERATE WBC PRESENT,BOTH PMN AND MONONUCLEAR     FEW SQUAMOUS EPITHELIAL CELLS PRESENT     RARE GRAM POSITIVE COCCI     IN PAIRS FEW YEAST     Performed at [facility]  Culture     Final   Value: MODERATE CANDIDA ALBICANS     Performed at [facility]   Report Status 06/11/2014 FINAL   Final    Studies/Results: No results found.  Medications:  Prior to Admission:  Prescriptions prior to admission  Medication Sig Dispense Refill  . apixaban (ELIQUIS) 5 MG TABS tablet Take 5 mg by mouth 2 (two) times daily.      Marland Kitchen aspirin EC 81 MG tablet Take 81 mg by mouth daily.      Marland Kitchen atorvastatin (LIPITOR) 20 MG tablet Take 10 mg by mouth daily.      Marland Kitchen BEE POLLEN PO Take 1 capsule by mouth daily.      . furosemide (LASIX) 20 MG tablet Take 20 mg by mouth every Monday, Wednesday, and Friday.      . insulin detemir (LEVEMIR) 100 UNIT/ML injection Inject 0.2 mLs (20 Units total) into the skin daily.  10 mL  11  . latanoprost (XALATAN) 0.005 % ophthalmic solution Place 1 drop into the left eye at bedtime.      . magnesium oxide (MAG-OX) 400 MG tablet Take 400 mg by mouth daily at 6 PM.      . metFORMIN (GLUCOPHAGE) 500 MG tablet Take 1 tablet (500 mg total) by mouth 2 (two) times daily with a meal.  60 tablet  1  . metoprolol tartrate (LOPRESSOR) 25 MG tablet Take 25 mg by mouth 2 (two) times daily.      . Nintedanib Esylate (OFEV) 150 MG CAPS Take 1 capsule by mouth every 12 (twelve) hours.      . tadalafil (CIALIS) 20 MG tablet Take 20 mg by mouth daily as needed for erectile dysfunction.       Scheduled: . antiseptic oral rinse  7 mL Mouth Rinse q12n4p  . chlorhexidine  15 mL Mouth Rinse BID  . furosemide  40 mg Oral Daily  . insulin aspart  0-9 Units Subcutaneous 6 times per day  . latanoprost  1 drop Left Eye QHS  . levalbuterol  0.63 mg Nebulization TID  . methylPREDNISolone (SOLU-MEDROL) injection  40 mg Intravenous Q6H  . metoprolol tartrate  12.5 mg Oral BID  . pantoprazole  40 mg Oral QAC supper  . piperacillin-tazobactam (ZOSYN)  IV  3.375 g Intravenous Q8H  . vancomycin  1,500 mg Intravenous Q24H   Continuous:  JSR:[id number], acetaminophen, albuterol, alum & mag hydroxide-simeth, HYDROcodone-acetaminophen, LORazepam, ondansetron (ZOFRAN) IV, ondansetron, senna-docusate,  traZODone  Assesment: He was admitted with healthcare associated pneumonia, acute respiratory failure and sepsis. He also had acute on chronic diastolic heart failure. At baseline he has pulmonary fibrosis. He seems to be improving. I think he aspirated. Principal Problem:   Sepsis Active Problems:   Hypertension   Atrial fibrillation   Acute on chronic diastolic heart failure   Pulmonary fibrosis   Acute renal failure   DM type 2 (diabetes mellitus, type 2)   HCAP (healthcare-associated pneumonia)   Acute respiratory failure with hypoxia   Hyperkalemia   Elevated transaminase level   Gastric distention    Plan: He will continue treatments. He will need BiPAP at home he is being set up to see the [facility] pulmonary fibrosis clinic    LOS: 7 days   Gladys Deckard L 06/14/2014, 8:52 AM

## 2014-06-14 NOTE — Progress Notes (Signed)
Patient has done well today;remains on his 5lpm [place] at rest.; continuing to monitor status for and distress or changed in O2 saturation.

## 2014-06-15 ENCOUNTER — Encounter (HOSPITAL_COMMUNITY)
Admission: EM | Disposition: A | Discharge status: Other Acute Inpatient Hospital | Payer: Self-pay | Source: Home / Self Care | Attending: Internal Medicine

## 2014-06-15 ENCOUNTER — Encounter (HOSPITAL_COMMUNITY): Payer: Self-pay | Admitting: *Deleted

## 2014-06-15 DIAGNOSIS — K2970 Gastritis, unspecified, without bleeding: Secondary | ICD-10-CM

## 2014-06-15 DIAGNOSIS — K2990 Gastroduodenitis, unspecified, without bleeding: Secondary | ICD-10-CM

## 2014-06-15 HISTORY — PX: ESOPHAGOGASTRODUODENOSCOPY: SHX5428

## 2014-06-15 LAB — BASIC METABOLIC PANEL
Anion gap: 10 (ref 5–15)
BUN: 45 mg/dL — AB (ref 6–23)
CALCIUM: 9.1 mg/dL (ref 8.4–10.5)
CO2: 34 mEq/L — ABNORMAL HIGH (ref 19–32)
CREATININE: 1.25 mg/dL (ref 0.50–1.35)
Chloride: 96 mEq/L (ref 96–112)
GFR calc non Af Amer: 55 mL/min — ABNORMAL LOW (ref >90)
GFR, EST AFRICAN AMERICAN: 63 mL/min — AB (ref >90)
Glucose, Bld: 175 mg/dL — ABNORMAL HIGH (ref 70–99)
Potassium: 4.2 mEq/L (ref 3.7–5.3)
Sodium: 140 mEq/L (ref 137–147)

## 2014-06-15 LAB — KOH PREP

## 2014-06-15 LAB — GLUCOSE, CAPILLARY
GLUCOSE-CAPILLARY: 164 mg/dL — AB (ref 70–99)
Glucose-Capillary: 164 mg/dL — ABNORMAL HIGH (ref 70–99)
Glucose-Capillary: 269 mg/dL — ABNORMAL HIGH (ref 70–99)
Glucose-Capillary: 314 mg/dL — ABNORMAL HIGH (ref 70–99)
Glucose-Capillary: 159 mg/dL — ABNORMAL HIGH (ref 70–99)
Glucose-Capillary: 179 mg/dL — ABNORMAL HIGH (ref 70–99)
Glucose-Capillary: 170 mg/dL — ABNORMAL HIGH (ref 70–99)
Glucose-Capillary: 291 mg/dL — ABNORMAL HIGH (ref 70–99)

## 2014-06-15 LAB — HEPATIC FUNCTION PANEL
ALT: 294 U/L — ABNORMAL HIGH (ref 0–53)
AST: 51 U/L — ABNORMAL HIGH (ref 0–37)
Albumin: 3.1 g/dL — ABNORMAL LOW (ref 3.5–5.2)
Alkaline Phosphatase: 49 U/L (ref 39–117)
BILIRUBIN INDIRECT: 0.9 mg/dL (ref 0.3–0.9)
BILIRUBIN TOTAL: 1.4 mg/dL — AB (ref 0.3–1.2)
Bilirubin, Direct: 0.5 mg/dL — ABNORMAL HIGH (ref 0.0–0.3)
Total Protein: 6.4 g/dL (ref 6.0–8.3)

## 2014-06-15 LAB — GAMMA GT: GGT: 129 U/L — AB (ref 7–51)

## 2014-06-15 SURGERY — EGD (ESOPHAGOGASTRODUODENOSCOPY)
Anesthesia: Moderate Sedation

## 2014-06-15 MED ORDER — MIDAZOLAM HCL 5 MG/5ML IJ SOLN
INTRAMUSCULAR | Status: DC | PRN
  Administered 2014-06-15 (×2): 1 mg via INTRAVENOUS
  Administered 2014-06-15: 2 mg via INTRAVENOUS

## 2014-06-15 MED ORDER — PANTOPRAZOLE SODIUM 40 MG PO TBEC
40.0000 mg | DELAYED_RELEASE_TABLET | Freq: Two times a day (BID) | ORAL | Status: DC
  Administered 2014-06-15 – 2014-06-17 (×4): 40 mg via ORAL
  Filled 2014-06-15 (×4): qty 1

## 2014-06-15 MED ORDER — LIDOCAINE VISCOUS 2 % MT SOLN
OROMUCOSAL | Status: AC
  Filled 2014-06-15: qty 15

## 2014-06-15 MED ORDER — FLUCONAZOLE 100 MG PO TABS
100.0000 mg | ORAL_TABLET | Freq: Every day | ORAL | Status: DC
  Administered 2014-06-16 – 2014-06-17 (×2): 100 mg via ORAL
  Filled 2014-06-15 (×2): qty 1

## 2014-06-15 MED ORDER — FLUMAZENIL 0.5 MG/5ML IV SOLN
INTRAVENOUS | Status: DC | PRN
  Administered 2014-06-15: .5 mg via INTRAVENOUS

## 2014-06-15 MED ORDER — FLUMAZENIL 0.5 MG/5ML IV SOLN
INTRAVENOUS | Status: AC
  Filled 2014-06-15: qty 5

## 2014-06-15 MED ORDER — PRO-STAT SUGAR FREE PO LIQD
30.0000 mL | Freq: Two times a day (BID) | ORAL | Status: DC
  Administered 2014-06-16 – 2014-06-17 (×4): 30 mL via ORAL
  Filled 2014-06-15 (×5): qty 30

## 2014-06-15 MED ORDER — MIDAZOLAM HCL 5 MG/5ML IJ SOLN
INTRAMUSCULAR | Status: AC
Start: 2014-06-15 — End: 2014-06-16
  Filled 2014-06-15: qty 10

## 2014-06-15 MED ORDER — MEPERIDINE HCL 100 MG/ML IJ SOLN
INTRAMUSCULAR | Status: DC | PRN
  Administered 2014-06-15 (×2): 25 mg via INTRAVENOUS

## 2014-06-15 MED ORDER — FLUCONAZOLE 100 MG PO TABS
200.0000 mg | ORAL_TABLET | Freq: Once | ORAL | Status: AC
  Administered 2014-06-15: 200 mg via ORAL
  Filled 2014-06-15: qty 2

## 2014-06-15 MED ORDER — MEPERIDINE HCL 100 MG/ML IJ SOLN
INTRAMUSCULAR | Status: AC
  Filled 2014-06-15: qty 2

## 2014-06-15 MED ORDER — STERILE WATER FOR IRRIGATION IR SOLN
Status: DC | PRN
  Administered 2014-06-15: 15:00:00

## 2014-06-15 NOTE — Progress Notes (Signed)
Reported to Dr. Darrick Penna that patient's sats were in the low 80's with nasal canula at 6L O2. Respiratory reported that they had been in low 80's today. Dr. Darrick Penna stated that he could wear his CPAP upstairs and to let his primary doctor know of his sats. Merri Brunette to notify his primary. No other orders at this time.

## 2014-06-15 NOTE — H&P (Signed)
Primary Care Physician:  Lucius Conn Primary Gastroenterologist:  Dr. Darrick Penna  Pre-Procedure History & Physical: HPI:  [name] Sherman is a 73 y.o. male here for DISTENDED STOMACH ON CT.  Past Medical History  Diagnosis Date  . Essential hypertension, benign   . Type 2 diabetes mellitus   . Atrial fibrillation     Dr. [name] [facility]  . Mixed hyperlipidemia   . Pulmonary fibrosis     dx 11/2013  . Pneumonia     11/2013  . Chronic diastolic heart failure   . On home O2     2L N/C prn  . COPD (chronic obstructive pulmonary disease)   . CHF (congestive heart failure)     Pt. states Dr. Juanetta Gosling stated he has CHF.    Past Surgical History  Procedure Laterality Date  . Hernia repair    . Eye surgery      Prior to Admission medications   Medication Sig Start Date End Date Taking? Authorizing Provider  apixaban (ELIQUIS) 5 MG TABS tablet Take 5 mg by mouth 2 (two) times daily.   Yes Historical Provider, MD  aspirin EC 81 MG tablet Take 81 mg by mouth daily.   Yes Historical Provider, MD  atorvastatin (LIPITOR) 20 MG tablet Take 10 mg by mouth daily.   Yes Historical Provider, MD  BEE POLLEN PO Take 1 capsule by mouth daily.   Yes Historical Provider, MD  furosemide (LASIX) 20 MG tablet Take 20 mg by mouth every Monday, Wednesday, and Friday.   Yes Historical Provider, MD  insulin detemir (LEVEMIR) 100 UNIT/ML injection Inject 0.2 mLs (20 Units total) into the skin daily. 05/03/14  Yes Erick Blinks, MD  latanoprost (XALATAN) 0.005 % ophthalmic solution Place 1 drop into the left eye at bedtime.   Yes Historical Provider, MD  magnesium oxide (MAG-OX) 400 MG tablet Take 400 mg by mouth daily at 6 PM.   Yes Historical Provider, MD  metFORMIN (GLUCOPHAGE) 500 MG tablet Take 1 tablet (500 mg total) by mouth 2 (two) times daily with a meal. 05/03/14  Yes Erick Blinks, MD  metoprolol tartrate (LOPRESSOR) 25 MG tablet Take 25 mg by mouth 2 (two) times daily.   Yes Historical  Provider, MD  Nintedanib Esylate (OFEV) 150 MG CAPS Take 1 capsule by mouth every 12 (twelve) hours.   Yes Historical Provider, MD  tadalafil (CIALIS) 20 MG tablet Take 20 mg by mouth daily as needed for erectile dysfunction.    Historical Provider, MD    Allergies as of 06/07/2014  . (No Known Allergies)    Family History  Problem Relation Age of Onset  . Diabetes Mellitus II Mother   . Heart Problems Mother   . Diabetes Mellitus II Sister   . Colon cancer Neg Hx   . Cancer - Prostate Father   . Cancer - Prostate Brother     History   Social History  . Marital Status: Married    Spouse Name: N/A    Number of Children: N/A  . Years of Education: N/A   Occupational History  . Not on file.   Social History Main Topics  . Smoking status: Former Smoker -- 1.00 packs/day for 20 years    Types: Cigarettes    Quit date: 10/15/1987  . Smokeless tobacco: Not on file  . Alcohol Use: Yes     Comment: Occasional  . Drug Use: No  . Sexual Activity: No   Other Topics Concern  .  Not on file   Social History Narrative  . No narrative on file    Review of Systems: See HPI, otherwise negative ROS   Physical Exam: BP 137/81  Pulse 69  Temp(Src) 97.5 F (36.4 C) (Oral)  Resp 20  Ht  (1.803 m)  Wt 161 lb 2.5 oz (73.1 kg)  BMI 22.49 kg/m2  SpO2 92% General:   Alert,  pleasant and cooperative in NAD Head:  Normocephalic and atraumatic. Neck:  Supple; Lungs:  Clear throughout to auscultation.    Heart:  Regular rate and rhythm. Abdomen:  Soft, nontender and nondistended. Normal bowel sounds, without guarding, and without rebound.   Neurologic:  Alert and  oriented x4;  grossly normal neurologically.  Impression/Plan:   .distended stomach on CT  PLAN: 1. EGD WITH PYLORUS DILATION

## 2014-06-15 NOTE — Progress Notes (Signed)
[facility] PROGRESS NOTE  [name] Sherman ZOX:[id number] DOB: Nov 13, 1940 DOA: 06/07/2014 PCP: [name], PA-C   Summary:  This patient was admitted to the hospital with progressive shortness of breath. He has known pulmonary fibrosis and appeared to have developed pneumonia.  He was admitted to step down unit and required bipap therapy. He was started on steroids, antibiotics and bronchodilators. He slowly started to improve. He was found to have pulmonary edema after IV hydration and was started on IV lasix with good effect. He has since been transitioned to oral lasix.  He has also undergone EGD for evaluation of possible gastric outlet obstruction with results as below. He has been slow to improve, but can hopefully be discharged home in the next few days.  Pulmonology has recommended bipap qhs on discharge and this has been arranged by care management. Will request physical therapy evaluation.  Code Status: Full code Family Communication: Discussed with patient. No family at the bedside. Disposition Plan: To be determined.   Consultants:  Pulmonologist, Dr. Juanetta Gosling  Curbside consultation with intensivist, Sandrea Hughs M.D.  Gastroenterology  Procedures:  BiPAP Endoscopy: 1. PROBABLE CANDIDA ESOPHAGITIS  2. MODERATE Non-erosive gastritis AND MILD DUODENITIS  3. PYLORIC STENOSIS    Antibiotics:  Vancomycin 06/07/14>> 8/31  Zosyn 06/07/14>>8/31  HPI/Subjective: Patient seen in his room after endoscopy.  He says his breathing is improving.  Continues to have productive cough  Objective: Filed Vitals:   06/15/14 2200  BP:   Pulse: 75  Temp:   Resp: 17    Intake/Output Summary (Last 24 hours) at 06/15/14 2329 Last data filed at 06/15/14 1800  Gross per 24 hour  Intake    460 ml  Output    725 ml  Net   -265 ml   Filed Weights   06/10/14 0500 06/11/14 0500 06/12/14 0430  Weight: 72.576 kg (160 lb) 72.6 kg (160 lb 0.9 oz) 73.1 kg (161 lb 2.5 oz)     Exam:   General:  Pleasant, alert 73 year old African-American man in no acute distress,  Cardiovascular: Irregular, irregular. Trace pedal edema bilaterally  Respiratory: Crackles at right base.   Abdomen: Positive bowel sounds, soft, no appreciable distention or rigidity; nontender.  Musculoskeletal: No acute hot red joints. Pedal pulses palpable.  Neurologic: He is alert and oriented x3. His speech is clear.   Data Reviewed: Basic Metabolic Panel:  Recent Labs Lab 06/11/14 0454 06/12/14 0448 06/13/14 0800 06/14/14 0555 06/15/14 0553  NA 141 139 139 140 140  K 4.5 3.9 4.1 4.3 4.2  CL 97 94* 92* 96 96  CO2 33* 33* 33* 33* 34*  GLUCOSE 174* 167* 216* 229* 175*  BUN 24* 31* 37* 42* 45*  CREATININE 1.33 1.30 1.29 1.15 1.25  CALCIUM 8.5 8.5 8.8 8.8 9.1   Liver Function Tests:  Recent Labs Lab 06/11/14 0454 06/12/14 0448 06/13/14 0800 06/14/14 0555 06/15/14 0553  AST 230* 89* 40* 33 51*  ALT 941* 661* 464* 341* 294*  ALKPHOS 50 46 44 41 49  BILITOT 1.9* 1.6* 1.7* 1.5* 1.4*  PROT 6.2 6.0 6.3 6.0 6.4  ALBUMIN 2.9* 2.8* 3.0* 3.0* 3.1*   No results found for this basename: LIPASE, AMYLASE,  in the last 168 hours No results found for this basename: AMMONIA,  in the last 168 hours CBC:  Recent Labs Lab 06/09/14 0434 06/10/14 0448 06/11/14 0454  WBC 9.5 11.2* 9.1  HGB 13.9 14.0 13.9  HCT 42.6 43.6 42.5  MCV 103.6* 104.1* 104.2*  PLT 156 173 163   Cardiac Enzymes: No results found for this basename: CKTOTAL, CKMB, CKMBINDEX, TROPONINI,  in the last 168 hours BNP (last 3 results)  Recent Labs  04/30/14 1115 06/07/14 1008 06/11/14 0800  PROBNP 1929.0* 10442.0* 2818.0*   CBG:  Recent Labs Lab 06/15/14 0738 06/15/14 1129 06/15/14 1432 06/15/14 1729 06/15/14 2031  GLUCAP 159* 164* 179* 170* 291*    Recent Results (from the past 240 hour(s))  CULTURE, BLOOD (ROUTINE X 2)     Status: None   Collection Time    06/07/14 10:45 AM      Result  Value Ref Range Status   Specimen Description BLOOD RIGHT FOREARM DRAWN BY RN AMANDA   Final   Special Requests BOTTLES DRAWN AEROBIC ONLY 4CC   Final   Culture NO GROWTH 5 DAYS   Final   Report Status 06/12/2014 FINAL   Final  CULTURE, BLOOD (ROUTINE X 2)     Status: None   Collection Time    06/07/14 11:18 AM      Result Value Ref Range Status   Specimen Description BLOOD LEFT ARM   Final   Special Requests BOTTLES DRAWN AEROBIC AND ANAEROBIC 6CC   Final   Culture NO GROWTH 5 DAYS   Final   Report Status 06/12/2014 FINAL   Final  URINE CULTURE     Status: None   Collection Time    06/07/14 12:30 PM      Result Value Ref Range Status   Specimen Description URINE, CATHETERIZED   Final   Special Requests NONE   Final   Culture  Setup Time     Final   Value: 06/07/2014 20:28     Performed at [facility] Count     Final   Value: NO GROWTH     Performed at [facility]   Culture     Final   Value: NO GROWTH     Performed at [facility]   Report Status 06/08/2014 FINAL   Final  MRSA PCR SCREENING     Status: None   Collection Time    06/07/14  2:05 PM      Result Value Ref Range Status   MRSA by PCR NEGATIVE  NEGATIVE Final   Comment:            The GeneXpert MRSA Assay (FDA     approved for NASAL specimens     only), is one component of a     comprehensive MRSA colonization     surveillance program. It is not     intended to diagnose MRSA     infection nor to guide or     monitor treatment for     MRSA infections.  CULTURE, EXPECTORATED SPUTUM-ASSESSMENT     Status: None   Collection Time    06/08/14  2:00 PM      Result Value Ref Range Status   Specimen Description SPUTUM EXPECTORATED   Final   Special Requests NONE   Final   Sputum evaluation     Final   Value: THIS SPECIMEN IS ACCEPTABLE. RESPIRATORY CULTURE REPORT TO FOLLOW.     Performed at [facility]   Report Status 06/09/2014 FINAL   Final  CULTURE, RESPIRATORY  (NON-EXPECTORATED)     Status: None   Collection Time    06/08/14  2:00 PM      Result Value Ref Range Status   Specimen Description  SPUTUM EXPECTORATED   Final   Special Requests NONE   Final   Gram Stain     Final   Value: MODERATE WBC PRESENT,BOTH PMN AND MONONUCLEAR     FEW SQUAMOUS EPITHELIAL CELLS PRESENT     RARE GRAM POSITIVE COCCI     IN PAIRS FEW YEAST     Performed at [facility]   Culture     Final   Value: MODERATE CANDIDA ALBICANS     Performed at [facility]   Report Status 06/11/2014 FINAL   Final  KOH PREP     Status: None   Collection Time    06/15/14  3:30 PM      Result Value Ref Range Status   Specimen Description ESOPHAGUS BRUSHING   Final   Special Requests NONE   Final   KOH Prep     Final   Value: YEAST WITH PSEUDOHYPHAE     Performed at [facility]   Report Status 06/15/2014 FINAL   Final     Studies: No results found.  Scheduled Meds: . antiseptic oral rinse  7 mL Mouth Rinse q12n4p  . chlorhexidine  15 mL Mouth Rinse BID  . feeding supplement (PRO-STAT SUGAR FREE 64)  30 mL Oral BID BM  . [START ON 06/16/2014] fluconazole  100 mg Oral Daily  . flumazenil      . furosemide  40 mg Oral Daily  . insulin aspart  0-9 Units Subcutaneous 6 times per day  . latanoprost  1 drop Left Eye QHS  . levalbuterol  0.63 mg Nebulization TID  . lidocaine      . meperidine      . methylPREDNISolone (SOLU-MEDROL) injection  40 mg Intravenous Q6H  . metoprolol tartrate  12.5 mg Oral BID  . midazolam      . pantoprazole  40 mg Oral BID AC   Continuous Infusions:   Assessment and plan:  Principal Problem:   Sepsis Active Problems:   Hypertension   Atrial fibrillation   Acute on chronic diastolic heart failure   Pulmonary fibrosis   Acute renal failure   DM type 2 (diabetes mellitus, type 2)   HCAP (healthcare-associated pneumonia)   Acute respiratory failure with hypoxia   Hyperkalemia   Elevated transaminase level   Gastric  distention    1. Sepsis with lactic acidosis, presumed to be secondary to healthcare acquired pneumonia and/or aspiration pneumonia. Blood pressure has remained stable and he has not required pressors. His white blood cell count has improved. He was intermittently hypothermic and mildly febrile, but now his temperature is within normal limits. Blood/urine cultures have not shown any growth. Sputum culture does not show any significant growth. We'll plan on discontinuing antibiotics after today's dose 2. Possible superimposed pneumonitis versus aspiration pneumonitis.  He has completed a course of antibiotics. Also on IV Pepcid. 3. Severe pulmonary fibrosis. He is followed by Dr. Juanetta Gosling. Dr. Juanetta Gosling plans to refer the patient to the pulmonary fibrosis clinic at [facility]. He was recently started on a new medication for pulmonary fibrosis, OFEV. The patient had not been feeling well since starting this medication. It has been held. He is currently on IV steroids and he will likely need to be on prednisone on discharge 4. Acute on Chronic diastolic heart failure with possible acute diastolic failure. Patient had elevated BNP and chest x-ray indicated underlying pulmonary edema. Clinically he had evidence of peripheral edema. He was diuresed with intravenous lasix. Echocardiogram shows  preserved ejection fraction. Now on oral Lasix. -Acute respiratory failure with hypoxia. Etiology multifactorial including HC AP/possible aspiration pneumonia/pneumonitis/severe pulmonary edema/and chronic heart failure. Patient has required bipap therapy qhs.  Will continue to wean off oxygen as tolerated. Currently on nasal cannula this morning. Per pulmonology, patient will likely need BiPAP each bedtime on discharge 5. Query gastric outlet obstruction/marked gaseous distention of the stomach on CT. Patient was seen by gastroenterology and underwent EGD on 9/1. Results indicated probable candida esophagitis, gastritis and  pyloric stenosis. Stenosis was dilated with balloon. 6. Elevated LFTs, hepatitis. His AST and ALT  increased into the thousands. Etiology unclear. Viral markers were unremarkable an abdominal ultrasound did not have any significant findings. He has known fatty liver disease. Statin was discontinued. Gastroenterology is following. Likely related to ischemia in setting of sepsis. Liver enzymes are trending down. Continue to follow 7. Acute renal failure. This was likely secondary to ATN and/or prerenal azotemia from his illness.The patient was hydrated with IV fluids and developed signs of volume overload. He was then diuresed well with lasix. Renal function has improved with lasix. Now on by mouth Lasix. Continue to follow. 8. Chronic atrial fibrillation His rate is currently controlled. Will continue low dosing of metoprolol. Anticoagulation currently on hold in anticipation of EGD. Aspirin discontinued. 9. Diabetes mellitus, type II; hypoglycemic on admission. Status post D50 in the ED. His blood glucose is better. He is on sliding scale NovoLog since being on IV steroids.Maryclare Labrador continue to monitor.  Time spent:    MEMON,JEHANZEB  [facility] Pager [phone]. If 7PM-7AM, please contact night-coverage at [email or web address], password [facility] 06/15/2014, 11:29 PM  LOS: 8 days

## 2014-06-15 NOTE — Progress Notes (Signed)
Inpatient Diabetes Program Recommendations  AACE/ADA: New Consensus Statement on Inpatient Glycemic Control (2013)  Target Ranges:  Prepandial:   less than 140 mg/dL      Peak postprandial:   less than 180 mg/dL (1-2 hours)      Critically ill patients:  140 - 180 mg/dL   Results for [name] Sherman, [name] Sherman (MRN [id number]) as of 06/15/2014 10:38  Ref. Range 06/14/2014 07:43 06/14/2014 11:53 06/14/2014 16:32 06/14/2014 20:00 06/14/2014 23:51 06/15/2014 04:15 06/15/2014 07:38  Glucose-Capillary Latest Range: 70-99 mg/dL 409 (H) 811 (H) 914 (H) 314 (H) 269 (H) 164 (H) 159 (H)   Diabetes history: DM2  Outpatient Diabetes medications: Levemir 20 units daily, Metformin 500 mg BID  Current orders for Inpatient glycemic control: Novolog 0-9 units Q4H  Inpatient Diabetes Program Recommendations Insulin - Basal: Patient's CBGs ranged from 206-314 mg/dl on 7/82 and he received a total of Novolog 25 units for correction on 8/31. Please consider ordering low dose basal; recommend Levemir 7 units (based on 73 kg x 0.1 units). Insulin - Meal Coverage: If steroids are continued, please consider ordering Novolog 3 units TID with meals for meal coverage.  Thanks, Orlando Penner, RN, MSN, CCRN Diabetes Coordinator Inpatient Diabetes Program [phone] (Team Pager) [phone] (AP office) [phone] [facility] office)

## 2014-06-15 NOTE — Progress Notes (Signed)
Subjective: He is about the same. He has no new complaints. He is set for EGD today  Objective: Vital signs in last 24 hours: Temp:  [97.5 F (36.4 C)-98.3 F (36.8 C)] 97.5 F (36.4 C) (09/01 0420) Pulse Rate:  [79-97] 79 (09/01 0420) Resp:  [17-20] 20 (09/01 0420) BP: (134-155)/(86-90) 134/90 mmHg (09/01 0420) SpO2:  [80 %-100 %] 88 % (09/01 0719) Weight change:  Last BM Date: 06/13/14  Intake/Output from previous day: 08/31 0701 - 09/01 0700 In: 1044.3 [P.O.:480; I.V.:14.3; IV Piggyback:550] Out: 1000 [Urine:1000]  PHYSICAL EXAM General appearance: alert, cooperative and mild distress Resp: rhonchi bilaterally Cardio: He is in atrial fibrillation with well-controlled ventricular response GI: soft, non-tender; bowel sounds normal; no masses,  no organomegaly Extremities: Trace edema  Lab Results:  Results for orders placed during the hospital encounter of 06/07/14 (from the past 48 hour(s))  GLUCOSE, CAPILLARY     Status: Abnormal   Collection Time    06/13/14 11:48 AM      Result Value Ref Range   Glucose-Capillary 293 (*) 70 - 99 mg/dL   Comment 1 Notify RN    GLUCOSE, CAPILLARY     Status: Abnormal   Collection Time    06/13/14  4:28 PM      Result Value Ref Range   Glucose-Capillary 224 (*) 70 - 99 mg/dL   Comment 1 Notify RN    GLUCOSE, CAPILLARY     Status: Abnormal   Collection Time    06/13/14  8:44 PM      Result Value Ref Range   Glucose-Capillary 279 (*) 70 - 99 mg/dL   Comment 1 Notify RN     Comment 2 Documented in Chart    BASIC METABOLIC PANEL     Status: Abnormal   Collection Time    06/14/14  5:55 AM      Result Value Ref Range   Sodium 140  137 - 147 mEq/L   Potassium 4.3  3.7 - 5.3 mEq/L   Chloride 96  96 - 112 mEq/L   CO2 33 (*) 19 - 32 mEq/L   Glucose, Bld 229 (*) 70 - 99 mg/dL   BUN 42 (*) 6 - 23 mg/dL   Creatinine, Ser 1.15  0.50 - 1.35 mg/dL   Calcium 8.8  8.4 - 10.5 mg/dL   GFR calc non Af Amer 60 (*) >90 mL/min   GFR calc Af  Amer 70 (*) >90 mL/min   Comment: (NOTE)     The eGFR has been calculated using the CKD EPI equation.     This calculation has not been validated in all clinical situations.     eGFR's persistently <90 mL/min signify possible Chronic Kidney     Disease.   Anion gap 11  5 - 15  HEPATIC FUNCTION PANEL     Status: Abnormal   Collection Time    06/14/14  5:55 AM      Result Value Ref Range   Total Protein 6.0  6.0 - 8.3 g/dL   Albumin 3.0 (*) 3.5 - 5.2 g/dL   AST 33  0 - 37 U/L   ALT 341 (*) 0 - 53 U/L   Alkaline Phosphatase 41  39 - 117 U/L   Total Bilirubin 1.5 (*) 0.3 - 1.2 mg/dL   Bilirubin, Direct 0.6 (*) 0.0 - 0.3 mg/dL   Indirect Bilirubin 0.9  0.3 - 0.9 mg/dL  GLUCOSE, CAPILLARY     Status: Abnormal   Collection Time  06/14/14  7:43 AM      Result Value Ref Range   Glucose-Capillary 206 (*) 70 - 99 mg/dL   Comment 1 Notify RN    PROTIME-INR     Status: Abnormal   Collection Time    06/14/14 10:18 AM      Result Value Ref Range   Prothrombin Time 18.1 (*) 11.6 - 15.2 seconds   INR 1.50 (*) 0.00 - 1.49  GLUCOSE, CAPILLARY     Status: Abnormal   Collection Time    06/14/14 11:53 AM      Result Value Ref Range   Glucose-Capillary 265 (*) 70 - 99 mg/dL   Comment 1 Notify RN    GLUCOSE, CAPILLARY     Status: Abnormal   Collection Time    06/14/14  4:32 PM      Result Value Ref Range   Glucose-Capillary 305 (*) 70 - 99 mg/dL   Comment 1 Documented in Chart     Comment 2 Notify RN    GLUCOSE, CAPILLARY     Status: Abnormal   Collection Time    06/14/14  8:00 PM      Result Value Ref Range   Glucose-Capillary 314 (*) 70 - 99 mg/dL   Comment 1 Notify RN    GLUCOSE, CAPILLARY     Status: Abnormal   Collection Time    06/14/14 11:51 PM      Result Value Ref Range   Glucose-Capillary 269 (*) 70 - 99 mg/dL   Comment 1 Notify RN    GLUCOSE, CAPILLARY     Status: Abnormal   Collection Time    06/15/14  4:15 AM      Result Value Ref Range   Glucose-Capillary 164 (*)  70 - 99 mg/dL  HEPATIC FUNCTION PANEL     Status: Abnormal   Collection Time    06/15/14  5:53 AM      Result Value Ref Range   Total Protein 6.4  6.0 - 8.3 g/dL   Albumin 3.1 (*) 3.5 - 5.2 g/dL   AST 51 (*) 0 - 37 U/L   ALT 294 (*) 0 - 53 U/L   Alkaline Phosphatase 49  39 - 117 U/L   Total Bilirubin 1.4 (*) 0.3 - 1.2 mg/dL   Bilirubin, Direct 0.5 (*) 0.0 - 0.3 mg/dL   Indirect Bilirubin 0.9  0.3 - 0.9 mg/dL  BASIC METABOLIC PANEL     Status: Abnormal   Collection Time    06/15/14  5:53 AM      Result Value Ref Range   Sodium 140  137 - 147 mEq/L   Potassium 4.2  3.7 - 5.3 mEq/L   Chloride 96  96 - 112 mEq/L   CO2 34 (*) 19 - 32 mEq/L   Glucose, Bld 175 (*) 70 - 99 mg/dL   BUN 45 (*) 6 - 23 mg/dL   Creatinine, Ser 1.25  0.50 - 1.35 mg/dL   Calcium 9.1  8.4 - 10.5 mg/dL   GFR calc non Af Amer 55 (*) >90 mL/min   GFR calc Af Amer 63 (*) >90 mL/min   Comment: (NOTE)     The eGFR has been calculated using the CKD EPI equation.     This calculation has not been validated in all clinical situations.     eGFR's persistently <90 mL/min signify possible Chronic Kidney     Disease.   Anion gap 10  5 - 15  GLUCOSE, CAPILLARY  Status: Abnormal   Collection Time    06/15/14  7:38 AM      Result Value Ref Range   Glucose-Capillary 159 (*) 70 - 99 mg/dL    ABGS No results found for this basename: PHART, PCO2, PO2ART, TCO2, HCO3,  in the last 72 hours CULTURES Recent Results (from the past 240 hour(s))  CULTURE, BLOOD (ROUTINE X 2)     Status: None   Collection Time    06/07/14 10:45 AM      Result Value Ref Range Status   Specimen Description BLOOD RIGHT FOREARM DRAWN BY RN AMANDA   Final   Special Requests BOTTLES DRAWN AEROBIC ONLY 4CC   Final   Culture NO GROWTH 5 DAYS   Final   Report Status 06/12/2014 FINAL   Final  CULTURE, BLOOD (ROUTINE X 2)     Status: None   Collection Time    06/07/14 11:18 AM      Result Value Ref Range Status   Specimen Description BLOOD LEFT  ARM   Final   Special Requests BOTTLES DRAWN AEROBIC AND ANAEROBIC 6CC   Final   Culture NO GROWTH 5 DAYS   Final   Report Status 06/12/2014 FINAL   Final  URINE CULTURE     Status: None   Collection Time    06/07/14 12:30 PM      Result Value Ref Range Status   Specimen Description URINE, CATHETERIZED   Final   Special Requests NONE   Final   Culture  Setup Time     Final   Value: 06/07/2014 20:28     Performed at [place]     Final   Value: NO GROWTH     Performed at [facility]   Culture     Final   Value: NO GROWTH     Performed at [facility]   Report Status 06/08/2014 FINAL   Final  MRSA PCR SCREENING     Status: None   Collection Time    06/07/14  2:05 PM      Result Value Ref Range Status   MRSA by PCR NEGATIVE  NEGATIVE Final   Comment:            The GeneXpert MRSA Assay (FDA     approved for NASAL specimens     only), is one component of a     comprehensive MRSA colonization     surveillance program. It is not     intended to diagnose MRSA     infection nor to guide or     monitor treatment for     MRSA infections.  CULTURE, EXPECTORATED SPUTUM-ASSESSMENT     Status: None   Collection Time    06/08/14  2:00 PM      Result Value Ref Range Status   Specimen Description SPUTUM EXPECTORATED   Final   Special Requests NONE   Final   Sputum evaluation     Final   Value: THIS SPECIMEN IS ACCEPTABLE. RESPIRATORY CULTURE REPORT TO FOLLOW.     Performed at [facility]   Report Status 06/09/2014 FINAL   Final  CULTURE, RESPIRATORY (NON-EXPECTORATED)     Status: None   Collection Time    06/08/14  2:00 PM      Result Value Ref Range Status   Specimen Description SPUTUM EXPECTORATED   Final   Special Requests NONE   Final   Gram Stain  Final   Value: MODERATE WBC PRESENT,BOTH PMN AND MONONUCLEAR     FEW SQUAMOUS EPITHELIAL CELLS PRESENT     RARE GRAM POSITIVE COCCI     IN PAIRS FEW YEAST     Performed at  [facility]   Culture     Final   Value: MODERATE CANDIDA ALBICANS     Performed at [facility]   Report Status 06/11/2014 FINAL   Final   Studies/Results: No results found.  Medications:  Prior to Admission:  Prescriptions prior to admission  Medication Sig Dispense Refill  . apixaban (ELIQUIS) 5 MG TABS tablet Take 5 mg by mouth 2 (two) times daily.      Marland Kitchen aspirin EC 81 MG tablet Take 81 mg by mouth daily.      Marland Kitchen atorvastatin (LIPITOR) 20 MG tablet Take 10 mg by mouth daily.      Marland Kitchen BEE POLLEN PO Take 1 capsule by mouth daily.      . furosemide (LASIX) 20 MG tablet Take 20 mg by mouth every Monday, Wednesday, and Friday.      . insulin detemir (LEVEMIR) 100 UNIT/ML injection Inject 0.2 mLs (20 Units total) into the skin daily.  10 mL  11  . latanoprost (XALATAN) 0.005 % ophthalmic solution Place 1 drop into the left eye at bedtime.      . magnesium oxide (MAG-OX) 400 MG tablet Take 400 mg by mouth daily at 6 PM.      . metFORMIN (GLUCOPHAGE) 500 MG tablet Take 1 tablet (500 mg total) by mouth 2 (two) times daily with a meal.  60 tablet  1  . metoprolol tartrate (LOPRESSOR) 25 MG tablet Take 25 mg by mouth 2 (two) times daily.      . Nintedanib Esylate (OFEV) 150 MG CAPS Take 1 capsule by mouth every 12 (twelve) hours.      . tadalafil (CIALIS) 20 MG tablet Take 20 mg by mouth daily as needed for erectile dysfunction.       Scheduled: . antiseptic oral rinse  7 mL Mouth Rinse q12n4p  . chlorhexidine  15 mL Mouth Rinse BID  . feeding supplement (PRO-STAT SUGAR FREE 64)  30 mL Oral BID BM  . furosemide  40 mg Oral Daily  . insulin aspart  0-9 Units Subcutaneous 6 times per day  . latanoprost  1 drop Left Eye QHS  . levalbuterol  0.63 mg Nebulization TID  . methylPREDNISolone (SOLU-MEDROL) injection  40 mg Intravenous Q6H  . metoprolol tartrate  12.5 mg Oral BID  . pantoprazole  40 mg Oral QAC supper   Continuous: . sodium chloride 20 mL/hr at 06/14/14 1639    IOE:[id number], acetaminophen, albuterol, alum & mag hydroxide-simeth, HYDROcodone-acetaminophen, LORazepam, ondansetron (ZOFRAN) IV, ondansetron, senna-docusate, traZODone  Assesment: He was admitted with acute respiratory failure with pneumonia and sepsis. At baseline he has pulmonary fibrosis and probably some element of COPD. He had elevated liver enzymes and what appear to be acute kidney injury on admission. He also had gastric distention which is going to be investigated today with the EGD. He has improved as far as his pneumonia is concerned. Principal Problem:   Sepsis Active Problems:   Hypertension   Atrial fibrillation   Acute on chronic diastolic heart failure   Pulmonary fibrosis   Acute renal failure   DM type 2 (diabetes mellitus, type 2)   HCAP (healthcare-associated pneumonia)   Acute respiratory failure with hypoxia   Hyperkalemia   Elevated transaminase  level   Gastric distention    Plan: EGD today    LOS: 8 days   Holliday Sheaffer L 06/15/2014, 8:45 AM

## 2014-06-15 NOTE — Progress Notes (Signed)
Received report from [place], [place] in ENDO. Patient was transferred back to his room @ 1730. Patient was alert and oriented and had no complaints. Patients VS were within normal limits. MD was notified of patients oxygen level during EGD. Upon arrival to floor patients oxygen level was 92 on 6L. Will continue to monitor patient at this time.

## 2014-06-15 NOTE — Progress Notes (Signed)
INITIAL NUTRITION ASSESSMENT  DOCUMENTATION CODES Per approved criteria  -Not Applicable   INTERVENTION:  When diet is resumed: ProStat 30 ml BID (each 30 ml provides 100 kcal, 15 gr protein)   RD will continue to follow nutrition care  NUTRITION DIAGNOSIS: Increased nutrient needs related to sepsis, COPD,CHF as evidenced by estimated nutrition needs   Goal: Pt to meet >/= 90% of their estimated nutrition needs     Monitor: Po intake, labs and wt trends    Reason for Assessment: Length of stay   73 y.o. male  Admitting Dx: Sepsis  ASSESSMENT: Pt is 73 y.o. male a past medical history that includes atrial fibrillation, chronic diastolic heart failure, COPD on home oxygen at 2 L, diabetes present to the emergency department with the chief complaint of worsening shortness of breath. Initial evaluation in the emergency department he was found to be septic with elevated lactic acid, tachycardic, febrile, hypoxia, leukocytosis and chest x-ray concerning for pneumonia and pulmonary edema, acute renal failure and acute on chronic respiratory failure with hypoxia.   Pt NPO/Cl liquids 8/24-8/27. Diet adv Dys 2/CHO Modified 8/28. He reports appetite very good which is also reflected in 100% meal intake. He is currently NPO after midnight for procedure.   Pt reports good appetite. He says he likes the food here and has been eating "good". His wife is in a nursing home and his sisters come by and bring food for him. He usually eats 3 meals daily.  His weight hx is without significant change.  Labs: BUN trending up, CBG's-206-305.  Nutrition Focused Physical Exam:  Subcutaneous Fat:  Orbital Region: mild wasting Upper Arm Region: WDL Thoracic and Lumbar Region: WDL  Muscle:  Temple Region: mild wasting Clavicle Bone Region: moderate wasting Clavicle and Acromion Bone Region: mild wasting Scapular Bone Region: WDL Dorsal Hand: mild-moderate wasting Patellar Region: mild  wasting Anterior Thigh Region: moderate wasting Posterior Calf Region: WDL  Edema: none    Height: Ht Readings from Last 1 Encounters:  06/12/14  (1.803 m)    Weight: Wt Readings from Last 1 Encounters:  06/12/14 161 lb 2.5 oz (73.1 kg)    Ideal Body Weight: 172# (78 kg)  % Ideal Body Weight: 94%  Wt Readings from Last 10 Encounters:  06/12/14 161 lb 2.5 oz (73.1 kg)  06/12/14 161 lb 2.5 oz (73.1 kg)  05/08/14 162 lb 7.7 oz (73.7 kg)  05/03/14 167 lb 1.7 oz (75.8 kg)  04/01/14 170 lb (77.111 kg)  11/07/13 165 lb 11.2 oz (75.161 kg)    Usual Body Weight: 165#  % Usual Body Weight: 98%  BMI:  Body mass index is 22.49 kg/(m^2).normal range  Estimated Nutritional Needs: Kcal: 2190-2409 Protein: 102-110 gr Fluid: 1800 ml daily (normal needs)  Skin: intact  Diet Order: NPO  EDUCATION NEEDS: -No education needs identified at this time   Intake/Output Summary (Last 24 hours) at 06/15/14 0411 Last data filed at 06/14/14 2352  Gross per 24 hour  Intake 1044.33 ml  Output   1500 ml  Net -455.67 ml    Last BM: 8/30   Labs:   Recent Labs Lab 06/12/14 0448 06/13/14 0800 06/14/14 0555  NA 139 139 140  K 3.9 4.1 4.3  CL 94* 92* 96  CO2 33* 33* 33*  BUN 31* 37* 42*  CREATININE 1.30 1.29 1.15  CALCIUM 8.5 8.8 8.8  GLUCOSE 167* 216* 229*    CBG (last 3)   Recent Labs  06/14/14 0743  06/14/14 1153 06/14/14 1632  GLUCAP 206* 265* 305*    Scheduled Meds: . antiseptic oral rinse  7 mL Mouth Rinse q12n4p  . chlorhexidine  15 mL Mouth Rinse BID  . furosemide  40 mg Oral Daily  . insulin aspart  0-9 Units Subcutaneous 6 times per day  . latanoprost  1 drop Left Eye QHS  . levalbuterol  0.63 mg Nebulization TID  . methylPREDNISolone (SOLU-MEDROL) injection  40 mg Intravenous Q6H  . metoprolol tartrate  12.5 mg Oral BID  . pantoprazole  40 mg Oral QAC supper    Continuous Infusions: . sodium chloride 20 mL/hr at 06/14/14 1639    Past  Medical History  Diagnosis Date  . Essential hypertension, benign   . Type 2 diabetes mellitus   . Atrial fibrillation     Dr. [name]  . Mixed hyperlipidemia   . Pulmonary fibrosis     dx 11/2013  . Pneumonia     11/2013  . Chronic diastolic heart failure   . On home O2     2L N/C prn  . COPD (chronic obstructive pulmonary disease)     Past Surgical History  Procedure Laterality Date  . Hernia repair    . Eye surgery      Royann Shivers MS,RD,CSG,LDN Office: #[phone] Pager: [phone]

## 2014-06-15 NOTE — Progress Notes (Signed)
1510 Gave 100 mg of unopened Demerol to [employer], Phar Tech to be carried upstairs (4th floor.) Witnessed by Laruth Bouchard, Scrub Tech. Paper signed by RN and witnesses.

## 2014-06-15 NOTE — Op Note (Signed)
[facility] [address] [place], [address]   ENDOSCOPY PROCEDURE REPORT  PATIENT: [name] Sherman, [name] Sherman  MR#: [id number] BIRTHDATE: 04/27/1941 , 73  yrs. old GENDER: Male  ENDOSCOPIST: Jonette Eva, MD REFFERED [id number] [name], PA-C  PROCEDURE DATE:  06/15/2014 PROCEDURE:   EGD with biopsy and EGD with balloon dilatation  INDICATIONS:1.  DISTENDED STOMACH ON CT SUGGESTING GASTRIC OUTLET OBSTRUCTION ADMITTED WITH SEPSIS/PNA AND ON HIGH DOSE STEROIDS. MEDICATIONS: Demerol 50 mg IV and Versed 4 mg IV TOPICAL ANESTHETIC: Viscous Xylocaine  DESCRIPTION OF PROCEDURE:   After the risks benefits and alternatives of the procedure were thoroughly explained, informed consent was obtained.  The EG-[id number] ([id number])  endoscope was introduced through the mouth and advanced to the second portion of the duodenum. The instrument was slowly withdrawn as the mucosa was carefully examined.  Prior to withdrawal of the scope, the guidwire was placed.  The esophagus was dilated successfully.  The patient was recovered in endoscopy and discharged home in satisfactory condition.   ESOPHAGUS: MULTIPLE WHITE PLAQUES.  BRUSH BIOPSY OBTAINED. STOMACH: Moderate non-erosive gastritis (inflammation) was found in the entire examined stomach.  Multiple biopsies were performed using cold forceps.   DIFFICULT TO PASS ADULT GASTROSCOPE THROUGH PYLORUS.  PYLORUS BALLOON DILATED TO 15 MM.   DUODENUM: Mild duodenal inflammation was found in the duodenal bulb.   The duodenal mucosa showed no abnormalities in the 2nd part of the duodenum.   Dilation was then performed at the pylorus Dilator: Balloon Size(s): 12-15 MM EACH SETTING HELD FOR ONE MINUTE.COMPLICATIONS: There wAS HYPOXIA DUIRNG EGD. PT ON CPAP FOR PROCEDURE. ROMAZICON 0.5 MG IV GIIVEN TO IMPROVE RESPIRATORY EFFORT.DISCUSSED WITH DR. Kerry Hough. PT OBSERVED 1 HR IN ENDO. DISCHARGED TO FLOOR ON CPAP WITH O2 SAT 80-85%. Marland Kitchen  ENDOSCOPIC  IMPRESSION: 1.   PROBABLE CANDIDA ESOPHAGITIS 2.   MODERATE Non-erosive gastritis AND MILD DUODENITIS 3.   PYLORIC STENOSIS  RECOMMENDATIONS: AWAIT BIOPSY BID PPI LOW FAT DIET REDUCE STEROIDS WHEN CLINICALLY POSSIBLE      _______________________________ Rosalie DoctorJonette Eva, MD 06/15/2014 9:22 PM

## 2014-06-15 NOTE — Progress Notes (Signed)
Placed continuous pulse oximetry on for tonight. Pt has on CPAP from home. Bed is in lowest position & call bell is within reach. Will continue to monitor pt throughout night.

## 2014-06-16 LAB — HEPATIC FUNCTION PANEL
ALBUMIN: 3.5 g/dL (ref 3.5–5.2)
ALT: 254 U/L — AB (ref 0–53)
AST: 33 U/L (ref 0–37)
Alkaline Phosphatase: 55 U/L (ref 39–117)
BILIRUBIN INDIRECT: 1 mg/dL — AB (ref 0.3–0.9)
Bilirubin, Direct: 0.6 mg/dL — ABNORMAL HIGH (ref 0.0–0.3)
TOTAL PROTEIN: 7.2 g/dL (ref 6.0–8.3)
Total Bilirubin: 1.6 mg/dL — ABNORMAL HIGH (ref 0.3–1.2)

## 2014-06-16 LAB — GLUCOSE, CAPILLARY
GLUCOSE-CAPILLARY: 223 mg/dL — AB (ref 70–99)
GLUCOSE-CAPILLARY: 300 mg/dL — AB (ref 70–99)
GLUCOSE-CAPILLARY: 334 mg/dL — AB (ref 70–99)
Glucose-Capillary: 170 mg/dL — ABNORMAL HIGH (ref 70–99)
Glucose-Capillary: 123 mg/dL — ABNORMAL HIGH (ref 70–99)
Glucose-Capillary: 331 mg/dL — ABNORMAL HIGH (ref 70–99)

## 2014-06-16 LAB — BASIC METABOLIC PANEL
Anion gap: 12 (ref 5–15)
BUN: 42 mg/dL — AB (ref 6–23)
CALCIUM: 9.5 mg/dL (ref 8.4–10.5)
CO2: 35 meq/L — AB (ref 19–32)
Chloride: 97 mEq/L (ref 96–112)
Creatinine, Ser: 1.14 mg/dL (ref 0.50–1.35)
GFR calc Af Amer: 71 mL/min — ABNORMAL LOW (ref >90)
GFR calc non Af Amer: 61 mL/min — ABNORMAL LOW (ref >90)
GLUCOSE: 148 mg/dL — AB (ref 70–99)
Potassium: 5 mEq/L (ref 3.7–5.3)
SODIUM: 144 meq/L (ref 137–147)

## 2014-06-16 MED ORDER — NINTEDANIB ESYLATE 150 MG PO CAPS
1.0000 | ORAL_CAPSULE | Freq: Two times a day (BID) | ORAL | Status: DC
  Filled 2014-06-16 (×4): qty 1

## 2014-06-16 MED ORDER — MAGNESIUM OXIDE 400 (241.3 MG) MG PO TABS
400.0000 mg | ORAL_TABLET | Freq: Every day | ORAL | Status: DC
  Administered 2014-06-16: 400 mg via ORAL
  Filled 2014-06-16: qty 1

## 2014-06-16 MED ORDER — PREDNISONE 20 MG PO TABS
40.0000 mg | ORAL_TABLET | Freq: Every day | ORAL | Status: DC
  Administered 2014-06-17: 40 mg via ORAL
  Filled 2014-06-16: qty 2

## 2014-06-16 MED ORDER — ATORVASTATIN CALCIUM 10 MG PO TABS
10.0000 mg | ORAL_TABLET | Freq: Every evening | ORAL | Status: DC
  Administered 2014-06-16: 10 mg via ORAL
  Filled 2014-06-16: qty 1

## 2014-06-16 MED ORDER — APIXABAN 5 MG PO TABS
5.0000 mg | ORAL_TABLET | Freq: Two times a day (BID) | ORAL | Status: DC
  Administered 2014-06-16 – 2014-06-17 (×3): 5 mg via ORAL
  Filled 2014-06-16 (×3): qty 1

## 2014-06-16 NOTE — Progress Notes (Signed)
Subjective: He says he feels okay. He had EGD yesterday. He has no other new complaints.  Objective: Vital signs in last 24 hours: Temp:  [97 F (36.1 C)-97.7 F (36.5 C)] 97 F (36.1 C) (09/02 0458) Pulse Rate:  [62-86] 62 (09/02 0458) Resp:  [13-33] 14 (09/02 0458) BP: (127-185)/(72-112) 158/95 mmHg (09/02 0458) SpO2:  [67 %-100 %] 93 % (09/02 0736) Weight:  [73.936 kg (163 lb)] 73.936 kg (163 lb) (09/02 0500) Weight change:  Last BM Date: 06/15/14  Intake/Output from previous day: 09/01 0701 - 09/02 0700 In: 460 [P.O.:240; I.V.:220] Out: 1175 [Urine:1175]  PHYSICAL EXAM General appearance: alert, cooperative and no distress Resp: Rales in the bases bilaterally Cardio: Irregularly irregular GI: soft, non-tender; bowel sounds normal; no masses,  no organomegaly Extremities: Trace edema  Lab Results:  Results for orders placed during the hospital encounter of 06/07/14 (from the past 48 hour(s))  PROTIME-INR     Status: Abnormal   Collection Time    06/14/14 10:18 AM      Result Value Ref Range   Prothrombin Time 18.1 (*) 11.6 - 15.2 seconds   INR 1.50 (*) 0.00 - 1.49  GLUCOSE, CAPILLARY     Status: Abnormal   Collection Time    06/14/14 11:53 AM      Result Value Ref Range   Glucose-Capillary 265 (*) 70 - 99 mg/dL   Comment 1 Notify RN    GLUCOSE, CAPILLARY     Status: Abnormal   Collection Time    06/14/14  4:32 PM      Result Value Ref Range   Glucose-Capillary 305 (*) 70 - 99 mg/dL   Comment 1 Documented in Chart     Comment 2 Notify RN    GLUCOSE, CAPILLARY     Status: Abnormal   Collection Time    06/14/14  8:00 PM      Result Value Ref Range   Glucose-Capillary 314 (*) 70 - 99 mg/dL   Comment 1 Notify RN    GLUCOSE, CAPILLARY     Status: Abnormal   Collection Time    06/14/14 11:51 PM      Result Value Ref Range   Glucose-Capillary 269 (*) 70 - 99 mg/dL   Comment 1 Notify RN    GLUCOSE, CAPILLARY     Status: Abnormal   Collection Time    06/15/14   4:15 AM      Result Value Ref Range   Glucose-Capillary 164 (*) 70 - 99 mg/dL  GAMMA GT     Status: Abnormal   Collection Time    06/15/14  5:41 AM      Result Value Ref Range   GGT 129 (*) 7 - 51 U/L   Comment: Performed at [facility]  HEPATIC FUNCTION PANEL     Status: Abnormal   Collection Time    06/15/14  5:53 AM      Result Value Ref Range   Total Protein 6.4  6.0 - 8.3 g/dL   Albumin 3.1 (*) 3.5 - 5.2 g/dL   AST 51 (*) 0 - 37 U/L   ALT 294 (*) 0 - 53 U/L   Alkaline Phosphatase 49  39 - 117 U/L   Total Bilirubin 1.4 (*) 0.3 - 1.2 mg/dL   Bilirubin, Direct 0.5 (*) 0.0 - 0.3 mg/dL   Indirect Bilirubin 0.9  0.3 - 0.9 mg/dL  BASIC METABOLIC PANEL     Status: Abnormal   Collection Time  06/15/14  5:53 AM      Result Value Ref Range   Sodium 140  137 - 147 mEq/L   Potassium 4.2  3.7 - 5.3 mEq/L   Chloride 96  96 - 112 mEq/L   CO2 34 (*) 19 - 32 mEq/L   Glucose, Bld 175 (*) 70 - 99 mg/dL   BUN 45 (*) 6 - 23 mg/dL   Creatinine, Ser 1.25  0.50 - 1.35 mg/dL   Calcium 9.1  8.4 - 10.5 mg/dL   GFR calc non Af Amer 55 (*) >90 mL/min   GFR calc Af Amer 63 (*) >90 mL/min   Comment: (NOTE)     The eGFR has been calculated using the CKD EPI equation.     This calculation has not been validated in all clinical situations.     eGFR's persistently <90 mL/min signify possible Chronic Kidney     Disease.   Anion gap 10  5 - 15  GLUCOSE, CAPILLARY     Status: Abnormal   Collection Time    06/15/14  7:38 AM      Result Value Ref Range   Glucose-Capillary 159 (*) 70 - 99 mg/dL  GLUCOSE, CAPILLARY     Status: Abnormal   Collection Time    06/15/14 11:29 AM      Result Value Ref Range   Glucose-Capillary 164 (*) 70 - 99 mg/dL  GLUCOSE, CAPILLARY     Status: Abnormal   Collection Time    06/15/14  2:32 PM      Result Value Ref Range   Glucose-Capillary 179 (*) 70 - 99 mg/dL  KOH PREP     Status: None   Collection Time    06/15/14  3:30 PM      Result Value Ref Range    Specimen Description ESOPHAGUS BRUSHING     Special Requests NONE     KOH Prep       Value: YEAST WITH PSEUDOHYPHAE     Performed at [facility]   Report Status 06/15/2014 FINAL    GLUCOSE, CAPILLARY     Status: Abnormal   Collection Time    06/15/14  5:29 PM      Result Value Ref Range   Glucose-Capillary 170 (*) 70 - 99 mg/dL  GLUCOSE, CAPILLARY     Status: Abnormal   Collection Time    06/15/14  8:31 PM      Result Value Ref Range   Glucose-Capillary 291 (*) 70 - 99 mg/dL   Comment 1 Notify RN     Comment 2 Documented in Chart    GLUCOSE, CAPILLARY     Status: Abnormal   Collection Time    06/16/14 12:57 AM      Result Value Ref Range   Glucose-Capillary 223 (*) 70 - 99 mg/dL  GLUCOSE, CAPILLARY     Status: Abnormal   Collection Time    06/16/14  4:51 AM      Result Value Ref Range   Glucose-Capillary 170 (*) 70 - 99 mg/dL   Comment 1 Notify RN     Comment 2 Documented in Chart    BASIC METABOLIC PANEL     Status: Abnormal   Collection Time    06/16/14  6:50 AM      Result Value Ref Range   Sodium 144  137 - 147 mEq/L   Potassium 5.0  3.7 - 5.3 mEq/L   Chloride 97  96 - 112 mEq/L   CO2  35 (*) 19 - 32 mEq/L   Glucose, Bld 148 (*) 70 - 99 mg/dL   BUN 42 (*) 6 - 23 mg/dL   Creatinine, Ser 1.14  0.50 - 1.35 mg/dL   Calcium 9.5  8.4 - 10.5 mg/dL   GFR calc non Af Amer 61 (*) >90 mL/min   GFR calc Af Amer 71 (*) >90 mL/min   Comment: (NOTE)     The eGFR has been calculated using the CKD EPI equation.     This calculation has not been validated in all clinical situations.     eGFR's persistently <90 mL/min signify possible Chronic Kidney     Disease.   Anion gap 12  5 - 15  HEPATIC FUNCTION PANEL     Status: Abnormal   Collection Time    06/16/14  6:50 AM      Result Value Ref Range   Total Protein 7.2  6.0 - 8.3 g/dL   Albumin 3.5  3.5 - 5.2 g/dL   AST 33  0 - 37 U/L   ALT 254 (*) 0 - 53 U/L   Alkaline Phosphatase 55  39 - 117 U/L   Total Bilirubin 1.6  (*) 0.3 - 1.2 mg/dL   Bilirubin, Direct 0.6 (*) 0.0 - 0.3 mg/dL   Indirect Bilirubin 1.0 (*) 0.3 - 0.9 mg/dL    ABGS No results found for this basename: PHART, PCO2, PO2ART, TCO2, HCO3,  in the last 72 hours CULTURES Recent Results (from the past 240 hour(s))  CULTURE, BLOOD (ROUTINE X 2)     Status: None   Collection Time    06/07/14 10:45 AM      Result Value Ref Range Status   Specimen Description BLOOD RIGHT FOREARM DRAWN BY RN AMANDA   Final   Special Requests BOTTLES DRAWN AEROBIC ONLY 4CC   Final   Culture NO GROWTH 5 DAYS   Final   Report Status 06/12/2014 FINAL   Final  CULTURE, BLOOD (ROUTINE X 2)     Status: None   Collection Time    06/07/14 11:18 AM      Result Value Ref Range Status   Specimen Description BLOOD LEFT ARM   Final   Special Requests BOTTLES DRAWN AEROBIC AND ANAEROBIC [place]   Final   Culture NO GROWTH 5 DAYS   Final   Report Status 06/12/2014 FINAL   Final  URINE CULTURE     Status: None   Collection Time    06/07/14 12:30 PM      Result Value Ref Range Status   Specimen Description URINE, CATHETERIZED   Final   Special Requests NONE   Final   Culture  Setup Time     Final   Value: 06/07/2014 20:28     Performed at [facility]     Final   Value: NO GROWTH     Performed at [facility]   Culture     Final   Value: NO GROWTH     Performed at [facility]   Report Status 06/08/2014 FINAL   Final  MRSA PCR SCREENING     Status: None   Collection Time    06/07/14  2:05 PM      Result Value Ref Range Status   MRSA by PCR NEGATIVE  NEGATIVE Final   Comment:            The GeneXpert MRSA Assay (FDA     approved  for NASAL specimens     only), is one component of a     comprehensive MRSA colonization     surveillance program. It is not     intended to diagnose MRSA     infection nor to guide or     monitor treatment for     MRSA infections.  CULTURE, EXPECTORATED SPUTUM-ASSESSMENT     Status: None    Collection Time    06/08/14  2:00 PM      Result Value Ref Range Status   Specimen Description SPUTUM EXPECTORATED   Final   Special Requests NONE   Final   Sputum evaluation     Final   Value: THIS SPECIMEN IS ACCEPTABLE. RESPIRATORY CULTURE REPORT TO FOLLOW.     Performed at [facility]   Report Status 06/09/2014 FINAL   Final  CULTURE, RESPIRATORY (NON-EXPECTORATED)     Status: None   Collection Time    06/08/14  2:00 PM      Result Value Ref Range Status   Specimen Description SPUTUM EXPECTORATED   Final   Special Requests NONE   Final   Gram Stain     Final   Value: MODERATE WBC PRESENT,BOTH PMN AND MONONUCLEAR     FEW SQUAMOUS EPITHELIAL CELLS PRESENT     RARE GRAM POSITIVE COCCI     IN PAIRS FEW YEAST     Performed at [facility]   Culture     Final   Value: MODERATE CANDIDA ALBICANS     Performed at [facility]   Report Status 06/11/2014 FINAL   Final  KOH PREP     Status: None   Collection Time    06/15/14  3:30 PM      Result Value Ref Range Status   Specimen Description ESOPHAGUS BRUSHING   Final   Special Requests NONE   Final   KOH Prep     Final   Value: YEAST WITH PSEUDOHYPHAE     Performed at [facility]   Report Status 06/15/2014 FINAL   Final   Studies/Results: No results found.  Medications:  Prior to Admission:  Prescriptions prior to admission  Medication Sig Dispense Refill  . apixaban (ELIQUIS) 5 MG TABS tablet Take 5 mg by mouth 2 (two) times daily.      Marland Kitchen aspirin EC 81 MG tablet Take 81 mg by mouth daily.      Marland Kitchen atorvastatin (LIPITOR) 20 MG tablet Take 10 mg by mouth daily.      Marland Kitchen BEE POLLEN PO Take 1 capsule by mouth daily.      . furosemide (LASIX) 20 MG tablet Take 20 mg by mouth every Monday, Wednesday, and Friday.      . insulin detemir (LEVEMIR) 100 UNIT/ML injection Inject 0.2 mLs (20 Units total) into the skin daily.  10 mL  11  . latanoprost (XALATAN) 0.005 % ophthalmic solution Place 1 drop into the  left eye at bedtime.      . magnesium oxide (MAG-OX) 400 MG tablet Take 400 mg by mouth daily at 6 PM.      . metFORMIN (GLUCOPHAGE) 500 MG tablet Take 1 tablet (500 mg total) by mouth 2 (two) times daily with a meal.  60 tablet  1  . metoprolol tartrate (LOPRESSOR) 25 MG tablet Take 25 mg by mouth 2 (two) times daily.      . Nintedanib Esylate (OFEV) 150 MG CAPS Take 1 capsule by mouth every  12 (twelve) hours.      . tadalafil (CIALIS) 20 MG tablet Take 20 mg by mouth daily as needed for erectile dysfunction.       Scheduled: . antiseptic oral rinse  7 mL Mouth Rinse q12n4p  . chlorhexidine  15 mL Mouth Rinse BID  . feeding supplement (PRO-STAT SUGAR FREE 64)  30 mL Oral BID BM  . fluconazole  100 mg Oral Daily  . furosemide  40 mg Oral Daily  . insulin aspart  0-9 Units Subcutaneous 6 times per day  . latanoprost  1 drop Left Eye QHS  . levalbuterol  0.63 mg Nebulization TID  . methylPREDNISolone (SOLU-MEDROL) injection  40 mg Intravenous Q6H  . metoprolol tartrate  12.5 mg Oral BID  . pantoprazole  40 mg Oral BID AC   Continuous:  BVA:POLIDCVUDTHYH, acetaminophen, albuterol, alum & mag hydroxide-simeth, HYDROcodone-acetaminophen, LORazepam, ondansetron (ZOFRAN) IV, ondansetron, senna-docusate, traZODone  Assesment: He was admitted with healthcare associated pneumonia acute respiratory failure sepsis. He developed acute on chronic diastolic heart failure elevated transaminase levels and acute renal failure. This all seems to have improved. He had gastric distention and underwent EGD and dilatation yesterday. He may have Candida esophagitis. Principal Problem:   Sepsis Active Problems:   Hypertension   Atrial fibrillation   Acute on chronic diastolic heart failure   Pulmonary fibrosis   Acute renal failure   DM type 2 (diabetes mellitus, type 2)   HCAP (healthcare-associated pneumonia)   Acute respiratory failure with hypoxia   Hyperkalemia   Elevated transaminase level    Gastric distention    Plan: Decrease steroids continue other treatments    LOS: 9 days   Mani Celestin L 06/16/2014, 8:14 AM

## 2014-06-16 NOTE — Care Management Utilization Note (Signed)
UR completed 

## 2014-06-16 NOTE — Evaluation (Signed)
Physical Therapy Evaluation Patient Details Name: Yerachmiel Spinney MRN: [id number] DOB: Feb 08, 1941 Today's Date: 06/16/2014   History of Present Illness  Jed Kutch is a very pleasant 73 y.o. male a past medical history that includes atrial fibrillation, chronic diastolic heart failure, COPD on home oxygen at 2 L, diabetes present to the emergency department with the chief complaint of worsening shortness of breath. Initial evaluation in the emergency department he was found to be septic with elevated lactic acid, tachycardic, febrile, hypoxia, leukocytosis and chest x-ray concerning for pneumonia and pulmonary edema, acute renal failure and acute on chronic respiratory failure with hypoxia.  Patient reports he was doing well at home since his discharge about a month ago until yesterday evening. He found he was mildly short of breath and increased his oxygen flow from 2.0-2.5 L. He awakened this morning with worsening shortness of breath. Associated symptoms include mild nonproductive cough and 2 episodes of diarrhea and slight increase of lower extremity edema. He denies any chest pain palpitations. He does sleep propped up on one pillow and did so last night as well. He denies abdominal pain nausea vomiting fever chills. He denies dysuria hematuria frequency or urgency.  Pt underwent EGD with pylorus dilation and diopsy on 06/15/14 .    Clinical Impression  Pt is a 73 year old male who presents to PT with dx of sepsis.  Pt had a PT evaluation ~1 month ago and was being treated with HHPT; pt reports he was doing well at home with HHPT services.  During evaluation, pt was mod (I) with bed mobility skills and transfers, and supervision/min guard and use of std cane for gait in room.  Pt was able to amb to bathroom/sink, prior to a seated rest break secondary to fatigue.  Pt did report increased SOB after activity.  Recommend continued PT while in the hospital to address strengthening, activity tolerance, and  balance for improved functional mobility skills with transition to HHPT at discharge.  No DME recommendations.     Follow Up Recommendations Home health PT    Equipment Recommendations    None recommended by PT.       Precautions / Restrictions Precautions Precautions: Fall Restrictions Weight Bearing Restrictions: No      Mobility  Bed Mobility Overal bed mobility: Modified Independent                Transfers Overall transfer level: Modified independent                  Ambulation/Gait Ambulation/Gait assistance: Supervision;Min guard Ambulation Distance (Feet): 15 Feet Assistive device: Straight cane Gait Pattern/deviations: Step-through pattern;Decreased stride length   Gait velocity interpretation: Below normal speed for age/gender General Gait Details: Pt reports increased SOB with activity/gait around room (to bathroom/sink) and required seated rest break.      Balance Overall balance assessment: No apparent balance deficits (not formally assessed)                                           Pertinent Vitals/Pain Pain Assessment: No/denies pain    Home Living Family/patient expects to be discharged to:: Private residence Living Arrangements: Alone Available Help at Discharge: Family;Available PRN/intermittently (Niece lives in [place], sister and cousin ilve in [place]) Type of Home: House Home Access: Stairs to enter Entrance Stairs-Rails: Right Entrance Stairs-Number of Steps: 2 Home Layout: Laundry or work  area in basement Home Equipment: Walker - 2 wheels;Grab bars - tub/shower;Shower seat;Toilet riser      Prior Function Level of Independence: Independent with assistive device(s)         Comments: Pt reports he is mod (I) with bed mobilty skills, transfers, and household ambulation skills.  Pt reports HHPT recommended use of std cane for gait as needed.            Communication   Communication: No  difficulties  Cognition Arousal/Alertness: Awake/alert Behavior During Therapy: WFL for tasks assessed/performed Overall Cognitive Status: Within Functional Limits for tasks assessed                               Assessment/Plan    PT Assessment Patient needs continued PT services  PT Diagnosis Difficulty walking;Generalized weakness   PT Problem List Decreased activity tolerance;Decreased mobility  PT Treatment Interventions Gait training;Functional mobility training;Therapeutic activities;Therapeutic exercise   PT Goals (Current goals can be found in the Care Plan section) Acute Rehab PT Goals Patient Stated Goal: go home PT Goal Formulation: With patient Time For Goal Achievement: 06/30/14 Potential to Achieve Goals: Good    Frequency Min 3X/week    End of Session Equipment Utilized During Treatment: Gait belt;Oxygen (5L O2) Activity Tolerance: Patient limited by fatigue;Other (comment) (Limited by SOB) Patient left: in chair;with call bell/phone within reach;with chair alarm set           Time: [phone] PT Time Calculation (min): 22 min   Charges:   PT Evaluation $Initial PT Evaluation Tier I: 1 Procedure     Brigitta Pricer 06/16/2014, 10:26 AM

## 2014-06-16 NOTE — Progress Notes (Addendum)
[facility] PROGRESS NOTE  Hakop Humbarger ZOX:[id number] DOB: 04-01-1941 DOA: 06/07/2014 PCP: [name], PA-C   Summary:  73 y/o ?, known IPF, afib on elliquis, Dm ty 2, Htn, ,  hospital with progressive shortness of breath. He has known pulmonary fibrosis and appeared to have developed pneumonia.  He was admitted to step down unit and required bipap therapy. He was started on steroids, antibiotics and bronchodilators. He slowly started to improve. He was found to have pulmonary edema after IV hydration and was started on IV lasix with good effect. He has since been transitioned to oral lasix.  He has also undergone EGD for evaluation of possible gastric outlet obstruction with results as below. He has been slow to improve, but can hopefully be discharged home in the next few days.  Pulmonology has recommended bipap qhs on discharge and this has been arranged by care management.   Code Status: Full code Family Communication: Discussed with patient. No family at the bedside. Disposition Plan: To be determined.   Consultants:  Pulmonologist, Dr. Juanetta Gosling  Curbside consultation with intensivist, Sandrea Hughs M.D.  Gastroenterology  Procedures:  BiPAP Endoscopy: 1. PROBABLE CANDIDA ESOPHAGITIS  2. MODERATE Non-erosive gastritis AND MILD DUODENITIS  3. PYLORIC STENOSIS    Antibiotics:  Vancomycin 06/07/14>> 8/31  Zosyn 06/07/14>>8/31  HPI/Subjective:  Doing fair Feels better but not close to normal yet denies CP or sputum or fever No blurred or double vision  Objective: Filed Vitals:   06/16/14 1348  BP: 145/84  Pulse: 75  Temp: 97.9 F (36.6 C)  Resp: 18    Intake/Output Summary (Last 24 hours) at 06/16/14 1401 Last data filed at 06/16/14 1100  Gross per 24 hour  Intake    460 ml  Output   1200 ml  Net   -740 ml   Filed Weights   06/11/14 0500 06/12/14 0430 06/16/14 0500  Weight: 72.6 kg (160 lb 0.9 oz) 73.1 kg (161 lb 2.5 oz) 73.936 kg (163 lb)     Exam:   General:  Pleasant, alert 73 year old African-American man in no acute distress,  Cardiovascular: Irregular, irregular.   Respiratory: fine crackles bilaterally   Abdomen: Positive bowel sounds, soft  Musculoskeletal: No acute hot red joints. Pedal pulses palpable.  Neurologic: He is alert and oriented x3. His speech is clear.   Data Reviewed: Basic Metabolic Panel:  Recent Labs Lab 06/12/14 0448 06/13/14 0800 06/14/14 0555 06/15/14 0553 06/16/14 0650  NA 139 139 140 140 144  K 3.9 4.1 4.3 4.2 5.0  CL 94* 92* 96 96 97  CO2 33* 33* 33* 34* 35*  GLUCOSE 167* 216* 229* 175* 148*  BUN 31* 37* 42* 45* 42*  CREATININE 1.30 1.29 1.15 1.25 1.14  CALCIUM 8.5 8.8 8.8 9.1 9.5   Liver Function Tests:  Recent Labs Lab 06/12/14 0448 06/13/14 0800 06/14/14 0555 06/15/14 0553 06/16/14 0650  AST 89* 40* 33 51* 33  ALT 661* 464* 341* 294* 254*  ALKPHOS 46 44 41 49 55  BILITOT 1.6* 1.7* 1.5* 1.4* 1.6*  PROT 6.0 6.3 6.0 6.4 7.2  ALBUMIN 2.8* 3.0* 3.0* 3.1* 3.5   No results found for this basename: LIPASE, AMYLASE,  in the last 168 hours No results found for this basename: AMMONIA,  in the last 168 hours CBC:  Recent Labs Lab 06/10/14 0448 06/11/14 0454  WBC 11.2* 9.1  HGB 14.0 13.9  HCT 43.6 42.5  MCV 104.1* 104.2*  PLT 173 163   Cardiac Enzymes: No  results found for this basename: CKTOTAL, CKMB, CKMBINDEX, TROPONINI,  in the last 168 hours BNP (last 3 results)  Recent Labs  04/30/14 1115 06/07/14 1008 06/11/14 0800  PROBNP 1929.0* 10442.0* 2818.0*   CBG:  Recent Labs Lab 06/15/14 2031 06/16/14 0057 06/16/14 0451 06/16/14 0821 06/16/14 1142  GLUCAP 291* 223* 170* 123* 334*    Recent Results (from the past 240 hour(s))  CULTURE, BLOOD (ROUTINE X 2)     Status: None   Collection Time    06/07/14 10:45 AM      Result Value Ref Range Status   Specimen Description BLOOD RIGHT FOREARM DRAWN BY RN AMANDA   Final   Special Requests  BOTTLES DRAWN AEROBIC ONLY 4CC   Final   Culture NO GROWTH 5 DAYS   Final   Report Status 06/12/2014 FINAL   Final  CULTURE, BLOOD (ROUTINE X 2)     Status: None   Collection Time    06/07/14 11:18 AM      Result Value Ref Range Status   Specimen Description BLOOD LEFT ARM   Final   Special Requests BOTTLES DRAWN AEROBIC AND ANAEROBIC 6CC   Final   Culture NO GROWTH 5 DAYS   Final   Report Status 06/12/2014 FINAL   Final  URINE CULTURE     Status: None   Collection Time    06/07/14 12:30 PM      Result Value Ref Range Status   Specimen Description URINE, CATHETERIZED   Final   Special Requests NONE   Final   Culture  Setup Time     Final   Value: 06/07/2014 20:28     Performed at [facility] Count     Final   Value: NO GROWTH     Performed at [facility]   Culture     Final   Value: NO GROWTH     Performed at [facility]   Report Status 06/08/2014 FINAL   Final  MRSA PCR SCREENING     Status: None   Collection Time    06/07/14  2:05 PM      Result Value Ref Range Status   MRSA by PCR NEGATIVE  NEGATIVE Final   Comment:            The GeneXpert MRSA Assay (FDA     approved for NASAL specimens     only), is one component of a     comprehensive MRSA colonization     surveillance program. It is not     intended to diagnose MRSA     infection nor to guide or     monitor treatment for     MRSA infections.  CULTURE, EXPECTORATED SPUTUM-ASSESSMENT     Status: None   Collection Time    06/08/14  2:00 PM      Result Value Ref Range Status   Specimen Description SPUTUM EXPECTORATED   Final   Special Requests NONE   Final   Sputum evaluation     Final   Value: THIS SPECIMEN IS ACCEPTABLE. RESPIRATORY CULTURE REPORT TO FOLLOW.     Performed at [facility]   Report Status 06/09/2014 FINAL   Final  CULTURE, RESPIRATORY (NON-EXPECTORATED)     Status: None   Collection Time    06/08/14  2:00 PM      Result Value Ref Range Status    Specimen Description SPUTUM EXPECTORATED   Final   Special Requests  NONE   Final   Gram Stain     Final   Value: MODERATE WBC PRESENT,BOTH PMN AND MONONUCLEAR     FEW SQUAMOUS EPITHELIAL CELLS PRESENT     RARE GRAM POSITIVE COCCI     IN PAIRS FEW YEAST     Performed at [facility]   Culture     Final   Value: MODERATE CANDIDA ALBICANS     Performed at [facility]   Report Status 06/11/2014 FINAL   Final  KOH PREP     Status: None   Collection Time    06/15/14  3:30 PM      Result Value Ref Range Status   Specimen Description ESOPHAGUS BRUSHING   Final   Special Requests NONE   Final   KOH Prep     Final   Value: YEAST WITH PSEUDOHYPHAE     Performed at [facility]   Report Status 06/15/2014 FINAL   Final     Studies: No results found.  Scheduled Meds: . antiseptic oral rinse  7 mL Mouth Rinse q12n4p  . chlorhexidine  15 mL Mouth Rinse BID  . feeding supplement (PRO-STAT SUGAR FREE 64)  30 mL Oral BID BM  . fluconazole  100 mg Oral Daily  . furosemide  40 mg Oral Daily  . insulin aspart  0-9 Units Subcutaneous 6 times per day  . latanoprost  1 drop Left Eye QHS  . levalbuterol  0.63 mg Nebulization TID  . metoprolol tartrate  12.5 mg Oral BID  . pantoprazole  40 mg Oral BID AC  . predniSONE  40 mg Oral Q breakfast   Continuous Infusions:   Assessment and plan:  Principal Problem:   Sepsis Active Problems:   Hypertension   Atrial fibrillation   Acute on chronic diastolic heart failure   Pulmonary fibrosis   Acute renal failure   DM type 2 (diabetes mellitus, type 2)   HCAP (healthcare-associated pneumonia)   Acute respiratory failure with hypoxia   Hyperkalemia   Elevated transaminase level   Gastric distention    1. Sepsis with lactic acidosis, presumed to be secondary to healthcare acquired pneumonia and/or aspiration pneumonia. resolved  He was intermittently hypothermic and mildly febrile, but now his temperature is within  normal limits. Blood/urine cultures have not shown any growth. Sputum culture does not show any significant growth. abx d/c on 8/31 2. Possible superimposed pneumonitis versus aspiration pneumonitis.  He has completed a course of antibiotics. Also on IV Pepcid. 3. Severe pulmonary fibrosis. He is followed by Dr. Juanetta Gosling. Dr. Juanetta Gosling plans to refer the patient to the pulmonary fibrosis clinic at [facility]. He was recently started on a new medication for pulmonary fibrosis, OFEV. The patient had not been feeling well since starting this medication. It has been held. Transitioned from IV steroids-->po prednisone 06/16/14-pulmonary to comment on rec's for d/c course steroids 4. Acute on Chronic diastolic heart failure with possible acute diastolic failure. Patient had elevated BNP and chest x-ray indicated underlying pulmonary edema. Clinically he had evidence of peripheral edema. He was diuresed with intravenous lasix. Echocardiogram shows preserved ejection fraction. Now on oral Lasix 40 mg daily -Acute respiratory failure with hypoxia. Etiology multifactorial including HC AP/possible aspiration pneumonia/pneumonitis/severe pulmonary edema/and chronic heart failure. Patient has required bipap therapy qhs.  Will continue to wean off oxygen as tolerated. Currently on Laureles. Per pulmonology, patient will likely need BiPAP each bedtime on discharge 5. Marked gaseous distention of the stomach on  CT. Patient was seen by gastroenterology and underwent EGD on 9/1. Results indicated probable candida esophagitis, gastritis and pyloric stenosis. Stenosis was dilated with balloon. 6. Elevated LFTs, hepatitis. His AST and ALT  increased into the thousands. Etiology unclear. Viral markers were unremarkable an abdominal ultrasound did not have any significant findings. He has known fatty liver disease. Statin was discontinued. Gastroenterology is following. Likely related to ischemia in setting of sepsis. Liver enzymes are  trending down. Continue to follow 7. Acute renal failure. This was likely secondary to ATN and/or prerenal azotemia from his illness.The patient was hydrated with IV fluids and developed signs of volume overload. He was then diuresed well with lasix. Renal function has improved with lasix. Now on by mouth Lasix. Continue to follow. 8. Chronic atrial fibrillation His rate is currently controlled. Will continue low dosing of metoprolol. Anticoagulation re-started elliquis 06/16/14 9. Diabetes mellitus, type II; hypoglycemic on admission. Status post D50 in the ED. His blood glucose is better. He is on sliding scale NovoLog since being on IV steroids.Maryclare Labrador continue to monitor. Added back levemir 20 mg sq daily   Time spent: 30 Pleas Koch, MD [facility] [phone]

## 2014-06-16 NOTE — Progress Notes (Signed)
    Subjective: No abdominal pain, N/V. No GI complaints.   Objective: Vital signs in last 24 hours: Temp:  [97 F (36.1 C)-97.7 F (36.5 C)] 97 F (36.1 C) (09/02 0458) Pulse Rate:  [62-86] 62 (09/02 0458) Resp:  [13-33] 14 (09/02 0458) BP: (127-185)/(72-112) 158/95 mmHg (09/02 0458) SpO2:  [67 %-100 %] 93 % (09/02 0736) Weight:  [163 lb (73.936 kg)] 163 lb (73.936 kg) (09/02 0500) Last BM Date: 06/15/14 General:   Alert and oriented, pleasant Abdomen:  Bowel sounds present, soft, non-tender, non-distended. No HSM or hernias noted. No rebound or guarding. No masses appreciated  Neurologic:  Alert and  oriented x4;  grossly normal neurologically. Psych:  Alert and cooperative. Normal mood and affect.  Intake/Output from previous day: 09/01 0701 - 09/02 0700 In: 460 [P.O.:240; I.V.:220] Out: 1175 [Urine:1175] Intake/Output this shift:  BMET  Recent Labs  06/14/14 0555 06/15/14 0553 06/16/14 0650  NA 140 140 144  K 4.3 4.2 5.0  CL 96 96 97  CO2 33* 34* 35*  GLUCOSE 229* 175* 148*  BUN 42* 45* 42*  CREATININE 1.15 1.25 1.14  CALCIUM 8.8 9.1 9.5   LFT  Recent Labs  06/14/14 0555 06/15/14 0553 06/16/14 0650  PROT 6.0 6.4 7.2  ALBUMIN 3.0* 3.1* 3.5  AST 33 51* 33  ALT 341* 294* 254*  ALKPHOS 41 49 55  BILITOT 1.5* 1.4* 1.6*  BILIDIR 0.6* 0.5* 0.6*  IBILI 0.9 0.9 1.0*   PT/INR  Recent Labs  06/14/14 1018  LABPROT 18.1*  INR 1.50*    Assessment: 73 year old male admitted with sepsis secondary to healthcare acquired pneumonia with concern for aspiration pneumonia in the setting of severe pulmonary fibrosis; found to have marked gaseous distension of the stomach on CT but asymptomatic.Eliquis for history of afib: discontinued 8/25 with last dose 8/25 in the morning. EGD with candida esophagitis, moderate non-erosive gastritis and mild duodenitis, pyloric stenosis s/p dilation.    Elevated LFTs: transaminases bumped to thousands early in hospitalization.  Continued improvement, with transaminitis resolved. Viral markers negative and [place] of abdomen without acute findings. Known history of fatty liver. 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 Continue to follow to baseline.   Plan: Diflucan loading dose already received 9/1 for candida esophagitis. Continue 100 mg daily for total of 21 days BID PPI Low fat diet Reduce steroids when clinically possible Eliquis has been on hold: may resume Follow-up on biopsies   Nira Retort, ANP-BC [facility]     LOS: 9 days    06/16/2014, 7:37 AM

## 2014-06-17 LAB — GLUCOSE, CAPILLARY
Glucose-Capillary: 141 mg/dL — ABNORMAL HIGH (ref 70–99)
Glucose-Capillary: 158 mg/dL — ABNORMAL HIGH (ref 70–99)
Glucose-Capillary: 248 mg/dL — ABNORMAL HIGH (ref 70–99)

## 2014-06-17 LAB — BASIC METABOLIC PANEL
ANION GAP: 10 (ref 5–15)
BUN: 48 mg/dL — ABNORMAL HIGH (ref 6–23)
CO2: 36 meq/L — AB (ref 19–32)
Calcium: 9.7 mg/dL (ref 8.4–10.5)
Chloride: 97 mEq/L (ref 96–112)
Creatinine, Ser: 1.15 mg/dL (ref 0.50–1.35)
GFR calc Af Amer: 70 mL/min — ABNORMAL LOW (ref >90)
GFR calc non Af Amer: 60 mL/min — ABNORMAL LOW (ref >90)
Glucose, Bld: 164 mg/dL — ABNORMAL HIGH (ref 70–99)
Potassium: 5.5 mEq/L — ABNORMAL HIGH (ref 3.7–5.3)
SODIUM: 143 meq/L (ref 137–147)

## 2014-06-17 LAB — BLOOD GAS, ARTERIAL
Acid-Base Excess: 6.1 mmol/L — ABNORMAL HIGH (ref 0.0–2.0)
Bicarbonate: 29.9 mEq/L — ABNORMAL HIGH (ref 20.0–24.0)
Drawn by: 27407
O2 Content: 5 L/min
O2 Saturation: 91.9 %
PATIENT TEMPERATURE: 37
PH ART: 7.471 — AB (ref 7.350–7.450)
TCO2: 25.6 mmol/L (ref 0–100)
pCO2 arterial: 41.4 mmHg (ref 35.0–45.0)
pO2, Arterial: 66.5 mmHg — ABNORMAL LOW (ref 80.0–100.0)

## 2014-06-17 LAB — HEPATIC FUNCTION PANEL
ALT: 197 U/L — AB (ref 0–53)
AST: 31 U/L (ref 0–37)
Albumin: 3.5 g/dL (ref 3.5–5.2)
Alkaline Phosphatase: 60 U/L (ref 39–117)
BILIRUBIN INDIRECT: 0.9 mg/dL (ref 0.3–0.9)
Bilirubin, Direct: 0.5 mg/dL — ABNORMAL HIGH (ref 0.0–0.3)
TOTAL PROTEIN: 7 g/dL (ref 6.0–8.3)
Total Bilirubin: 1.4 mg/dL — ABNORMAL HIGH (ref 0.3–1.2)

## 2014-06-17 LAB — PROTIME-INR
INR: 1.83 — ABNORMAL HIGH (ref 0.00–1.49)
PROTHROMBIN TIME: 21.2 s — AB (ref 11.6–15.2)

## 2014-06-17 MED ORDER — PREDNISONE 20 MG PO TABS: ORAL_TABLET | ORAL | Status: AC

## 2014-06-17 MED ORDER — ALBUTEROL SULFATE (2.5 MG/3ML) 0.083% IN NEBU: 2.5000 mg | INHALATION_SOLUTION | RESPIRATORY_TRACT | Status: AC | PRN

## 2014-06-17 MED ORDER — TRAZODONE 25 MG HALF TABLET: 25.0000 mg | ORAL_TABLET | Freq: Every evening | ORAL | Status: AC | PRN

## 2014-06-17 MED ORDER — FLUCONAZOLE 100 MG PO TABS: 100.0000 mg | ORAL_TABLET | Freq: Every day | ORAL | Status: AC

## 2014-06-17 MED ORDER — PANTOPRAZOLE SODIUM 40 MG PO TBEC: 40.0000 mg | DELAYED_RELEASE_TABLET | Freq: Two times a day (BID) | ORAL | Status: DC

## 2014-06-17 NOTE — Plan of Care (Signed)
Problem: Discharge Progression Outcomes Goal: Dyspnea controlled Outcome: Progressing Patient being discharged with oxygen.

## 2014-06-17 NOTE — Plan of Care (Signed)
Problem: Discharge Progression Outcomes Goal: O2 sats > or equal 90% or at baseline Outcome: Adequate for Discharge Saturation 92%,on 5 Liters nasal canula.

## 2014-06-17 NOTE — Progress Notes (Signed)
Mr Joines,discharged home with instructions given on medications,and follow up visits,patient,and son verbalized understanding.Prescriptions were sent to Pharmacy of choice documented on AVS.Home Health services to follow up with patient at home.Oxygen at 5 Liters nasal canula.No c/o pain or discomfort noted. Accompanied by staff to an awaiting vehicle.

## 2014-06-17 NOTE — Discharge Summary (Signed)
Physician Discharge Summary  Rahmir Beever ONG:[id number] DOB: 25-Sep-1941 DOA: 06/07/2014  PCP: [name], PA-C  Admit date: 06/07/2014 Discharge date: 06/17/2014  Time spent: 45 minutes  Recommendations for Outpatient Follow-up:  1. Patient complete fluconazole 06/28/49 2. Needs Chem-12 as well as CBC in about a week-had mild hyperkalemia likely secondary to combination of contraction alkalosis from Lasix as well as secondary compensation of his respiratory acidosis 3. Need home health physical therapy 4. Patient will need to have BiPAP as he has failed CPAP use. This can be followed up as an outpatient by his pulmonologist 5. He will be referred as an outpatient [facility] pulmonary fibrosis clinic-this will be handled by Dr. Kari Baars probably as an outpatient 6. He'll be discharged home on oxygen 5 L  Discharge Diagnoses:  Principal Problem:   Sepsis Active Problems:   Hypertension   Atrial fibrillation   Acute on chronic diastolic heart failure   Pulmonary fibrosis   Acute renal failure   DM type 2 (diabetes mellitus, type 2)   HCAP (healthcare-associated pneumonia)   Acute respiratory failure with hypoxia   Hyperkalemia   Elevated transaminase level   Gastric distention   Discharge Condition: Stable   Diet recommendation: Heart healthy  Filed Weights   06/11/14 0500 06/12/14 0430 06/16/14 0500  Weight: 72.6 kg (160 lb 0.9 oz) 73.1 kg (161 lb 2.5 oz) 73.936 kg (163 lb)    History of present illness:  73 y/o ?, known IPF, afib on elliquis, Dm ty 2, Htn, , hospital with progressive shortness of breath. He has known pulmonary fibrosis and appeared to have developed pneumonia. He was admitted to step down unit and required bipap therapy. He was started on steroids, antibiotics and bronchodilators. He slowly started to improve. He was found to have pulmonary edema after IV hydration and was started on IV lasix with good effect. He has since been  transitioned to oral lasix. He has also undergone EGD for evaluation of possible gastric outlet obstruction with results as below. He has been slow to improve, but can hopefully be discharged home in the next few days. Pulmonology has recommended bipap qhs on discharge and this has been arranged by care management.    Hospital Course:  .Sepsis with lactic acidosis, presumed to be secondary to healthcare acquired pneumonia and/or aspiration pneumonia.  resolved  He was intermittently hypothermic and mildly febrile, but now his temperature is within normal limits. Blood/urine cultures have not shown any growth. Sputum culture does not show any significant growth. abx d/c on 8/31  2. Possible superimposed pneumonitis versus aspiration pneumonitis.  He has completed a course of antibiotics. Continue pantoprazole 40 twice a day as well 3. Severe pulmonary fibrosis.  He is followed by Dr. Juanetta Gosling. Dr. Juanetta Gosling plans to refer the patient to the pulmonary fibrosis clinic at [facility]. He was recently started on a new medication for pulmonary fibrosis, OFEV. The patient had not been feeling well since starting this medication. It has been held. Transitioned from IV steroids-->po prednisone 06/16/14.  he'll be discharged on a tapering dose of the steroids and I addressed his concerns regarding elevated blood sugars on last hospital stay. This will need to be closely monitored by primary care physician as well as RN as he tapers down off of the steroids  Acute on Chronic diastolic heart failure with possible acute diastolic failure.  Patient had elevated BNP and chest x-ray indicated underlying pulmonary edema. Clinically he had evidence of peripheral edema.  He was diuresed with intravenous lasix. Echocardiogram shows preserved ejection fraction. Now on oral Lasix 40 mg daily   -Acute respiratory failure with hypoxia.  Etiology multifactorial including HC AP/possible aspiration pneumonia/pneumonitis/severe pulmonary  edema/and chronic heart failure. Patient has required bipap therapy qhs. Will continue to wean off oxygen as tolerated. Currently on [place] 5 L. Per pulmonology, patient will likely need BiPAP each bedtime on discharge  5. Marked gaseous distention of the stomach on CT.  Patient was seen by gastroenterology and underwent EGD on 9/1. Results indicated probable candida esophagitis, gastritis and pyloric stenosis. Stenosis was dilated with balloon.  He will continue treatment with Diflucan until 06/29/2049 6. Elevated LFTs, hepatitis.  His AST and ALT increased into the thousands. Etiology unclear. Viral markers were unremarkable an abdominal ultrasound did not have any significant findings. He has known fatty liver disease. Statin was discontinued. Gastroenterology is following. Likely related to ischemia in setting of sepsis. Liver enzymes are trending down. Continue to follow  7. Acute renal failure.  This was likely secondary to ATN and/or prerenal azotemia from his illness.The patient was hydrated with IV fluids and developed signs of volume overload. He was then diuresed well with lasix. Renal function has improved with lasix. Now on by mouth Lasix. See above discussion and He started developing a contraction alkalosis which is multifactorial and his Lasix has been transitioned back to every other day M./W./F. on discharge 20 mg 8. Chronic atrial fibrillation  His rate is currently controlled. Will continue low dosing of metoprolol. Anticoagulation re-started elliquis 06/16/14  9. Diabetes mellitus, type II; hypoglycemic on admission.  Status post D50 in the ED. His blood glucose is better. He is on sliding scale NovoLog since being on IV steroids.Maryclare Labrador continue to monitor.  Added back levemir 20 mg sq daily  I HAVE REASSURED HIM ABOUT HIS BLOOD SUGARS    Consultants:  Pulmonologist, Dr. Juanetta Gosling  Curbside consultation with intensivist, Sandrea Hughs M.D.  Gastroenterology Procedures:   BiPAP Endoscopy: 1. PROBABLE CANDIDA ESOPHAGITIS  2. MODERATE Non-erosive gastritis AND MILD DUODENITIS  3. PYLORIC STENOSIS   Discharge Exam: Filed Vitals:   06/17/14 0622  BP: 148/80  Pulse: 70  Temp: 97.5 F (36.4 C)  Resp: 20    General:  alert pleasant frail Cardiovascular:  S1-S2 no murmur rub or gallop  Respiratory:  clinically seems improved   Discharge Instructions You were cared for by a hospitalist during your hospital stay. If you have any questions about your discharge medications or the care you received while you were in the hospital after you are discharged, you can call the unit and asked to speak with the hospitalist on call if the hospitalist that took care of you is not available. Once you are discharged, your primary care physician will handle any further medical issues. Please note that NO REFILLS for any discharge medications will be authorized once you are discharged, as it is imperative that you return to your primary care physician (or establish a relationship with a primary care physician if you do not have one) for your aftercare needs so that they can reassess your need for medications and monitor your lab values.  Discharge Instructions   Diet - low sodium heart healthy    Complete by:  As directed      Discharge instructions    Complete by:  As directed   We will therapist come to her home to help you with mobility We will also get a nurse come at  her home to help you arrange her medications Please followup with her regular physician and have a blood sugars checked at least twice a day by the nurse or by itself as he will be on a lowering dose of steroids You have been placed on fluconazole which is an antifungal medication as they found some fungus in your gastrointestinal tract. He would need complete treatment of this completely. Please followup with Dr. Juanetta Gosling for the arrangements being made to have you referred to [facility] for your  interstitial pulmonary fibrosis I would recommend labs in about one week at her regular physician's office     Increase activity slowly    Complete by:  As directed           Current Discharge Medication List    START taking these medications   Details  albuterol (PROVENTIL) (2.5 MG/3ML) 0.083% nebulizer solution Take 3 mLs (2.5 mg total) by nebulization every 2 (two) hours as needed for wheezing or shortness of breath. Qty: 75 mL, Refills: 12    fluconazole (DIFLUCAN) 100 MG tablet Take 1 tablet (100 mg total) by mouth daily. Qty: 14 tablet, Refills: 0    pantoprazole (PROTONIX) 40 MG tablet Take 1 tablet (40 mg total) by mouth 2 (two) times daily before a meal. Qty: 60 tablet, Refills: 0    predniSONE (DELTASONE) 20 MG tablet 40 mg for 5 days, 30 mg for 5 days 20 mg for 5 days and then 10 mg to continue until sees DUKE Qty: 45 tablet, Refills: 0    traZODone (DESYREL) 25 mg TABS tablet Take 0.5 tablets (25 mg total) by mouth at bedtime as needed for sleep. Qty: 30 tablet, Refills: 0      CONTINUE these medications which have NOT CHANGED   Details  apixaban (ELIQUIS) 5 MG TABS tablet Take 5 mg by mouth 2 (two) times daily.    aspirin EC 81 MG tablet Take 81 mg by mouth daily.    atorvastatin (LIPITOR) 20 MG tablet Take 10 mg by mouth daily.    BEE POLLEN PO Take 1 capsule by mouth daily.    furosemide (LASIX) 20 MG tablet Take 20 mg by mouth every Monday, Wednesday, and Friday.    insulin detemir (LEVEMIR) 100 UNIT/ML injection Inject 0.2 mLs (20 Units total) into the skin daily. Qty: 10 mL, Refills: 11    latanoprost (XALATAN) 0.005 % ophthalmic solution Place 1 drop into the left eye at bedtime.    magnesium oxide (MAG-OX) 400 MG tablet Take 400 mg by mouth daily at 6 PM.    metFORMIN (GLUCOPHAGE) 500 MG tablet Take 1 tablet (500 mg total) by mouth 2 (two) times daily with a meal. Qty: 60 tablet, Refills: 1    metoprolol tartrate (LOPRESSOR) 25 MG tablet Take 25  mg by mouth 2 (two) times daily.    tadalafil (CIALIS) 20 MG tablet Take 20 mg by mouth daily as needed for erectile dysfunction.      STOP taking these medications     Nintedanib Esylate (OFEV) 150 MG CAPS        No Known Allergies Follow-up Information   Please follow up.   Contact information:   [facility] [phone]       The results of significant diagnostics from this hospitalization (including imaging, microbiology, ancillary and laboratory) are listed below for reference.    Significant Diagnostic Studies: Ct Abdomen Pelvis Wo Contrast  06/07/2014   CLINICAL DATA:  Acute respiratory  failure. Evaluate for pneumonia versus edema.  EXAM: CT CHEST, ABDOMEN AND PELVIS WITHOUT CONTRAST  TECHNIQUE: Multidetector CT imaging of the chest, abdomen and pelvis was performed following the standard protocol without IV contrast.  COMPARISON:  Chest radiograph 06/07/2014 and CT chest without contrast and high-resolution images on 12/11/2013  FINDINGS: CT CHEST FINDINGS  Cardiomegaly with biatrial enlargement appears similar to prior chest CT. Negative for pericardial effusion. Atherosclerotic calcification of the normal caliber thoracic aorta, proximal great vessels, left anterior descending coronary artery and right coronary artery are stable. Esophagus is unremarkable. A prevascular lymph node is mildly prominent measuring 7.6 mm (previously 7 mm). Precarinal lymphadenopathy has progressed since prior chest CT of 12/11/2013. This prominent lymph node currently measures 2.1 cm AP diameter (previously 1.6 cm). Subcarinal lymph node is now 14 mm AP diameter (previously 10 mm).  There is a trace amount of pleural fluid posteriorly on the left. No pleural effusion on the right.  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 There has been a  significant interval change in appearance of the lungs since the prior chest CT of 12/11/2013. There are now extensive multifocal bilateral patchy areas of airspace disease, right greater than left, superimposed on the chronic interstitial lung disease. This manifests as extensive ground-glass opacities bilaterally with focal areas of sparing. Lung apices are fairly spared. There is no intralobular septal thickening to suggest pulmonary edema. The trachea can't mainstem bronchi are patent. Negative for pneumothorax. Lung volumes are slightly low. There is some respiratory motion artifact at the level of the mid chest. This results in an artifactually abnormal appearance of the sternum on the reformatted sagittal views. Thoracic spine vertebral bodies are normal in height and alignment.  Densely sclerotic lesion in the posterior right second rib is stable. Densely sclerotic lesion in the posterior right fifth rib is also stable. The sclerotic lesions are well-circumscribed and appear benign.  CT ABDOMEN AND PELVIS FINDINGS  There is marked gaseous distention of the stomach. Gastric wall thickness appears normal. There is an air-fluid level in the fundus of the stomach and another air-fluid level in the pylorus/duodenal bulb. Gastric outlet obstruction cannot be excluded.  Small bowel loops and colon are normal in caliber.  The noncontrast appearance of the liver, spleen, pancreas, and kidneys is within normal limits. Negative for hydronephrosis. There is bilateral perinephric stranding, nonspecific finding. Both ureters are normal in caliber. No urinary tract stone disease is seen.  There is bilateral thickening of the adrenal glands without discrete nodules or masses.  There is heavy atherosclerotic calcification of the normal caliber abdominal aorta. Scattered atherosclerotic calcification of the iliac vasculature without aneurysm.  Normal appendix.  There is a amorphous high density within the gallbladder lumen.  This was not present on prior chest CT. This could reflect gallbladder sludge and/or small gallstones.  Urinary bladder decompressed by Foley catheter. There is some air within the urinary bladder that is likely secondary to the presence of the Foley. Prostate gland appears within normal limits for size and contains some internal calcifications. Small amount of free fluid is noted in the dependent portion the pelvis. Negative for free intraperitoneal air or lymphadenopathy in the abdomen or pelvis.  There is a mild convex left in the curvature of the upper to mid lumbar spine and a mild convex right curvature at the L4-L5 level. There is partial fusion of the L4-L5  vertebral bodies along the left lateral aspect, nonsurgical. Significant disc space narrowing at L4-5 and L5-S1. No suspicious osseous lesions.  IMPRESSION: 1. Extensive bilateral airspace disease superimposed on the patient's chronic interstitial lung disease (see high-resolution chest CT performed in February 2015). Findings could reflect an acute exacerbation of usual interstitial pneumonitis or could reflect acute multifocal bilateral infectious pneumonia superimposed on chronic interstitial lung disease. There is a trace left pleural effusion. 2. Progression of reactive mediastinal lymphadenopathy, likely secondary to the current acute lung pathology. 3. Marked gaseous distention of the stomach. This could place the patient at increased risk for aspiration. Gastric outlet obstruction cannot be completely excluded. Findings were discussed by telephone with the patient's nurse Marchelle Folks, at 7:35 p.m., 06/07/2014. 4. Stable cardiomegaly with biatrial enlargement. 5. Extensive atherosclerosis, including the coronary arteries. 6. Amorphous high density within the gallbladder lumen for which gallbladder sludge and/or small stones cannot be excluded.   Electronically Signed   By: Britta Mccreedy M.D.   On: 06/07/2014 19:44   Ct Chest Wo Contrast  06/07/2014    CLINICAL DATA:  Acute respiratory failure. Evaluate for pneumonia versus edema.  EXAM: CT CHEST, ABDOMEN AND PELVIS WITHOUT CONTRAST  TECHNIQUE: Multidetector CT imaging of the chest, abdomen and pelvis was performed following the standard protocol without IV contrast.  COMPARISON:  Chest radiograph 06/07/2014 and CT chest without contrast and high-resolution images on 12/11/2013  FINDINGS: CT CHEST FINDINGS  Cardiomegaly with biatrial enlargement appears similar to prior chest CT. Negative for pericardial effusion. Atherosclerotic calcification of the normal caliber thoracic aorta, proximal great vessels, left anterior descending coronary artery and right coronary artery are stable. Esophagus is unremarkable. A prevascular lymph node is mildly prominent measuring 7.6 mm (previously 7 mm). Precarinal lymphadenopathy has progressed since prior chest CT of 12/11/2013. This prominent lymph node currently measures 2.1 cm AP diameter (previously 1.6 cm). Subcarinal lymph node is now 14 mm AP diameter (previously 10 mm).  There is a trace amount of pleural fluid posteriorly on the left. No pleural effusion on the right.  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 There has been a significant interval change in appearance of the lungs since the prior chest CT of 12/11/2013. There are now extensive multifocal bilateral patchy areas of airspace disease, right greater than left, superimposed on the chronic interstitial lung disease. This manifests as extensive ground-glass opacities bilaterally with focal areas of sparing. Lung apices are fairly spared. There is no intralobular septal thickening to suggest pulmonary edema. The trachea can't mainstem bronchi are patent. Negative for pneumothorax. Lung volumes are slightly low. There is some respiratory motion artifact at the level of  the mid chest. This results in an artifactually abnormal appearance of the sternum on the reformatted sagittal views. Thoracic spine vertebral bodies are normal in height and alignment.  Densely sclerotic lesion in the posterior right second rib is stable. Densely sclerotic lesion in the posterior right fifth rib is also stable. The sclerotic lesions are well-circumscribed and appear benign.  CT ABDOMEN AND PELVIS FINDINGS  There is marked gaseous distention of the stomach. Gastric wall thickness appears normal. There is an air-fluid level in the fundus of the stomach and another air-fluid level in the pylorus/duodenal bulb. Gastric outlet obstruction cannot be excluded.  Small bowel loops and colon are normal in caliber.  The noncontrast appearance of the liver, spleen, pancreas, and kidneys is  within normal limits. Negative for hydronephrosis. There is bilateral perinephric stranding, nonspecific finding. Both ureters are normal in caliber. No urinary tract stone disease is seen.  There is bilateral thickening of the adrenal glands without discrete nodules or masses.  There is heavy atherosclerotic calcification of the normal caliber abdominal aorta. Scattered atherosclerotic calcification of the iliac vasculature without aneurysm.  Normal appendix.  There is a amorphous high density within the gallbladder lumen. This was not present on prior chest CT. This could reflect gallbladder sludge and/or small gallstones.  Urinary bladder decompressed by Foley catheter. There is some air within the urinary bladder that is likely secondary to the presence of the Foley. Prostate gland appears within normal limits for size and contains some internal calcifications. Small amount of free fluid is noted in the dependent portion the pelvis. Negative for free intraperitoneal air or lymphadenopathy in the abdomen or pelvis.  There is a mild convex left in the curvature of the upper to mid lumbar spine and a mild convex right  curvature at the L4-L5 level. There is partial fusion of the L4-L5 vertebral bodies along the left lateral aspect, nonsurgical. Significant disc space narrowing at L4-5 and L5-S1. No suspicious osseous lesions.  IMPRESSION: 1. Extensive bilateral airspace disease superimposed on the patient's chronic interstitial lung disease (see high-resolution chest CT performed in February 2015). Findings could reflect an acute exacerbation of usual interstitial pneumonitis or could reflect acute multifocal bilateral infectious pneumonia superimposed on chronic interstitial lung disease. There is a trace left pleural effusion. 2. Progression of reactive mediastinal lymphadenopathy, likely secondary to the current acute lung pathology. 3. Marked gaseous distention of the stomach. This could place the patient at increased risk for aspiration. Gastric outlet obstruction cannot be completely excluded. Findings were discussed by telephone with the patient's nurse Marchelle Folks, at 7:35 p.m., 06/07/2014. 4. Stable cardiomegaly with biatrial enlargement. 5. Extensive atherosclerosis, including the coronary arteries. 6. Amorphous high density within the gallbladder lumen for which gallbladder sludge and/or small stones cannot be excluded.   Electronically Signed   By: Britta Mccreedy M.D.   On: 06/07/2014 19:44   US Abdomen Complete  06/08/2014   CLINICAL DATA:  Elevated liver function tests.  EXAM: ULTRASOUND ABDOMEN COMPLETE  COMPARISON:  CT, 06/07/2014.  FINDINGS: Gallbladder:  Only mildly distended. No convincing stone. No wall thickening or pericholecystic fluid.  Common bile duct:  Diameter: 4.2 mm.  Not seen distally.  Liver:  Mild increased echogenicity. Liver normal in size. No mass or focal lesion. Hepatopetal flow was documented in the portal vein.  IVC:  No abnormality visualized.  Pancreas:  Minimally visualized, mostly obscured by bowel gas. Portions seen are unremarkable.  Spleen:  Not seen.  Left upper quadrant obscured by  bowel gas.  Right Kidney:  Length: 11.2 cm. Echogenicity within normal limits. No mass or hydronephrosis visualized.  Left Kidney:  Length: 12.0 cm. Echogenicity within normal limits. No mass or hydronephrosis visualized.  Abdominal aorta:  No convincing aneurysm.  Limited visualization.  Other findings:  None.  IMPRESSION: 1. No acute findings. Exam was limited due to the patient's heavy breathing and increased bowel gas. 2. Probable hepatic steatosis. 3. Pancreas not well visualized.  Spleen not visualized.   Electronically Signed   By: Amie Portland M.D.   On: 06/08/2014 14:08   Dg Chest Port 1 View  06/12/2014   CLINICAL DATA:  Followup chest radiograph.  Pneumonia.  EXAM: PORTABLE CHEST - 1 VIEW  COMPARISON:  06/11/2014.  FINDINGS:  Bilateral irregular interstitial thickening with heterogeneous areas of airspace opacity are stable. Stable cardiomegaly. No pneumothorax.  IMPRESSION: No change from the previous day's study.   Electronically Signed   By: Amie Portland M.D.   On: 06/12/2014 07:38   Dg Chest Port 1 View  06/11/2014   CLINICAL DATA:  Pneumonia. Atrial fibrillation. Congestive heart failure. Pulmonary fibrosis. Acute renal failure.  EXAM: PORTABLE CHEST - 1 VIEW  COMPARISON:  06/09/2014  FINDINGS: Cardiomegaly stable. Diffuse bilateral airspace disease shows no significant change. Cardiomegaly stable. Elevation of left hemidiaphragm is again demonstrated. No evidence of pleural effusion or pneumothorax.  IMPRESSION: No significant change compared with prior exam.   Electronically Signed   By: Myles Rosenthal M.D.   On: 06/11/2014 09:34   Dg Chest Port 1 View  06/09/2014   CLINICAL DATA:  Pneumonia.  EXAM: PORTABLE CHEST - 1 VIEW  COMPARISON:  CT chest and chest radiograph 01/05/2014.  FINDINGS: Trachea is midline. Heart is enlarged. Diffuse bilateral airspace disease persists. Aeration may have improved slightly in the right lung in the interval. No definite pleural fluid. Left hemidiaphragm is  elevated.  IMPRESSION: Persistent diffuse bilateral airspace disease, with mild coarsening. Findings may be due to edema, superimposed on known pulmonary fibrosis.   Electronically Signed   By: Leanna Battles M.D.   On: 06/09/2014 07:44   Dg Chest Portable 1 View  06/07/2014   CLINICAL DATA:  Shortness of breath.  EXAM: PORTABLE CHEST - 1 VIEW  COMPARISON:  05/10/2014 and the CT of 12/11/2013  FINDINGS: Midline trachea. Mild cardiomegaly. Pulmonary artery enlargement. Moderate left hemidiaphragm elevation. No left and no definite right pleural effusion. No pneumothorax. Progressive interstitial prominence, especially on the right. Suspicion of right lower lobe airspace disease. Left upper abdominal gas is likely within prominent transverse and splenic flexure colon.  IMPRESSION: Progressive interstitial prominence, especially on the right. Question usual interstitial pneumonia with superimposed interstitial edema or atypical infection. Consider short term radiographic followup.  Cannot exclude right lower lobe concurrent airspace disease/pneumonia.  At minimum, PA and lateral radiographs should be considered. Depending on clinical symptomatology, repeat CT may also be informative.  Pulmonary artery enlargement suggests pulmonary arterial hypertension.   Electronically Signed   By: Jeronimo Greaves M.D.   On: 06/07/2014 10:10    Microbiology: Recent Results (from the past 240 hour(s))  CULTURE, BLOOD (ROUTINE X 2)     Status: None   Collection Time    06/07/14 10:45 AM      Result Value Ref Range Status   Specimen Description BLOOD RIGHT FOREARM DRAWN BY RN [facility]   Final   Special Requests BOTTLES DRAWN AEROBIC ONLY 4CC   Final   Culture NO GROWTH 5 DAYS   Final   Report Status 06/12/2014 FINAL   Final  CULTURE, BLOOD (ROUTINE X 2)     Status: None   Collection Time    06/07/14 11:18 AM      Result Value Ref Range Status   Specimen Description BLOOD LEFT ARM   Final   Special Requests BOTTLES DRAWN  AEROBIC AND ANAEROBIC 6CC   Final   Culture NO GROWTH 5 DAYS   Final   Report Status 06/12/2014 FINAL   Final  URINE CULTURE     Status: None   Collection Time    06/07/14 12:30 PM      Result Value Ref Range Status   Specimen Description URINE, CATHETERIZED   Final   Special Requests NONE  Final   Culture  Setup Time     Final   Value: 06/07/2014 20:28     Performed at [facility]   Colony Count     Final   Value: NO GROWTH     Performed at [facility]   Culture     Final   Value: NO GROWTH     Performed at [facility]   Report Status 06/08/2014 FINAL   Final  MRSA PCR SCREENING     Status: None   Collection Time    06/07/14  2:05 PM      Result Value Ref Range Status   MRSA by PCR NEGATIVE  NEGATIVE Final   Comment:            The GeneXpert MRSA Assay (FDA     approved for NASAL specimens     only), is one component of a     comprehensive MRSA colonization     surveillance program. It is not     intended to diagnose MRSA     infection nor to guide or     monitor treatment for     MRSA infections.  CULTURE, EXPECTORATED SPUTUM-ASSESSMENT     Status: None   Collection Time    06/08/14  2:00 PM      Result Value Ref Range Status   Specimen Description SPUTUM EXPECTORATED   Final   Special Requests NONE   Final   Sputum evaluation     Final   Value: THIS SPECIMEN IS ACCEPTABLE. RESPIRATORY CULTURE REPORT TO FOLLOW.     Performed at [facility]   Report Status 06/09/2014 FINAL   Final  CULTURE, RESPIRATORY (NON-EXPECTORATED)     Status: None   Collection Time    06/08/14  2:00 PM      Result Value Ref Range Status   Specimen Description SPUTUM EXPECTORATED   Final   Special Requests NONE   Final   Gram Stain     Final   Value: MODERATE WBC PRESENT,BOTH PMN AND MONONUCLEAR     FEW SQUAMOUS EPITHELIAL CELLS PRESENT     RARE GRAM POSITIVE COCCI     IN PAIRS FEW YEAST     Performed at [facility]   Culture     Final    Value: MODERATE CANDIDA ALBICANS     Performed at [facility]   Report Status 06/11/2014 FINAL   Final  KOH PREP     Status: None   Collection Time    06/15/14  3:30 PM      Result Value Ref Range Status   Specimen Description ESOPHAGUS BRUSHING   Final   Special Requests NONE   Final   KOH Prep     Final   Value: YEAST WITH PSEUDOHYPHAE     Performed at [facility]   Report Status 06/15/2014 FINAL   Final     Labs: Basic Metabolic Panel:  Recent Labs Lab 06/13/14 0800 06/14/14 0555 06/15/14 0553 06/16/14 0650 06/17/14 0557  NA 139 140 140 144 143  K 4.1 4.3 4.2 5.0 5.5*  CL 92* 96 96 97 97  CO2 33* 33* 34* 35* 36*  GLUCOSE 216* 229* 175* 148* 164*  BUN 37* 42* 45* 42* 48*  CREATININE 1.29 1.15 1.25 1.14 1.15  CALCIUM 8.8 8.8 9.1 9.5 9.7   Liver Function Tests:  Recent Labs Lab 06/13/14 0800 06/14/14 0555 06/15/14 0553 06/16/14 0650 06/17/14 0557  AST 40* 33 51* 33 31  ALT 464* 341* 294* 254* 197*  ALKPHOS 44 41 49 55 60  BILITOT 1.7* 1.5* 1.4* 1.6* 1.4*  PROT 6.3 6.0 6.4 7.2 7.0  ALBUMIN 3.0* 3.0* 3.1* 3.5 3.5   No results found for this basename: LIPASE, AMYLASE,  in the last 168 hours No results found for this basename: AMMONIA,  in the last 168 hours CBC:  Recent Labs Lab 06/11/14 0454  WBC 9.1  HGB 13.9  HCT 42.5  MCV 104.2*  PLT 163   Cardiac Enzymes: No results found for this basename: CKTOTAL, CKMB, CKMBINDEX, TROPONINI,  in the last 168 hours BNP: BNP (last 3 results)  Recent Labs  04/30/14 1115 06/07/14 1008 06/11/14 0800  PROBNP 1929.0* 10442.0* 2818.0*   CBG:  Recent Labs Lab 06/16/14 1637 06/16/14 2025 06/17/14 0056 06/17/14 0407 06/17/14 0751  GLUCAP 331* 300* 248* 141* 158*       Signed:  Rhetta Mura  [facility] 06/17/2014, 10:44 AM

## 2014-06-17 NOTE — Progress Notes (Signed)
Subjective: He says he feels better. He has no new complaints. His cough is less. He is less short of breath. He was scheduled to start back on OFEV but does not think he got it yesterday.  Objective: Vital signs in last 24 hours: Temp:  [97.5 F (36.4 C)-97.9 F (36.6 C)] 97.5 F (36.4 C) (09/03 0622) Pulse Rate:  [70-100] 70 (09/03 0622) Resp:  [18-20] 20 (09/03 0622) BP: (141-148)/(80-90) 148/80 mmHg (09/03 0622) SpO2:  [90 %-94 %] 94 % (09/03 0717) Weight change:  Last BM Date: 06/15/14  Intake/Output from previous day: 09/02 0701 - 09/03 0700 In: 240 [P.O.:240] Out: 250 [Urine:250]  PHYSICAL EXAM General appearance: alert, cooperative and mild distress Resp: Rales in both bases his rhonchi have cleared Cardio: He is in chronic atrial fibrillation GI: soft, non-tender; bowel sounds normal; no masses,  no organomegaly Extremities: extremities normal, atraumatic, no cyanosis or edema  Lab Results:  Results for orders placed during the hospital encounter of 06/07/14 (from the past 48 hour(s))  GLUCOSE, CAPILLARY     Status: Abnormal   Collection Time    06/15/14 11:29 AM      Result Value Ref Range   Glucose-Capillary 164 (*) 70 - 99 mg/dL  GLUCOSE, CAPILLARY     Status: Abnormal   Collection Time    06/15/14  2:32 PM      Result Value Ref Range   Glucose-Capillary 179 (*) 70 - 99 mg/dL  KOH PREP     Status: None   Collection Time    06/15/14  3:30 PM      Result Value Ref Range   Specimen Description ESOPHAGUS BRUSHING     Special Requests NONE     KOH Prep       Value: YEAST WITH PSEUDOHYPHAE     Performed at [facility]   Report Status 06/15/2014 FINAL    GLUCOSE, CAPILLARY     Status: Abnormal   Collection Time    06/15/14  5:29 PM      Result Value Ref Range   Glucose-Capillary 170 (*) 70 - 99 mg/dL  GLUCOSE, CAPILLARY     Status: Abnormal   Collection Time    06/15/14  8:31 PM      Result Value Ref Range   Glucose-Capillary 291 (*) 70 - 99  mg/dL   Comment 1 Notify RN     Comment 2 Documented in Chart    GLUCOSE, CAPILLARY     Status: Abnormal   Collection Time    06/16/14 12:57 AM      Result Value Ref Range   Glucose-Capillary 223 (*) 70 - 99 mg/dL  GLUCOSE, CAPILLARY     Status: Abnormal   Collection Time    06/16/14  4:51 AM      Result Value Ref Range   Glucose-Capillary 170 (*) 70 - 99 mg/dL   Comment 1 Notify RN     Comment 2 Documented in Chart    BASIC METABOLIC PANEL     Status: Abnormal   Collection Time    06/16/14  6:50 AM      Result Value Ref Range   Sodium 144  137 - 147 mEq/L   Potassium 5.0  3.7 - 5.3 mEq/L   Chloride 97  96 - 112 mEq/L   CO2 35 (*) 19 - 32 mEq/L   Glucose, Bld 148 (*) 70 - 99 mg/dL   BUN 42 (*) 6 - 23 mg/dL   Creatinine, Ser  1.14  0.50 - 1.35 mg/dL   Calcium 9.5  8.4 - 10.5 mg/dL   GFR calc non Af Amer 61 (*) >90 mL/min   GFR calc Af Amer 71 (*) >90 mL/min   Comment: (NOTE)     The eGFR has been calculated using the CKD EPI equation.     This calculation has not been validated in all clinical situations.     eGFR's persistently <90 mL/min signify possible Chronic Kidney     Disease.   Anion gap 12  5 - 15  HEPATIC FUNCTION PANEL     Status: Abnormal   Collection Time    06/16/14  6:50 AM      Result Value Ref Range   Total Protein 7.2  6.0 - 8.3 g/dL   Albumin 3.5  3.5 - 5.2 g/dL   AST 33  0 - 37 U/L   ALT 254 (*) 0 - 53 U/L   Alkaline Phosphatase 55  39 - 117 U/L   Total Bilirubin 1.6 (*) 0.3 - 1.2 mg/dL   Bilirubin, Direct 0.6 (*) 0.0 - 0.3 mg/dL   Indirect Bilirubin 1.0 (*) 0.3 - 0.9 mg/dL  GLUCOSE, CAPILLARY     Status: Abnormal   Collection Time    06/16/14  8:21 AM      Result Value Ref Range   Glucose-Capillary 123 (*) 70 - 99 mg/dL   Comment 1 Notify RN    GLUCOSE, CAPILLARY     Status: Abnormal   Collection Time    06/16/14 11:42 AM      Result Value Ref Range   Glucose-Capillary 334 (*) 70 - 99 mg/dL   Comment 1 Notify RN    GLUCOSE, CAPILLARY      Status: Abnormal   Collection Time    06/16/14  4:37 PM      Result Value Ref Range   Glucose-Capillary 331 (*) 70 - 99 mg/dL   Comment 1 Notify RN    GLUCOSE, CAPILLARY     Status: Abnormal   Collection Time    06/16/14  8:25 PM      Result Value Ref Range   Glucose-Capillary 300 (*) 70 - 99 mg/dL   Comment 1 Documented in Chart     Comment 2 Notify RN    GLUCOSE, CAPILLARY     Status: Abnormal   Collection Time    06/17/14 12:56 AM      Result Value Ref Range   Glucose-Capillary 248 (*) 70 - 99 mg/dL   Comment 1 Documented in Chart     Comment 2 Notify RN    GLUCOSE, CAPILLARY     Status: Abnormal   Collection Time    06/17/14  4:07 AM      Result Value Ref Range   Glucose-Capillary 141 (*) 70 - 99 mg/dL   Comment 1 Documented in Chart     Comment 2 Notify RN    HEPATIC FUNCTION PANEL     Status: Abnormal   Collection Time    06/17/14  5:57 AM      Result Value Ref Range   Total Protein 7.0  6.0 - 8.3 g/dL   Albumin 3.5  3.5 - 5.2 g/dL   AST 31  0 - 37 U/L   ALT 197 (*) 0 - 53 U/L   Alkaline Phosphatase 60  39 - 117 U/L   Total Bilirubin 1.4 (*) 0.3 - 1.2 mg/dL   Bilirubin, Direct 0.5 (*) 0.0 - 0.3 mg/dL  Indirect Bilirubin 0.9  0.3 - 0.9 mg/dL  BASIC METABOLIC PANEL     Status: Abnormal   Collection Time    06/17/14  5:57 AM      Result Value Ref Range   Sodium 143  137 - 147 mEq/L   Potassium 5.5 (*) 3.7 - 5.3 mEq/L   Chloride 97  96 - 112 mEq/L   CO2 36 (*) 19 - 32 mEq/L   Glucose, Bld 164 (*) 70 - 99 mg/dL   BUN 48 (*) 6 - 23 mg/dL   Creatinine, Ser 1.15  0.50 - 1.35 mg/dL   Calcium 9.7  8.4 - 10.5 mg/dL   GFR calc non Af Amer 60 (*) >90 mL/min   GFR calc Af Amer 70 (*) >90 mL/min   Comment: (NOTE)     The eGFR has been calculated using the CKD EPI equation.     This calculation has not been validated in all clinical situations.     eGFR's persistently <90 mL/min signify possible Chronic Kidney     Disease.   Anion gap 10  5 - 15  PROTIME-INR      Status: Abnormal   Collection Time    06/17/14  5:57 AM      Result Value Ref Range   Prothrombin Time 21.2 (*) 11.6 - 15.2 seconds   INR 1.83 (*) 0.00 - 1.49  GLUCOSE, CAPILLARY     Status: Abnormal   Collection Time    06/17/14  7:51 AM      Result Value Ref Range   Glucose-Capillary 158 (*) 70 - 99 mg/dL    ABGS No results found for this basename: PHART, PCO2, PO2ART, TCO2, HCO3,  in the last 72 hours CULTURES Recent Results (from the past 240 hour(s))  CULTURE, BLOOD (ROUTINE X 2)     Status: None   Collection Time    06/07/14 10:45 AM      Result Value Ref Range Status   Specimen Description BLOOD RIGHT FOREARM DRAWN BY RN [facility]   Final   Special Requests BOTTLES DRAWN AEROBIC ONLY 4CC   Final   Culture NO GROWTH 5 DAYS   Final   Report Status 06/12/2014 FINAL   Final  CULTURE, BLOOD (ROUTINE X 2)     Status: None   Collection Time    06/07/14 11:18 AM      Result Value Ref Range Status   Specimen Description BLOOD LEFT ARM   Final   Special Requests BOTTLES DRAWN AEROBIC AND ANAEROBIC [place]   Final   Culture NO GROWTH 5 DAYS   Final   Report Status 06/12/2014 FINAL   Final  URINE CULTURE     Status: None   Collection Time    06/07/14 12:30 PM      Result Value Ref Range Status   Specimen Description URINE, CATHETERIZED   Final   Special Requests NONE   Final   Culture  Setup Time     Final   Value: 06/07/2014 20:28     Performed at [facility]     Final   Value: NO GROWTH     Performed at [facility]   Culture     Final   Value: NO GROWTH     Performed at [facility]   Report Status 06/08/2014 FINAL   Final  MRSA PCR SCREENING     Status: None   Collection Time    06/07/14  2:05 PM      Result Value Ref Range Status   MRSA by PCR NEGATIVE  NEGATIVE Final   Comment:            The GeneXpert MRSA Assay (FDA     approved for NASAL specimens     only), is one component of a     comprehensive MRSA colonization      surveillance program. It is not     intended to diagnose MRSA     infection nor to guide or     monitor treatment for     MRSA infections.  CULTURE, EXPECTORATED SPUTUM-ASSESSMENT     Status: None   Collection Time    06/08/14  2:00 PM      Result Value Ref Range Status   Specimen Description SPUTUM EXPECTORATED   Final   Special Requests NONE   Final   Sputum evaluation     Final   Value: THIS SPECIMEN IS ACCEPTABLE. RESPIRATORY CULTURE REPORT TO FOLLOW.     Performed at [facility]   Report Status 06/09/2014 FINAL   Final  CULTURE, RESPIRATORY (NON-EXPECTORATED)     Status: None   Collection Time    06/08/14  2:00 PM      Result Value Ref Range Status   Specimen Description SPUTUM EXPECTORATED   Final   Special Requests NONE   Final   Gram Stain     Final   Value: MODERATE WBC PRESENT,BOTH PMN AND MONONUCLEAR     FEW SQUAMOUS EPITHELIAL CELLS PRESENT     RARE GRAM POSITIVE COCCI     IN PAIRS FEW YEAST     Performed at [facility]   Culture     Final   Value: MODERATE CANDIDA ALBICANS     Performed at [facility]   Report Status 06/11/2014 FINAL   Final  KOH PREP     Status: None   Collection Time    06/15/14  3:30 PM      Result Value Ref Range Status   Specimen Description ESOPHAGUS BRUSHING   Final   Special Requests NONE   Final   KOH Prep     Final   Value: YEAST WITH PSEUDOHYPHAE     Performed at [facility]   Report Status 06/15/2014 FINAL   Final   Studies/Results: No results found.  Medications:  Prior to Admission:  Prescriptions prior to admission  Medication Sig Dispense Refill  . apixaban (ELIQUIS) 5 MG TABS tablet Take 5 mg by mouth 2 (two) times daily.      Marland Kitchen aspirin EC 81 MG tablet Take 81 mg by mouth daily.      Marland Kitchen atorvastatin (LIPITOR) 20 MG tablet Take 10 mg by mouth daily.      Marland Kitchen BEE POLLEN PO Take 1 capsule by mouth daily.      . furosemide (LASIX) 20 MG tablet Take 20 mg by mouth every Monday, Wednesday, and  Friday.      . insulin detemir (LEVEMIR) 100 UNIT/ML injection Inject 0.2 mLs (20 Units total) into the skin daily.  10 mL  11  . latanoprost (XALATAN) 0.005 % ophthalmic solution Place 1 drop into the left eye at bedtime.      . magnesium oxide (MAG-OX) 400 MG tablet Take 400 mg by mouth daily at 6 PM.      . metFORMIN (GLUCOPHAGE) 500 MG tablet Take 1 tablet (500 mg total) by mouth 2 (two)  times daily with a meal.  60 tablet  1  . metoprolol tartrate (LOPRESSOR) 25 MG tablet Take 25 mg by mouth 2 (two) times daily.      . Nintedanib Esylate (OFEV) 150 MG CAPS Take 1 capsule by mouth every 12 (twelve) hours.      . tadalafil (CIALIS) 20 MG tablet Take 20 mg by mouth daily as needed for erectile dysfunction.       Scheduled: . antiseptic oral rinse  7 mL Mouth Rinse q12n4p  . apixaban  5 mg Oral BID  . atorvastatin  10 mg Oral QPM  . chlorhexidine  15 mL Mouth Rinse BID  . feeding supplement (PRO-STAT SUGAR FREE 64)  30 mL Oral BID BM  . fluconazole  100 mg Oral Daily  . furosemide  40 mg Oral Daily  . insulin aspart  0-9 Units Subcutaneous 6 times per day  . latanoprost  1 drop Left Eye QHS  . levalbuterol  0.63 mg Nebulization TID  . magnesium oxide  400 mg Oral q1800  . metoprolol tartrate  12.5 mg Oral BID  . Nintedanib Esylate  1 capsule Oral Q12H  . pantoprazole  40 mg Oral BID AC  . predniSONE  40 mg Oral Q breakfast   Continuous:  ULA:GTXMIWOEHOZYY, acetaminophen, albuterol, alum & mag hydroxide-simeth, HYDROcodone-acetaminophen, LORazepam, ondansetron (ZOFRAN) IV, ondansetron, senna-docusate, traZODone  Assesment: He was admitted with healthcare associated pneumonia probably related to aspiration acute respiratory failure and sepsis. The sepsis was complicated by elevated liver enzymes and acute renal failure. At baseline he has pulmonary fibrosis. Gastric distention and what appeared to be some gastric outlet obstruction and had EGD and dilatation. He seems much  better. Principal Problem:   Sepsis Active Problems:   Hypertension   Atrial fibrillation   Acute on chronic diastolic heart failure   Pulmonary fibrosis   Acute renal failure   DM type 2 (diabetes mellitus, type 2)   HCAP (healthcare-associated pneumonia)   Acute respiratory failure with hypoxia   Hyperkalemia   Elevated transaminase level   Gastric distention    Plan: As far as sending him home on OFEV since he said it made him feel bad and he had GI side effects although the typical GI side effects are more related to diarrhea I think it's okay to hold that until he makes it to the [facility] pulmonary fibrosis clinic and see if they want him to be on this. 4 prednisone dosage I would have him on 40 mg or 5 days, 30 mg for 5 days 20 mg for 5 days and then 10 mg indefinitely until he is seen at the [facility] pulmonary fibrosis clinic.    LOS: 10 days   Millie Forde L 06/17/2014, 8:32 AM

## 2014-06-18 ENCOUNTER — Encounter (HOSPITAL_COMMUNITY): Payer: Self-pay | Admitting: Gastroenterology

## 2014-06-18 LAB — GLUCOSE, CAPILLARY: Glucose-Capillary: 197 mg/dL — ABNORMAL HIGH (ref 70–99)

## 2014-07-15 ENCOUNTER — Telehealth: Payer: Self-pay | Admitting: Gastroenterology

## 2014-07-15 MED ORDER — PANTOPRAZOLE SODIUM 40 MG PO TBEC: DELAYED_RELEASE_TABLET | ORAL | Status: AC

## 2014-07-15 NOTE — Telephone Encounter (Signed)
LMOM to call.

## 2014-07-15 NOTE — Telephone Encounter (Signed)
Please call pt. HIS stomach Bx shows gastritis DUE TO STEROIDS. CONTINUE PROTONIX 30 MINS PRIOR TO BREAKFAST ONCE DAILY. OPV IN 3 MOS E30 DYSPHAGIA, GASTRITIS.

## 2014-07-15 NOTE — Telephone Encounter (Signed)
PATIENT NIC'D  °

## 2014-07-16 NOTE — Telephone Encounter (Signed)
LMOM to call and letter mailed to call.  

## 2014-07-26 ENCOUNTER — Telehealth: Payer: Self-pay | Admitting: Gastroenterology

## 2014-07-26 NOTE — Telephone Encounter (Signed)
Pt's son called to say that patient received a letter from DS and to call ASAP.  I told him that DS would be back in the office tomorrow and he wanted to know what this letter was about.  I told him that DS had been trying to reach him to let him know what SF had recommended. Please call son at [phone] and he said that patient is no longer taking steroids.

## 2014-07-27 NOTE — Telephone Encounter (Signed)
I spoke to pt and he is aware of results.

## 2014-08-23 ENCOUNTER — Telehealth: Payer: Self-pay | Admitting: Internal Medicine

## 2014-08-23 NOTE — Telephone Encounter (Signed)
Mr. [name] Sherman is not our patient. Records indicate that his [place] [facility] [place]. We have contacted that office and they indicate he is their pt. We informed them of call from Hospice. Sharlet SalinaMary John Baxley, MD

## 2014-08-23 NOTE — Telephone Encounter (Signed)
[facility]  Advising Mr. Ivette LoyalGwynn died December 31, 2013.  States records requested Dr. Lenord FellersBaxley be advised.

## 2014-08-24 ENCOUNTER — Encounter: Payer: Self-pay | Admitting: Gastroenterology

## 2014-09-14 DEATH — deceased

## 2014-09-23 ENCOUNTER — Telehealth: Payer: Self-pay | Admitting: Gastroenterology

## 2014-09-23 NOTE — Telephone Encounter (Signed)
Received a phone call 09/23/14 that stated that patient had passed away.

## 2014-09-23 NOTE — Telephone Encounter (Signed)
NOTED

## 2014-09-29 NOTE — Telephone Encounter (Signed)
REVIEWED.  

## 2015-05-12 IMAGING — CT CT CHEST W/O CM
2 of 3 series · 11 of 36 positions shown, 13 images · non-contrast
Comparison: Chest radiograph 06/07/2014 [address] CT chest without
contrast and high-resolution images on 12/11/2013

CLINICAL DATA: Acute respiratory failure. Evaluate for pneumonia
versus edema.

EXAM:
CT CHEST, ABDOMEN AND PELVIS WITHOUT CONTRAST
TECHNIQUE: Multidetector CT imaging of the chest, abdomen and pelvis was
performed following the standard protocol without IV contrast.

[Series 2: cap w/o 5.0 b40f · axial · non-contrast · 0.74mm/px · z∈[+615,+1160]mm · 8 of 129 slices shown, 10 images]
[im 10/129  mediastinal]
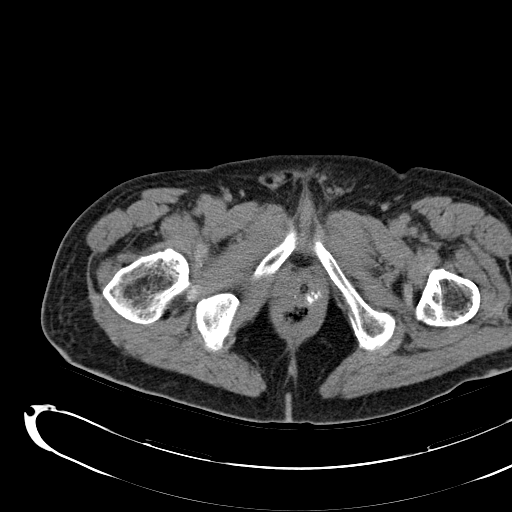
[im 10/129  lung]
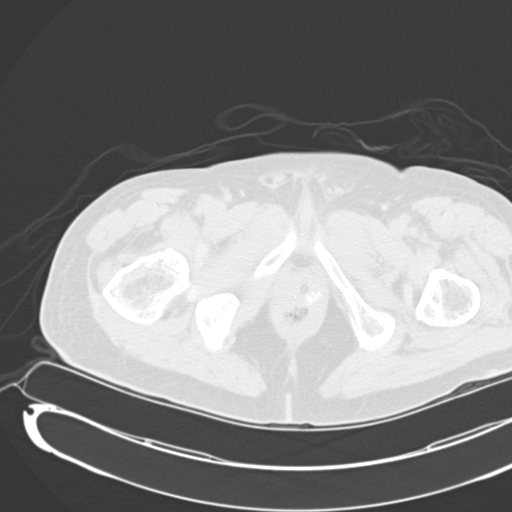
[im 24/129  lung]
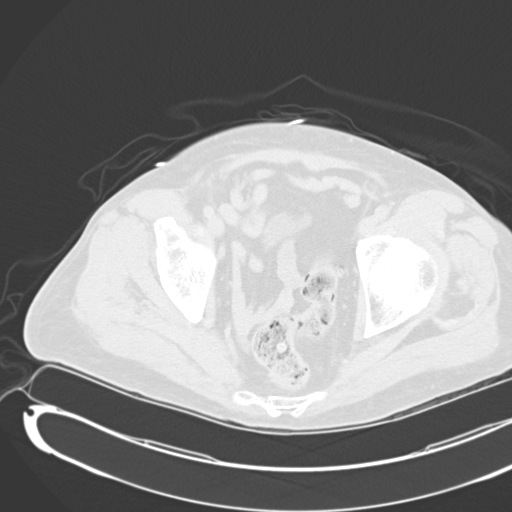
[im 43/129  lung]
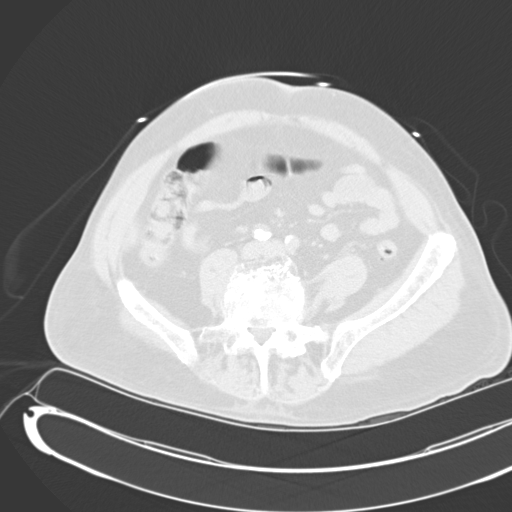
[im 57/129  lung]
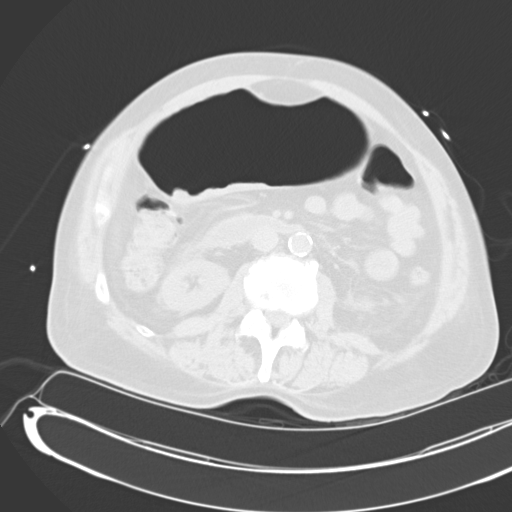
[im 72/129  mediastinal]
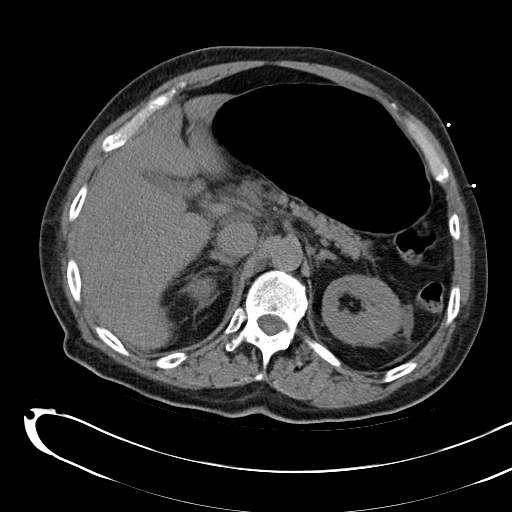
[im 72/129  lung]
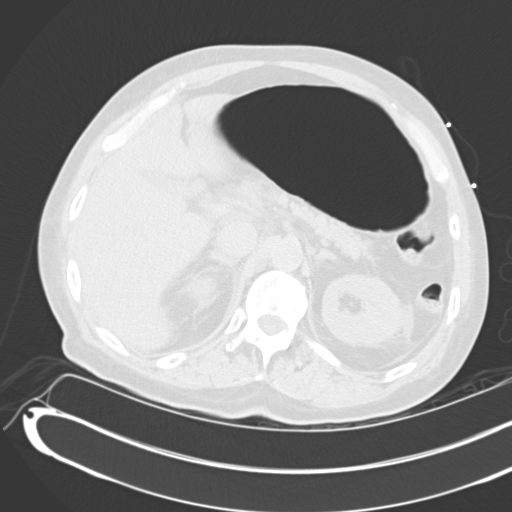
[im 86/129  lung]
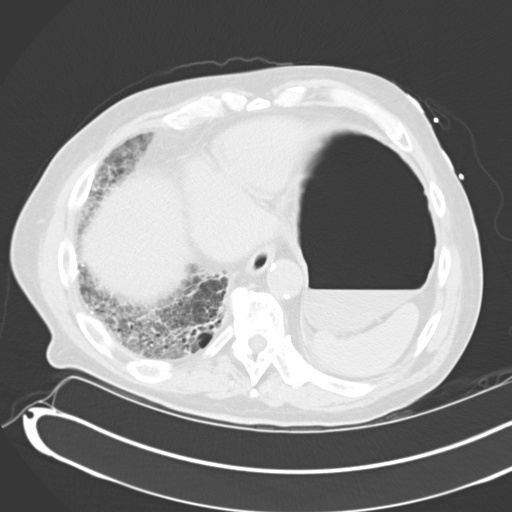
[im 105/129  lung]
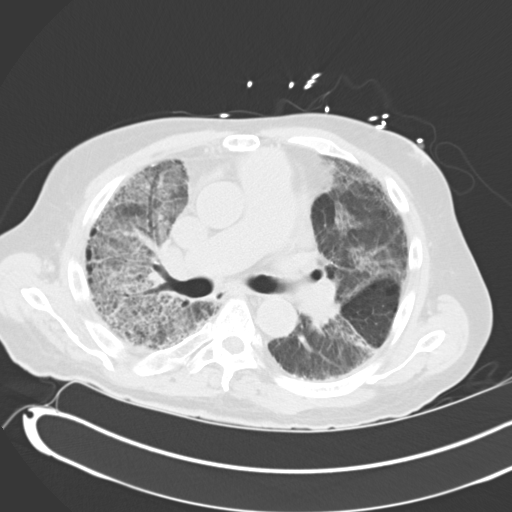
[im 119/129  lung]
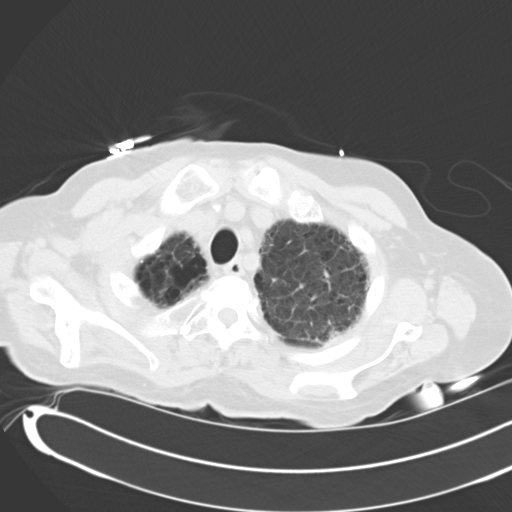

[Series 3: mpr coro cap (id) · coronal · 0.73mm/px · 3 of 89 slices shown]
[im 18/89  lung]
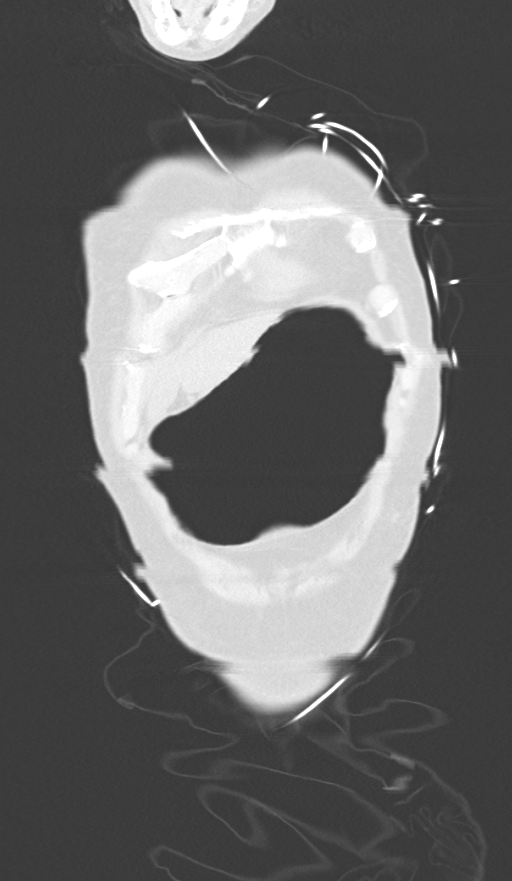
[im 36/89  lung]
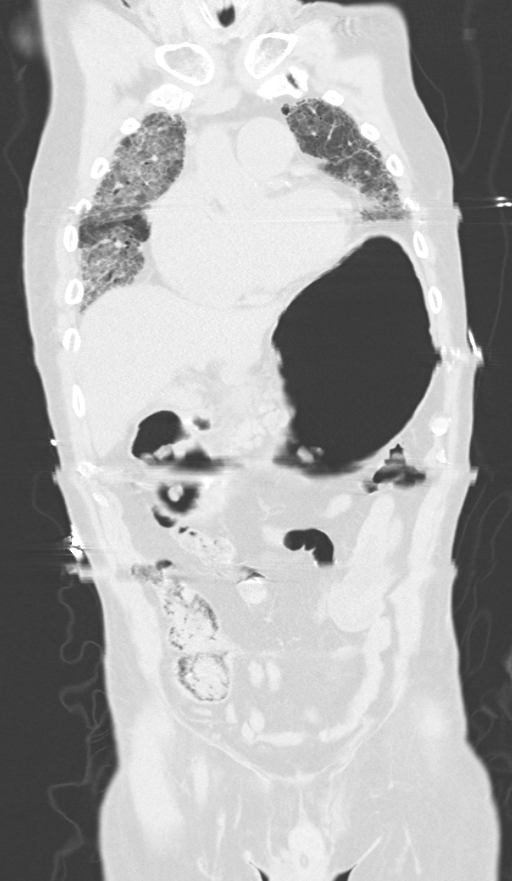
[im 53/89  lung]
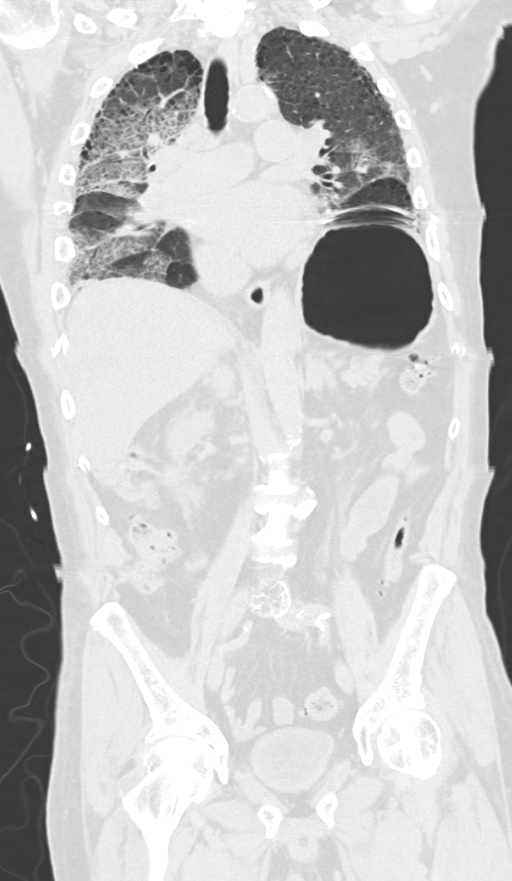

[11 of 36 positions shown; findings below may reference images not displayed]

FINDINGS: CT CHEST FINDINGS

Cardiomegaly with biatrial enlargement appears similar to prior
chest CT. Negative for pericardial effusion. Atherosclerotic
calcification of the normal caliber thoracic aorta, proximal great
vessels, left anterior descending coronary artery and right coronary
artery are stable. Esophagus is unremarkable. A prevascular lymph
node is mildly prominent measuring 7.6 mm (previously 7 mm).
Precarinal lymphadenopathy has progressed since prior chest CT of
12/11/2013. This prominent lymph node currently measures 2.1 cm AP
diameter (previously 1.6 cm). Subcarinal lymph node is now 14 mm AP
diameter (previously 10 mm).

There is a trace amount of pleural fluid posteriorly on the left. No
pleural effusion on the right.

The patient has chronic underlying interstitial lung disease as
described on the high-resolution chest CT dated 12/11/2013 in a
pattern most consistent with usual interstitial pneumonitis. There
has been a significant interval change in appearance of the lungs
since the prior chest CT of 12/11/2013. There are now extensive
multifocal bilateral patchy areas of airspace disease, right greater
than left, superimposed on the chronic interstitial lung disease.
This manifests as extensive ground-glass opacities bilaterally with
focal areas of sparing. Lung apices are fairly spared. There is no
intralobular septal thickening to suggest pulmonary edema. The
trachea can't mainstem bronchi are patent. Negative for
pneumothorax. Lung volumes are slightly low. There is some
respiratory motion artifact at the level of the mid chest. This
results in an artifactually abnormal appearance of the sternum on
the reformatted sagittal views. Thoracic spine vertebral bodies are
normal in height and alignment.

Densely sclerotic lesion in the posterior right second rib is
stable. Densely sclerotic lesion in the posterior right fifth rib is
also stable. The sclerotic lesions are well-circumscribed and appear
benign.

CT ABDOMEN AND PELVIS FINDINGS

There is marked gaseous distention of the stomach. Gastric wall
thickness appears normal. There is an air-fluid level in the fundus
of the stomach and another air-fluid level in the pylorus/duodenal
bulb. Gastric outlet obstruction cannot be excluded.

Small bowel loops and colon are normal in caliber.

The noncontrast appearance of the liver, spleen, pancreas, and
kidneys is within normal limits. Negative for hydronephrosis. There
is bilateral perinephric stranding, nonspecific finding. Both
ureters are normal in caliber. No urinary tract stone disease is
seen.

There is bilateral thickening of the adrenal glands without discrete
nodules or masses.

There is heavy atherosclerotic calcification of the normal caliber
abdominal aorta. Scattered atherosclerotic calcification of the
iliac vasculature without aneurysm.

Normal appendix.

There is a amorphous high density within the gallbladder lumen. This
was not present on prior chest CT. This could reflect gallbladder
sludge and/or small gallstones.

Urinary bladder decompressed by Foley catheter. There is some air
within the urinary bladder that is likely secondary to the presence
of the Foley. Prostate gland appears within normal limits for size
and contains some internal calcifications. Small amount of free
fluid is noted in the dependent portion the pelvis. Negative for
free intraperitoneal air or lymphadenopathy in the abdomen or
pelvis.

There is a mild convex left in the curvature of the upper to mid
lumbar spine and a mild convex right curvature at the L4-L5 level.
There is partial fusion of the L4-L5 vertebral bodies along the left
lateral aspect, nonsurgical. Significant disc space narrowing at
L4-5 and L5-S1. No suspicious osseous lesions.
IMPRESSION: 1. Extensive bilateral airspace disease superimposed on the
patient's chronic interstitial lung disease (see high-resolution
chest CT performed in November 2013). Findings could reflect an
acute exacerbation of usual interstitial pneumonitis or could
reflect acute multifocal bilateral infectious pneumonia superimposed
on chronic interstitial lung disease. There is a trace left pleural
effusion.
2. Progression of reactive mediastinal lymphadenopathy, likely
secondary to the current acute lung pathology.
3. Marked gaseous distention of the stomach. This could place the
patient at increased risk for aspiration. Gastric outlet obstruction
cannot be completely excluded. Findings were discussed by telephone
with the patient's nurse Staebler, at [DATE] p.m., 06/07/2014.
4. Stable cardiomegaly with biatrial enlargement.
5. Extensive atherosclerosis, including the coronary arteries.
6. Amorphous high density within the gallbladder lumen for which
gallbladder sludge and/or small stones cannot be excluded.

## 2015-05-17 IMAGING — CR DG CHEST 1V PORT
1 series · 1 of 1 positions shown · non-contrast
Comparison: 06/11/2014.

CLINICAL DATA: Followup chest radiograph.  Pneumonia.

EXAM:
PORTABLE CHEST - 1 VIEW

[portable]
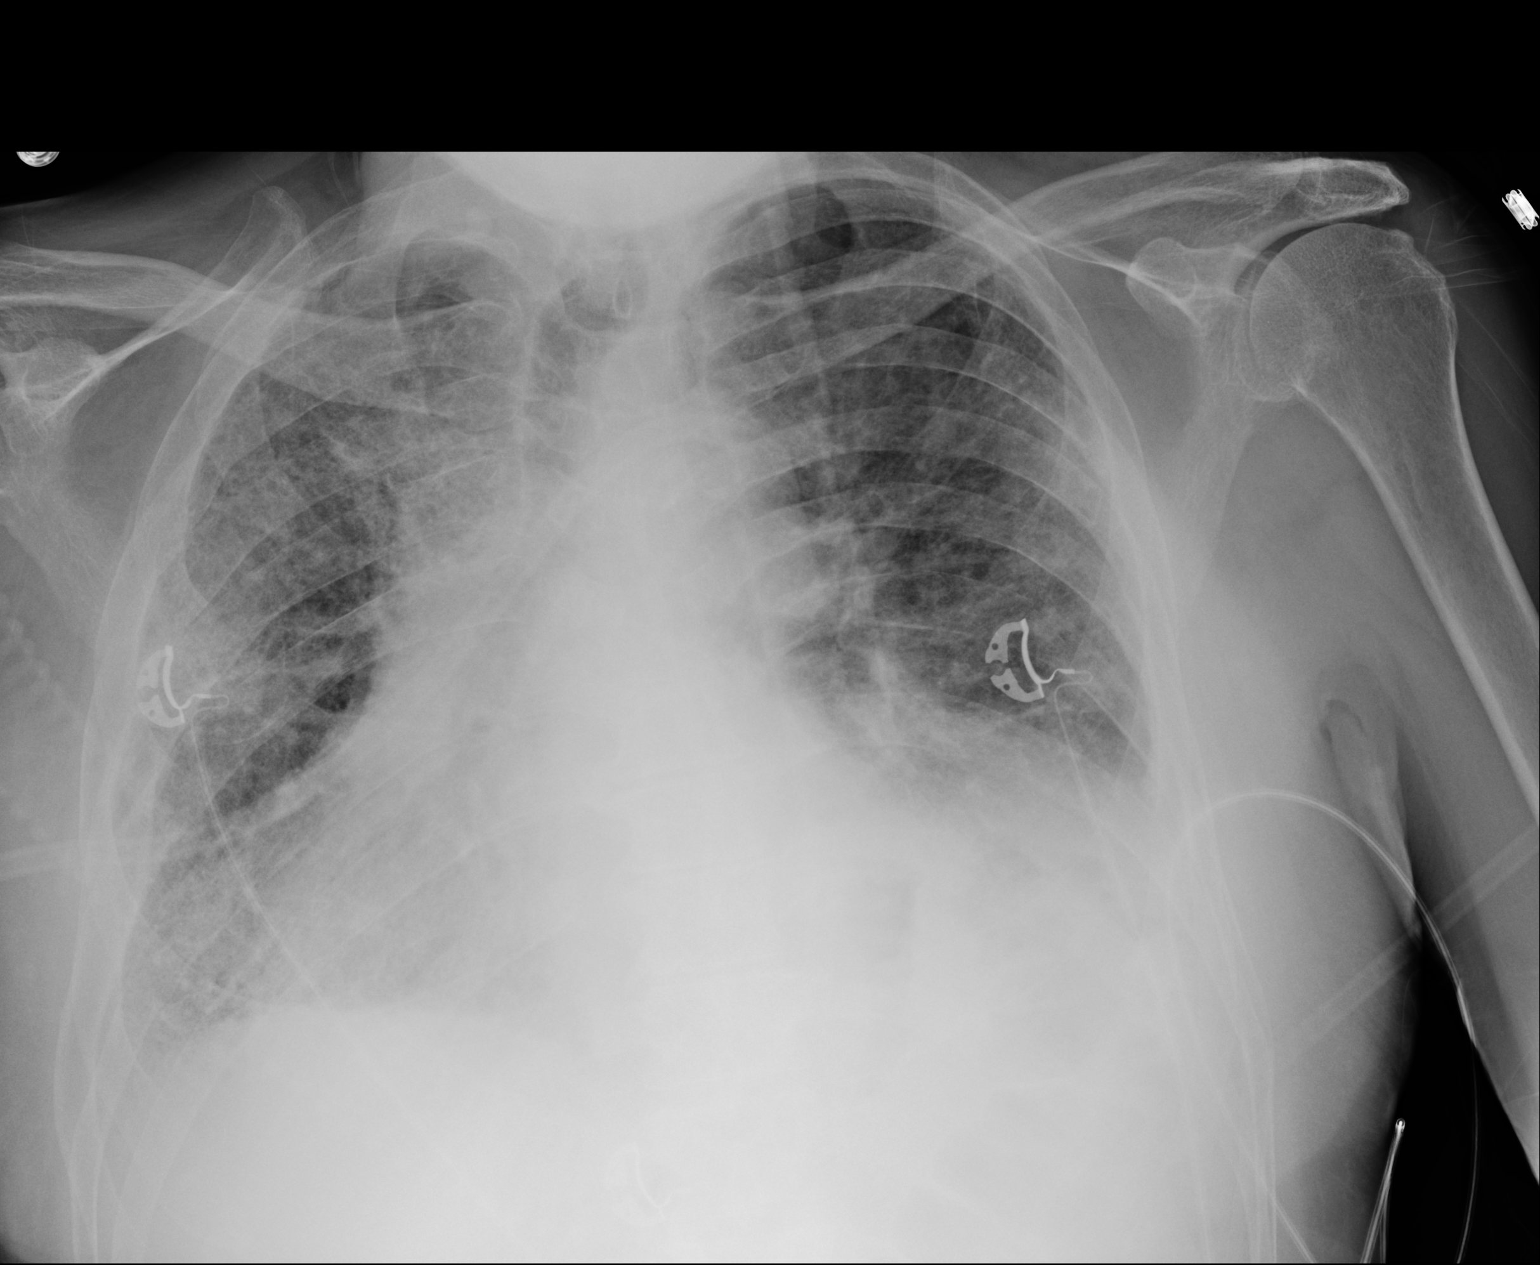

[1 of 1 positions shown; findings below may reference images not displayed]

FINDINGS: Bilateral irregular interstitial thickening with heterogeneous areas
of airspace opacity are stable. Stable cardiomegaly. No
pneumothorax.
IMPRESSION: No change from the previous day's study.
